# Patient Record
Sex: Female | Born: 1960
Health system: Southern US, Community
[De-identification: ages and names within clinical notes are randomized; demographics above are authoritative.]

## PROBLEM LIST (undated history)

## (undated) DIAGNOSIS — Z9884 Bariatric surgery status: Secondary | ICD-10-CM

## (undated) DIAGNOSIS — K649 Unspecified hemorrhoids: Secondary | ICD-10-CM

## (undated) DIAGNOSIS — IMO0002 Reserved for concepts with insufficient information to code with codable children: Secondary | ICD-10-CM

## (undated) DIAGNOSIS — E079 Disorder of thyroid, unspecified: Secondary | ICD-10-CM

## (undated) DIAGNOSIS — K5790 Diverticulosis of intestine, part unspecified, without perforation or abscess without bleeding: Secondary | ICD-10-CM

## (undated) HISTORY — DX: Reserved for concepts with insufficient information to code with codable children: IMO0002

## (undated) HISTORY — PX: REDUCTION MAMMAPLASTY: SUR839

## (undated) HISTORY — PX: DILATION AND CURETTAGE OF UTERUS: SHX78

## (undated) HISTORY — PX: HEEL SPUR SURGERY: SHX665

## (undated) HISTORY — PX: OTHER SURGICAL HISTORY: SHX169

## (undated) HISTORY — PX: APPENDECTOMY: SHX54

## (undated) HISTORY — DX: Unspecified hemorrhoids: K64.9

## (undated) HISTORY — DX: Disorder of thyroid, unspecified: E07.9

## (undated) HISTORY — PX: TOTAL ABDOMINAL HYSTERECTOMY: SHX209

## (undated) HISTORY — DX: Diverticulosis of intestine, part unspecified, without perforation or abscess without bleeding: K57.90

## (undated) HISTORY — DX: Bariatric surgery status: Z98.84

## (undated) HISTORY — PX: NEUROMA SURGERY: SHX722

---

## 2003-10-03 ENCOUNTER — Ambulatory Visit (HOSPITAL_COMMUNITY): Admission: RE | Admit: 2003-10-03 | Discharge: 2003-10-03 | Payer: Self-pay | Admitting: *Deleted

## 2003-10-05 ENCOUNTER — Ambulatory Visit (HOSPITAL_COMMUNITY): Admission: RE | Admit: 2003-10-05 | Discharge: 2003-10-05 | Payer: Self-pay | Admitting: *Deleted

## 2003-10-23 ENCOUNTER — Encounter: Admission: RE | Admit: 2003-10-23 | Discharge: 2004-01-21 | Payer: Self-pay | Admitting: *Deleted

## 2003-12-10 ENCOUNTER — Inpatient Hospital Stay (HOSPITAL_COMMUNITY): Admission: RE | Admit: 2003-12-10 | Discharge: 2003-12-12 | Payer: Self-pay | Admitting: *Deleted

## 2004-01-22 ENCOUNTER — Encounter: Admission: RE | Admit: 2004-01-22 | Discharge: 2004-04-21 | Payer: Self-pay | Admitting: *Deleted

## 2004-05-01 ENCOUNTER — Ambulatory Visit (HOSPITAL_COMMUNITY): Admission: RE | Admit: 2004-05-01 | Discharge: 2004-05-01 | Payer: Self-pay | Admitting: *Deleted

## 2004-05-20 ENCOUNTER — Ambulatory Visit (HOSPITAL_COMMUNITY): Admission: RE | Admit: 2004-05-20 | Discharge: 2004-05-20 | Payer: Self-pay | Admitting: *Deleted

## 2004-05-27 ENCOUNTER — Ambulatory Visit (HOSPITAL_COMMUNITY): Admission: RE | Admit: 2004-05-27 | Discharge: 2004-05-27 | Payer: Self-pay | Admitting: Surgery

## 2004-05-27 ENCOUNTER — Encounter: Admission: RE | Admit: 2004-05-27 | Discharge: 2004-08-25 | Payer: Self-pay | Admitting: *Deleted

## 2004-09-10 ENCOUNTER — Encounter: Admission: RE | Admit: 2004-09-10 | Discharge: 2004-12-09 | Payer: Self-pay | Admitting: *Deleted

## 2004-12-10 ENCOUNTER — Encounter: Admission: RE | Admit: 2004-12-10 | Discharge: 2005-01-25 | Payer: Self-pay | Admitting: *Deleted

## 2004-12-15 ENCOUNTER — Ambulatory Visit: Payer: Self-pay | Admitting: Family Medicine

## 2005-01-07 ENCOUNTER — Ambulatory Visit: Payer: Self-pay | Admitting: Family Medicine

## 2005-01-07 ENCOUNTER — Other Ambulatory Visit: Admission: RE | Admit: 2005-01-07 | Discharge: 2005-01-07 | Payer: Self-pay | Admitting: Family Medicine

## 2005-01-07 ENCOUNTER — Encounter (INDEPENDENT_AMBULATORY_CARE_PROVIDER_SITE_OTHER): Payer: Self-pay | Admitting: *Deleted

## 2005-01-16 ENCOUNTER — Ambulatory Visit: Payer: Self-pay | Admitting: Gastroenterology

## 2005-01-28 ENCOUNTER — Ambulatory Visit: Payer: Self-pay | Admitting: Gastroenterology

## 2005-01-28 ENCOUNTER — Encounter (INDEPENDENT_AMBULATORY_CARE_PROVIDER_SITE_OTHER): Payer: Self-pay | Admitting: *Deleted

## 2005-01-28 LAB — HM COLONOSCOPY

## 2005-02-03 ENCOUNTER — Ambulatory Visit: Payer: Self-pay | Admitting: Family Medicine

## 2005-03-03 ENCOUNTER — Ambulatory Visit: Payer: Self-pay | Admitting: Family Medicine

## 2005-03-31 ENCOUNTER — Ambulatory Visit: Payer: Self-pay | Admitting: Family Medicine

## 2005-05-26 ENCOUNTER — Encounter: Payer: Self-pay | Admitting: Family Medicine

## 2005-07-13 ENCOUNTER — Ambulatory Visit: Payer: Self-pay | Admitting: Family Medicine

## 2005-07-16 ENCOUNTER — Encounter: Admission: RE | Admit: 2005-07-16 | Discharge: 2005-07-16 | Payer: Self-pay | Admitting: Family Medicine

## 2005-11-03 DIAGNOSIS — F339 Major depressive disorder, recurrent, unspecified: Secondary | ICD-10-CM | POA: Insufficient documentation

## 2005-11-03 DIAGNOSIS — K5732 Diverticulitis of large intestine without perforation or abscess without bleeding: Secondary | ICD-10-CM | POA: Insufficient documentation

## 2005-11-03 DIAGNOSIS — E7800 Pure hypercholesterolemia, unspecified: Secondary | ICD-10-CM | POA: Insufficient documentation

## 2005-11-03 DIAGNOSIS — I8390 Asymptomatic varicose veins of unspecified lower extremity: Secondary | ICD-10-CM | POA: Insufficient documentation

## 2006-01-26 HISTORY — PX: HERNIA REPAIR: SHX51

## 2006-02-04 ENCOUNTER — Encounter: Payer: Self-pay | Admitting: Family Medicine

## 2006-02-04 ENCOUNTER — Ambulatory Visit: Payer: Self-pay | Admitting: Family Medicine

## 2006-02-04 DIAGNOSIS — M25519 Pain in unspecified shoulder: Secondary | ICD-10-CM | POA: Insufficient documentation

## 2006-02-05 ENCOUNTER — Encounter: Payer: Self-pay | Admitting: Family Medicine

## 2006-02-05 ENCOUNTER — Telehealth: Payer: Self-pay | Admitting: Family Medicine

## 2006-02-08 LAB — CONVERTED CEMR LAB
Albumin: 4 g/dL (ref 3.5–5.2)
Alkaline Phosphatase: 102 units/L (ref 39–117)
CO2: 25 meq/L (ref 19–32)
Calcium: 9.1 mg/dL (ref 8.4–10.5)
Chloride: 106 meq/L (ref 96–112)
Glucose, Bld: 85 mg/dL (ref 70–99)
LDL Cholesterol: 114 mg/dL — ABNORMAL HIGH (ref 0–99)
Potassium: 4.3 meq/L (ref 3.5–5.3)
Sodium: 139 meq/L (ref 135–145)
Total Protein: 7 g/dL (ref 6.0–8.3)
Triglycerides: 124 mg/dL (ref <150)

## 2006-02-12 ENCOUNTER — Ambulatory Visit: Payer: Self-pay | Admitting: Family Medicine

## 2006-02-12 ENCOUNTER — Other Ambulatory Visit: Admission: RE | Admit: 2006-02-12 | Discharge: 2006-02-12 | Payer: Self-pay | Admitting: Family Medicine

## 2006-02-12 ENCOUNTER — Encounter (INDEPENDENT_AMBULATORY_CARE_PROVIDER_SITE_OTHER): Payer: Self-pay | Admitting: Specialist

## 2006-02-12 ENCOUNTER — Encounter: Payer: Self-pay | Admitting: Family Medicine

## 2006-02-18 ENCOUNTER — Ambulatory Visit: Payer: Self-pay | Admitting: Family Medicine

## 2006-02-22 ENCOUNTER — Encounter: Payer: Self-pay | Admitting: Family Medicine

## 2006-02-24 ENCOUNTER — Encounter: Payer: Self-pay | Admitting: Family Medicine

## 2006-04-06 ENCOUNTER — Ambulatory Visit: Payer: Self-pay | Admitting: Family Medicine

## 2006-04-06 DIAGNOSIS — J309 Allergic rhinitis, unspecified: Secondary | ICD-10-CM | POA: Insufficient documentation

## 2006-04-23 ENCOUNTER — Telehealth: Payer: Self-pay | Admitting: Family Medicine

## 2006-05-05 ENCOUNTER — Ambulatory Visit: Payer: Self-pay | Admitting: Family Medicine

## 2006-05-06 ENCOUNTER — Ambulatory Visit: Payer: Self-pay | Admitting: Family Medicine

## 2006-05-06 DIAGNOSIS — K4090 Unilateral inguinal hernia, without obstruction or gangrene, not specified as recurrent: Secondary | ICD-10-CM | POA: Insufficient documentation

## 2006-05-10 ENCOUNTER — Ambulatory Visit (HOSPITAL_BASED_OUTPATIENT_CLINIC_OR_DEPARTMENT_OTHER): Admission: RE | Admit: 2006-05-10 | Discharge: 2006-05-10 | Payer: Self-pay | Admitting: Specialist

## 2006-06-30 ENCOUNTER — Ambulatory Visit: Payer: Self-pay | Admitting: Family Medicine

## 2006-06-30 LAB — CONVERTED CEMR LAB
Glucose, Urine, Semiquant: NEGATIVE
Ketones, urine, test strip: NEGATIVE
Nitrite: NEGATIVE
Urobilinogen, UA: 0.2

## 2006-08-31 ENCOUNTER — Ambulatory Visit: Payer: Self-pay | Admitting: Family Medicine

## 2006-09-01 ENCOUNTER — Telehealth (INDEPENDENT_AMBULATORY_CARE_PROVIDER_SITE_OTHER): Payer: Self-pay | Admitting: *Deleted

## 2006-12-01 ENCOUNTER — Ambulatory Visit: Payer: Self-pay | Admitting: Family Medicine

## 2006-12-01 DIAGNOSIS — E049 Nontoxic goiter, unspecified: Secondary | ICD-10-CM | POA: Insufficient documentation

## 2006-12-01 DIAGNOSIS — J328 Other chronic sinusitis: Secondary | ICD-10-CM | POA: Insufficient documentation

## 2006-12-07 ENCOUNTER — Telehealth: Payer: Self-pay | Admitting: Family Medicine

## 2006-12-07 ENCOUNTER — Encounter: Payer: Self-pay | Admitting: Family Medicine

## 2006-12-07 LAB — CONVERTED CEMR LAB
Free T4: 0.92 ng/dL (ref 0.89–1.80)
T3, Free: 2.6 pg/mL (ref 2.3–4.2)

## 2006-12-08 ENCOUNTER — Encounter: Admission: RE | Admit: 2006-12-08 | Discharge: 2006-12-08 | Payer: Self-pay | Admitting: Family Medicine

## 2006-12-09 ENCOUNTER — Telehealth (INDEPENDENT_AMBULATORY_CARE_PROVIDER_SITE_OTHER): Payer: Self-pay | Admitting: *Deleted

## 2007-03-16 ENCOUNTER — Encounter: Payer: Self-pay | Admitting: Family Medicine

## 2007-03-16 ENCOUNTER — Other Ambulatory Visit: Admission: RE | Admit: 2007-03-16 | Discharge: 2007-03-16 | Payer: Self-pay | Admitting: Family Medicine

## 2007-03-16 ENCOUNTER — Ambulatory Visit: Payer: Self-pay | Admitting: Family Medicine

## 2007-03-16 DIAGNOSIS — Z9884 Bariatric surgery status: Secondary | ICD-10-CM | POA: Insufficient documentation

## 2007-03-17 ENCOUNTER — Encounter: Payer: Self-pay | Admitting: Family Medicine

## 2007-03-17 LAB — CONVERTED CEMR LAB
ALT: <8 units/L (ref 0–35)
AST: 16 units/L (ref 0–37)
Albumin: 4.2 g/dL (ref 3.5–5.2)
Calcium: 9.2 mg/dL (ref 8.4–10.5)
Chloride: 108 meq/L (ref 96–112)
Ferritin: 29 ng/mL (ref 10–291)
Hemoglobin: 13 g/dL (ref 12.0–15.0)
Platelets: 269 10*3/uL (ref 150–400)
Potassium: 4.2 meq/L (ref 3.5–5.3)
RDW: 13.4 % (ref 11.5–15.5)
Total Protein: 6.9 g/dL (ref 6.0–8.3)
Vitamin B-12: 484 pg/mL (ref 211–911)

## 2007-03-21 ENCOUNTER — Encounter: Payer: Self-pay | Admitting: Family Medicine

## 2007-03-22 ENCOUNTER — Telehealth (INDEPENDENT_AMBULATORY_CARE_PROVIDER_SITE_OTHER): Payer: Self-pay | Admitting: *Deleted

## 2007-03-22 ENCOUNTER — Encounter: Admission: RE | Admit: 2007-03-22 | Discharge: 2007-03-22 | Payer: Self-pay | Admitting: Family Medicine

## 2007-03-22 ENCOUNTER — Ambulatory Visit: Payer: Self-pay | Admitting: Family Medicine

## 2007-03-22 LAB — CONVERTED CEMR LAB: Pap Smear: NORMAL

## 2007-03-24 ENCOUNTER — Encounter: Admission: RE | Admit: 2007-03-24 | Discharge: 2007-03-24 | Payer: Self-pay | Admitting: Family Medicine

## 2007-04-19 ENCOUNTER — Telehealth: Payer: Self-pay | Admitting: Family Medicine

## 2007-05-31 ENCOUNTER — Ambulatory Visit: Payer: Self-pay | Admitting: Family Medicine

## 2007-06-01 ENCOUNTER — Encounter: Payer: Self-pay | Admitting: Family Medicine

## 2007-06-01 LAB — CONVERTED CEMR LAB
Clue Cells Wet Prep HPF POC: NONE SEEN
Trich, Wet Prep: NONE SEEN

## 2007-06-02 ENCOUNTER — Encounter: Admission: RE | Admit: 2007-06-02 | Discharge: 2007-06-02 | Payer: Self-pay | Admitting: Family Medicine

## 2007-06-02 ENCOUNTER — Ambulatory Visit: Payer: Self-pay | Admitting: Family Medicine

## 2007-06-02 LAB — CONVERTED CEMR LAB
Nitrite: NEGATIVE
Specific Gravity, Urine: 1.01
Urobilinogen, UA: 0.2
pH: 7.5

## 2007-06-03 ENCOUNTER — Encounter: Payer: Self-pay | Admitting: Family Medicine

## 2007-06-03 LAB — CONVERTED CEMR LAB
ALT: 14 units/L (ref 0–35)
AST: 33 units/L (ref 0–37)
Albumin: 3.4 g/dL — ABNORMAL LOW (ref 3.5–5.2)
BUN: 6 mg/dL (ref 6–23)
Calcium: 8.8 mg/dL (ref 8.4–10.5)
Chloride: 102 meq/L (ref 96–112)
HCT: 37.1 % (ref 36.0–46.0)
MCHC: 33.5 g/dL (ref 30.0–36.0)
MCV: 88 fL (ref 78.0–100.0)
Monocytes Relative: 3 % (ref 3–12)
Neutrophils Relative %: 74 % (ref 43–77)
Platelets: 237 10*3/uL (ref 150–400)
Potassium: 3.9 meq/L (ref 3.5–5.3)
RBC: 4.21 M/uL (ref 3.87–5.11)
Sodium: 136 meq/L (ref 135–145)
Total Protein: 6.5 g/dL (ref 6.0–8.3)

## 2007-06-06 ENCOUNTER — Encounter: Admission: RE | Admit: 2007-06-06 | Discharge: 2007-07-15 | Payer: Self-pay | Admitting: Family Medicine

## 2007-06-07 ENCOUNTER — Telehealth (INDEPENDENT_AMBULATORY_CARE_PROVIDER_SITE_OTHER): Payer: Self-pay | Admitting: *Deleted

## 2007-06-10 ENCOUNTER — Encounter: Payer: Self-pay | Admitting: Family Medicine

## 2007-09-12 ENCOUNTER — Encounter: Payer: Self-pay | Admitting: Family Medicine

## 2007-10-24 ENCOUNTER — Telehealth (INDEPENDENT_AMBULATORY_CARE_PROVIDER_SITE_OTHER): Payer: Self-pay | Admitting: *Deleted

## 2007-10-24 ENCOUNTER — Ambulatory Visit: Payer: Self-pay | Admitting: Family Medicine

## 2007-10-24 DIAGNOSIS — K644 Residual hemorrhoidal skin tags: Secondary | ICD-10-CM | POA: Insufficient documentation

## 2007-10-24 DIAGNOSIS — R599 Enlarged lymph nodes, unspecified: Secondary | ICD-10-CM | POA: Insufficient documentation

## 2007-11-02 ENCOUNTER — Ambulatory Visit: Payer: Self-pay | Admitting: Family Medicine

## 2007-11-02 DIAGNOSIS — E538 Deficiency of other specified B group vitamins: Secondary | ICD-10-CM | POA: Insufficient documentation

## 2007-11-03 LAB — CONVERTED CEMR LAB
ALT: 8 units/L (ref 0–35)
AST: 19 units/L (ref 0–37)
Albumin: 4.3 g/dL (ref 3.5–5.2)
Alkaline Phosphatase: 81 units/L (ref 39–117)
Calcium: 9 mg/dL (ref 8.4–10.5)
Chloride: 104 meq/L (ref 96–112)
Hemoglobin: 13.1 g/dL (ref 12.0–15.0)
Platelets: 275 10*3/uL (ref 150–400)
Potassium: 4.7 meq/L (ref 3.5–5.3)
RDW: 13 % (ref 11.5–15.5)
Sodium: 141 meq/L (ref 135–145)
Total Protein: 6.9 g/dL (ref 6.0–8.3)

## 2007-11-07 ENCOUNTER — Ambulatory Visit: Payer: Self-pay | Admitting: Occupational Medicine

## 2007-11-07 ENCOUNTER — Encounter: Payer: Self-pay | Admitting: Emergency Medicine

## 2007-11-08 ENCOUNTER — Inpatient Hospital Stay (HOSPITAL_COMMUNITY): Admission: EM | Admit: 2007-11-08 | Discharge: 2007-11-10 | Payer: Self-pay | Admitting: Internal Medicine

## 2007-11-17 ENCOUNTER — Ambulatory Visit: Payer: Self-pay | Admitting: Family Medicine

## 2007-12-05 ENCOUNTER — Telehealth (INDEPENDENT_AMBULATORY_CARE_PROVIDER_SITE_OTHER): Payer: Self-pay | Admitting: *Deleted

## 2008-01-07 ENCOUNTER — Ambulatory Visit: Payer: Self-pay | Admitting: Occupational Medicine

## 2008-01-07 LAB — CONVERTED CEMR LAB
Glucose, Urine, Semiquant: NEGATIVE
Ketones, urine, test strip: NEGATIVE
Nitrite: NEGATIVE
Protein, U semiquant: 30

## 2008-02-05 ENCOUNTER — Ambulatory Visit: Payer: Self-pay | Admitting: Family Medicine

## 2008-02-05 LAB — CONVERTED CEMR LAB
Glucose, Urine, Semiquant: 100
Ketones, urine, test strip: NEGATIVE
Specific Gravity, Urine: <1.005
Urobilinogen, UA: 1

## 2008-02-12 ENCOUNTER — Ambulatory Visit: Payer: Self-pay | Admitting: Diagnostic Radiology

## 2008-02-12 ENCOUNTER — Emergency Department (HOSPITAL_BASED_OUTPATIENT_CLINIC_OR_DEPARTMENT_OTHER): Admission: EM | Admit: 2008-02-12 | Discharge: 2008-02-12 | Payer: Self-pay | Admitting: Emergency Medicine

## 2008-03-13 ENCOUNTER — Ambulatory Visit: Payer: Self-pay | Admitting: Family Medicine

## 2008-03-31 ENCOUNTER — Ambulatory Visit: Payer: Self-pay | Admitting: Family Medicine

## 2008-04-06 ENCOUNTER — Inpatient Hospital Stay (HOSPITAL_COMMUNITY): Admission: RE | Admit: 2008-04-06 | Discharge: 2008-04-10 | Payer: Self-pay | Admitting: *Deleted

## 2008-04-06 ENCOUNTER — Encounter (INDEPENDENT_AMBULATORY_CARE_PROVIDER_SITE_OTHER): Payer: Self-pay | Admitting: *Deleted

## 2008-07-16 ENCOUNTER — Telehealth (INDEPENDENT_AMBULATORY_CARE_PROVIDER_SITE_OTHER): Payer: Self-pay | Admitting: *Deleted

## 2008-09-28 ENCOUNTER — Ambulatory Visit: Payer: Self-pay | Admitting: Family Medicine

## 2008-10-02 LAB — CONVERTED CEMR LAB
HCT: 37.6 % (ref 36.0–46.0)
MCV: 86 fL (ref 78.0–100.0)
Platelets: 237 10*3/uL (ref 150–400)
RBC: 4.37 M/uL (ref 3.87–5.11)
TSH: 2.332 microintl units/mL (ref 0.350–4.500)
Vit D, 25-Hydroxy: 19 ng/mL — ABNORMAL LOW (ref 30–89)
Vitamin B-12: 209 pg/mL — ABNORMAL LOW (ref 211–911)
WBC: 4.2 10*3/uL (ref 4.0–10.5)

## 2008-10-04 ENCOUNTER — Ambulatory Visit: Payer: Self-pay | Admitting: Family Medicine

## 2008-10-29 ENCOUNTER — Ambulatory Visit: Payer: Self-pay | Admitting: Family Medicine

## 2008-11-04 ENCOUNTER — Ambulatory Visit: Payer: Self-pay | Admitting: Internal Medicine

## 2008-11-26 ENCOUNTER — Ambulatory Visit: Payer: Self-pay | Admitting: Family Medicine

## 2008-11-28 ENCOUNTER — Ambulatory Visit: Payer: Self-pay | Admitting: Family Medicine

## 2008-11-28 ENCOUNTER — Encounter: Admission: RE | Admit: 2008-11-28 | Discharge: 2008-11-28 | Payer: Self-pay | Admitting: Family Medicine

## 2009-01-31 ENCOUNTER — Ambulatory Visit: Payer: Self-pay | Admitting: Family Medicine

## 2009-02-01 LAB — CONVERTED CEMR LAB
ALT: <8 units/L (ref 0–35)
AST: 18 units/L (ref 0–37)
CO2: 22 meq/L (ref 19–32)
Creatinine, Ser: 0.64 mg/dL (ref 0.40–1.20)
LDL Cholesterol: 130 mg/dL — ABNORMAL HIGH (ref 0–99)
Sodium: 141 meq/L (ref 135–145)
Total Bilirubin: 0.6 mg/dL (ref 0.3–1.2)
Total CHOL/HDL Ratio: 3.2
Total Protein: 6.6 g/dL (ref 6.0–8.3)
VLDL: 13 mg/dL (ref 0–40)

## 2009-02-04 ENCOUNTER — Encounter: Admission: RE | Admit: 2009-02-04 | Discharge: 2009-02-04 | Payer: Self-pay | Admitting: Family Medicine

## 2009-02-04 ENCOUNTER — Encounter (INDEPENDENT_AMBULATORY_CARE_PROVIDER_SITE_OTHER): Payer: Self-pay | Admitting: *Deleted

## 2009-02-04 DIAGNOSIS — E041 Nontoxic single thyroid nodule: Secondary | ICD-10-CM | POA: Insufficient documentation

## 2009-02-12 ENCOUNTER — Encounter: Payer: Self-pay | Admitting: Family Medicine

## 2009-04-05 ENCOUNTER — Emergency Department (HOSPITAL_BASED_OUTPATIENT_CLINIC_OR_DEPARTMENT_OTHER): Admission: EM | Admit: 2009-04-05 | Discharge: 2009-04-05 | Payer: Self-pay | Admitting: Emergency Medicine

## 2009-04-05 ENCOUNTER — Ambulatory Visit: Payer: Self-pay | Admitting: Radiology

## 2009-04-17 ENCOUNTER — Ambulatory Visit: Payer: Self-pay | Admitting: Family Medicine

## 2009-04-17 ENCOUNTER — Encounter: Admission: RE | Admit: 2009-04-17 | Discharge: 2009-04-17 | Payer: Self-pay | Admitting: Family Medicine

## 2009-04-18 ENCOUNTER — Encounter: Payer: Self-pay | Admitting: Family Medicine

## 2009-04-18 LAB — CONVERTED CEMR LAB
Vit D, 25-Hydroxy: 19 ng/mL — ABNORMAL LOW (ref 30–89)
Vitamin B-12: 179 pg/mL — ABNORMAL LOW (ref 211–911)

## 2009-04-22 ENCOUNTER — Ambulatory Visit: Payer: Self-pay | Admitting: Family Medicine

## 2009-05-01 ENCOUNTER — Ambulatory Visit: Payer: Self-pay | Admitting: Diagnostic Radiology

## 2009-05-01 ENCOUNTER — Emergency Department (HOSPITAL_BASED_OUTPATIENT_CLINIC_OR_DEPARTMENT_OTHER): Admission: EM | Admit: 2009-05-01 | Discharge: 2009-05-01 | Payer: Self-pay | Admitting: Emergency Medicine

## 2009-05-08 ENCOUNTER — Ambulatory Visit: Payer: Self-pay | Admitting: Family Medicine

## 2009-05-08 DIAGNOSIS — M12819 Other specific arthropathies, not elsewhere classified, unspecified shoulder: Secondary | ICD-10-CM | POA: Insufficient documentation

## 2009-05-08 DIAGNOSIS — M5030 Other cervical disc degeneration, unspecified cervical region: Secondary | ICD-10-CM | POA: Insufficient documentation

## 2009-05-09 ENCOUNTER — Encounter: Payer: Self-pay | Admitting: Family Medicine

## 2009-07-02 ENCOUNTER — Encounter: Payer: Self-pay | Admitting: Family Medicine

## 2009-07-22 ENCOUNTER — Ambulatory Visit (HOSPITAL_COMMUNITY): Admission: RE | Admit: 2009-07-22 | Discharge: 2009-07-23 | Payer: Self-pay | Admitting: Surgery

## 2009-07-22 ENCOUNTER — Encounter (INDEPENDENT_AMBULATORY_CARE_PROVIDER_SITE_OTHER): Payer: Self-pay | Admitting: Surgery

## 2009-07-25 ENCOUNTER — Encounter: Payer: Self-pay | Admitting: Family Medicine

## 2009-07-31 ENCOUNTER — Encounter: Payer: Self-pay | Admitting: Family Medicine

## 2009-08-07 ENCOUNTER — Encounter: Payer: Self-pay | Admitting: Family Medicine

## 2009-08-19 ENCOUNTER — Telehealth: Payer: Self-pay | Admitting: Family Medicine

## 2009-09-04 ENCOUNTER — Encounter: Payer: Self-pay | Admitting: Family Medicine

## 2009-09-26 ENCOUNTER — Ambulatory Visit: Payer: Self-pay | Admitting: Family Medicine

## 2009-09-26 DIAGNOSIS — M25561 Pain in right knee: Secondary | ICD-10-CM | POA: Insufficient documentation

## 2009-09-26 DIAGNOSIS — M25569 Pain in unspecified knee: Secondary | ICD-10-CM

## 2009-10-15 ENCOUNTER — Telehealth: Payer: Self-pay | Admitting: Family Medicine

## 2009-10-17 ENCOUNTER — Encounter: Payer: Self-pay | Admitting: Family Medicine

## 2009-10-17 ENCOUNTER — Encounter: Admission: RE | Admit: 2009-10-17 | Discharge: 2009-10-17 | Payer: Self-pay | Admitting: Sports Medicine

## 2009-10-19 ENCOUNTER — Encounter: Admission: RE | Admit: 2009-10-19 | Discharge: 2009-10-19 | Payer: Self-pay | Admitting: Sports Medicine

## 2009-11-13 ENCOUNTER — Telehealth: Payer: Self-pay | Admitting: Family Medicine

## 2009-11-21 ENCOUNTER — Emergency Department (HOSPITAL_BASED_OUTPATIENT_CLINIC_OR_DEPARTMENT_OTHER): Admission: EM | Admit: 2009-11-21 | Discharge: 2009-11-22 | Payer: Self-pay | Admitting: Emergency Medicine

## 2009-11-21 ENCOUNTER — Ambulatory Visit: Payer: Self-pay | Admitting: Diagnostic Radiology

## 2009-12-15 ENCOUNTER — Ambulatory Visit: Payer: Self-pay | Admitting: Emergency Medicine

## 2009-12-15 ENCOUNTER — Encounter: Payer: Self-pay | Admitting: Family Medicine

## 2009-12-17 ENCOUNTER — Telehealth (INDEPENDENT_AMBULATORY_CARE_PROVIDER_SITE_OTHER): Payer: Self-pay

## 2010-01-28 ENCOUNTER — Encounter
Admission: RE | Admit: 2010-01-28 | Discharge: 2010-01-28 | Payer: Self-pay | Source: Home / Self Care | Attending: Surgery | Admitting: Surgery

## 2010-02-05 ENCOUNTER — Ambulatory Visit
Admission: RE | Admit: 2010-02-05 | Discharge: 2010-02-05 | Payer: Self-pay | Source: Home / Self Care | Attending: Family Medicine | Admitting: Family Medicine

## 2010-02-05 DIAGNOSIS — J45901 Unspecified asthma with (acute) exacerbation: Secondary | ICD-10-CM | POA: Insufficient documentation

## 2010-02-15 ENCOUNTER — Encounter: Payer: Self-pay | Admitting: Surgery

## 2010-02-26 NOTE — Letter (Signed)
Summary: [name]  [name]   Imported By: Lanelle Bal 08/22/2009 09:29:12  _____________________________________________________________________  External Attachment:    Type:   Image     Comment:   External Document

## 2010-02-26 NOTE — Letter (Signed)
Summary: [facility]  [facility]   Imported By: Lanelle Bal 08/16/2009 13:23:12  _____________________________________________________________________  External Attachment:    Type:   Image     Comment:   External Document

## 2010-02-26 NOTE — Letter (Signed)
Summary: Primary Care Consult Scheduled Letter  [place] [facility]  [address]   [place], [place] [address]   Phone: [phone]  Fax: [phone]      02/04/2009 MRN: [id number]  [facility] [address] [place], [place]  [address]    Dear [name],      We have scheduled an appointment for you.  At the recommendation of Dr.Bowen, we have scheduled you a consult with [facility] , Dr Katrinka Blazing  on January 17th  at 11am.  Their address is_[address]. The office phone number is [phone]  If this appointment day and time is not convenient for you, please feel free to call the office of the doctor you are being referred to at the number listed above and reschedule the appointment.     It is important for you to keep your scheduled appointments. We are here to make sure you are given good patient care. If you have questions or you have made changes to your appointment, please notify us at  [phone], ask for [facility].   Thank you,  Patient Care Coordinator [place] [facility]

## 2010-02-26 NOTE — Consult Note (Signed)
Summary: [facility]   Imported By: Lanelle Bal 03/05/2009 11:31:50  _____________________________________________________________________  External Attachment:    Type:   Image     Comment:   External Document

## 2010-02-26 NOTE — Consult Note (Signed)
Summary: [facility]  [facility]   Imported By: Lanelle Bal 05/20/2009 09:26:52  _____________________________________________________________________  External Attachment:    Type:   Image     Comment:   External Document

## 2010-02-26 NOTE — Progress Notes (Signed)
Summary: Increase Wellbutrin dose  Phone Note Call from Patient Call back at Home Phone [phone]   Caller: Patient Call For: Seymour Bars DO Summary of Call: Pt wants toknow if you would increase the Wellbutrin to 300mg  daily- going through some things with her daughter and said ya'll had discussed this at OV. Uses [facility] in [place] Initial call taken by: Kathlene November LPN,  November 13, 2009 10:51 AM    New/Updated Medications: WELLBUTRIN XL 300 MG XR24H-TAB (BUPROPION HCL) 1 tab by mouth daily Prescriptions: WELLBUTRIN XL 300 MG XR24H-TAB (BUPROPION HCL) 1 tab by mouth daily  #30 x 3   Entered and Authorized by:   Seymour Bars DO   Signed by:   Seymour Bars DO on 11/13/2009   Method used:   Electronically to        [facility]  [id number] (retail)       [address]       [place], [place]  [address]       Ph: [phone]       Fax: [phone]   RxID:   [id number]

## 2010-02-26 NOTE — Progress Notes (Signed)
Summary: B12 inj  Phone Note Call from Patient   Caller: Patient Summary of Call: Pt states she has been unable to get B12 inj for several months bc she works M-F 8-5. Pt would like to know if you will write Rx for B12 and she can give to herself at home. Please advise. Initial call taken by: Payton Spark CMA,  August 19, 2009 4:09 PM    New/Updated Medications: CYANOCOBALAMIN 1000 MCG/ML SOLN (CYANOCOBALAMIN) 1,000 micrograms (1 ml) injection IM once a month Prescriptions: CYANOCOBALAMIN 1000 MCG/ML SOLN (CYANOCOBALAMIN) 1,000 micrograms (1 ml) injection IM once a month  #1 ml vial x 5   Entered and Authorized by:   Seymour Bars DO   Signed by:   Seymour Bars DO on 08/19/2009   Method used:   Electronically to        [facility]  [id number] (retail)       [address]       [place], [place]  [address]       Ph: [phone]       Fax: [phone]   RxID:   [id number]   Appended Document: B12 inj Pt aware

## 2010-02-26 NOTE — Assessment & Plan Note (Signed)
Summary: B-12 INJ  Nurse Visit   Vitals Entered By: Payton Spark CMA (April 22, 2009 8:29 AM)  Allergies: 1)  Sulfa  Medication Administration  Injection # 1:    Medication: Vit B12 1000 mcg    Diagnosis: B12 DEFICIENCY (ICD-266.2)    Route: IM    Site: R deltoid    Exp Date: 10/12    Lot #: [id number]    Patient tolerated injection without complications    Given by: Payton Spark CMA (April 22, 2009 8:30 AM)  Orders Added: 1)  Vit B12 1000 mcg [J3420] 2)  Admin of Therapeutic Inj  intramuscular or subcutaneous [96372]   Medication Administration  Injection # 1:    Medication: Vit B12 1000 mcg    Diagnosis: B12 DEFICIENCY (ICD-266.2)    Route: IM    Site: R deltoid    Exp Date: 10/12    Lot #: [id number]    Patient tolerated injection without complications    Given by: Payton Spark CMA (April 22, 2009 8:30 AM)  Orders Added: 1)  Vit B12 1000 mcg [J3420] 2)  Admin of Therapeutic Inj  intramuscular or subcutaneous [[id number]]

## 2010-02-26 NOTE — Letter (Signed)
Summary: [facility]  [facility]   Imported By: Lanelle Bal 08/13/2009 14:40:24  _____________________________________________________________________  External Attachment:    Type:   Image     Comment:   External Document

## 2010-02-26 NOTE — Progress Notes (Signed)
Summary: Courtesy call  Phone Note Outgoing Call   Call placed by: Areta Haber CMA,  December 17, 2009 10:26 AM Call placed to: Patient Summary of Call: Courtesy call to pt - x 16 rings, per recording voice mail unable to receive voice messages. Will try again later in the day. Initial call taken by: Areta Haber CMA,  December 17, 2009 10:27 AM     12/21/09 @ 1:09pm - Courtesy call to pt - Per recording, voicemail unable to except message at this time. Hope pt is feeling better.  Areta Haber CMA  December 21, 2009 1:10 PM

## 2010-02-26 NOTE — Letter (Signed)
Summary: [facility]  [facility]   Imported By: Lanelle Bal 10/31/2009 10:26:56  _____________________________________________________________________  External Attachment:    Type:   Image     Comment:   External Document

## 2010-02-26 NOTE — Letter (Signed)
Summary: [facility]   Imported By: Lanelle Bal 07/18/2009 14:12:00  _____________________________________________________________________  External Attachment:    Type:   Image     Comment:   External Document

## 2010-02-26 NOTE — Assessment & Plan Note (Signed)
Summary: shoulder pain   Vital Signs:  Patient profile:   50 year old female Height:      68 inches Weight:      196 pounds BMI:     29.91 O2 Sat:      98 % on Room air Pulse rate:   67 / minute BP sitting:   111 / 70  (left arm) Cuff size:   regular  Vitals Entered By: Payton Spark CMA (May 08, 2009 8:41 AM)  O2 Flow:  Room air CC: F/U shoulder pain. Not as bad but still having pain.    Primary Care Provider:  Seymour Bars DO  CC:  F/U shoulder pain. Not as bad but still having pain. Marland Kitchen  History of Present Illness: 50 yo WF presents for f/u bilateral shoulder pain.  She was seen 49 wks ago and Xrays showed bilateral AC arthritis.  She has been taking Naproxen which has helped some but she still has mild to moderate pain, worse with shoulder adduction and full flexion.  She has not lost any ROM.    She has chronic neck pain -- improved with chiropractic manipulation but stopped a year ago after her chiropractor died.  She has hx of cervical DDD.  Her neck pain has been triggering HAs which she is waking up with.    She was in the hosp last wk for a bout of diverticulitis, improving but still on abx which has caused vaginal discharge with itching.  She would like to change Nasonex to Flonase due to cost.    She needs to f/u with [facility] after having gastric bypass in the past but Dr Colin Benton left the practice.    Allergies: 1)  Sulfa  Past History:  Past Medical History: c spine DDD [id number] Gastric Bypass diverticulitis hemorrhoids (Dr Colin Benton)  Social History: Reviewed history from 03/31/2008 and no changes required. Separated from  [place].  3 teenage kids. Walks regularly.  Nonsmoker. 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 denies drinking or recreational drug use  Review of Systems      See HPI  Physical Exam  General:  alert, well-developed, well-nourished, well-hydrated, and overweight-appearing.   Head:  normocephalic and atraumatic.   Mouth:   pharynx pink and moist.   Neck:  supple.  limited rotation and SB in both directions with tight trapezious muscles Lungs:  Normal respiratory effort, chest expands symmetrically. Lungs are clear to auscultation, no crackles or wheezes. Heart:  Normal rate and regular rhythm. S1 and S2 normal without gallop, murmur, click, rub or other extra sounds. Abdomen:  soft, non-tender, normal bowel sounds, and no distention.   Msk:  tender over both AC joints with full active shoulder ROM.  + Hawkins test bilat. Pulses:  2+ radial pulses Extremities:  no LE edema Skin:  color normal.   Psych:  good eye contact, not anxious appearing, and not depressed appearing.     Impression & Recommendations:  Problem # 1:  ARTHRITIS, ACROMIOCLAVICULAR (ICD-716.81) Bilat AC arthritis based on xrays in march with marginal improvement on oral NSAIDs.   Refer to sports med for [facility] injections. Orders: Sports Medicine (Sports Med)  Problem # 2:  DISC DISEASE, CERVICAL (ICD-722.4) Triggering HAs with limited ROM and muscle tightness. # given for Dr Arville Care in [place] for chiropractic treatment.  Problem # 3:  ALLERGIC RHINITIS (ICD-477.9) Changed Nasonex to Flonase due to cost. Her updated medication list for this problem includes:    Allegra 180 Mg  Tabs (Fexofenadine hcl) .Marland Kitchen... 1 tab by mouth qd    Fluticasone Propionate 50 Mcg/act Susp (Fluticasone propionate) .Marland Kitchen... 2 sprays/ nostril daily  Problem # 4:  DIVERTICULITIS OF COLON (ICD-562.11) Assessment: Improved Finish out abx given at the hospital.  Treat secondary vaginal candiasis with Fluconazole.  Complete Medication List: 1)  Prozac 40 Mg Caps (Fluoxetine hcl) .Marland Kitchen.. 1 tab by mouth qd 2)  Childrens Multivitamins Chew (Pediatric multiple vitamins) .... Take one daily by mouth 3)  Allegra 180 Mg Tabs (Fexofenadine hcl) .Marland Kitchen.. 1 tab by mouth qd 4)  Fluticasone Propionate 50 Mcg/act Susp (Fluticasone propionate) .... 2 sprays/ nostril daily 5)  Iron 28 Mg Tabs  (Ferrous sulfate) .... Take 1 tablet by mouth once a day 6)  Caltrate 600+d 600-400 Mg-unit Tabs (Calcium carbonate-vitamin d) .... Take two by mouth twice daily 7)  Valtrex 1 Gm Tabs (Valacyclovir hcl) .... 2 tabs by mouth q 12 x 2 dose as needed cold sores 8)  Vitamin D 56387 Unit Caps (Ergocalciferol) .Marland Kitchen.. 1 capsule by mouth 2 x a wk 9)  Wellbutrin Xl 150 Mg Xr24h-tab (Bupropion hcl) .Marland Kitchen.. 1 tab by mouth qam 10)  Naproxen 500 Mg Tabs (Naproxen) .... Take 1 tablet by mouth two times a day 11)  Fluconazole 150 Mg Tabs (Fluconazole) .Marland Kitchen.. 1 tab by mouth x1; repeat in 3 days if needed 12)  Cyclobenzaprine Hcl 10 Mg Tabs (Cyclobenzaprine hcl) .Marland Kitchen.. 1 tab by mouth at bedtime as needed neck pain  Patient Instructions: 1)  Referal made to sports med for [facility] injections. 2)  Call Dr Arville Care re: chiropractic treatment for cervical disc dz. 3)  Use Cyclobenzaprine at night for muscle relaxer. 4)  Fluconazole RX given. 5)  Nasonex changed to Fluticasone nasal for cost. 6)  [facility]:  Dr Carolynne Edouard or Dr Abbey Chatters.  Call if you need a referral. Prescriptions: CYCLOBENZAPRINE HCL 10 MG TABS (CYCLOBENZAPRINE HCL) 1 tab by mouth at bedtime as needed neck pain  #30 x 1   Entered and Authorized by:   Seymour Bars DO   Signed by:   Seymour Bars DO on 05/08/2009   Method used:   Electronically to        [facility]  [id number] (retail)       [address]       [place], [place]  [address]       Ph: [phone]       Fax: [phone]   RxID:   [id number] FLUCONAZOLE 150 MG TABS (FLUCONAZOLE) 1 tab by mouth x1; repeat in 3 days if needed  #2 tabs x 0   Entered and Authorized by:   Seymour Bars DO   Signed by:   Seymour Bars DO on 05/08/2009   Method used:   Electronically to        [facility]  [id number] (retail)       [address]       [place], [place]  [address]       Ph: [phone]       Fax: [phone]   RxID:   [id number] FLUTICASONE PROPIONATE 50 MCG/ACT SUSP (FLUTICASONE PROPIONATE) 2 sprays/  nostril daily  #1 bottle x 6   Entered and Authorized by:   Seymour Bars DO   Signed by:   Seymour Bars DO on 05/08/2009   Method used:   Electronically to        [facility]  [address] [phone]* (retail)  [address]       [place], [place]  [address]       Ph: [phone]       Fax: [phone]   RxID:   [id number]

## 2010-02-26 NOTE — Assessment & Plan Note (Signed)
Summary: R knee pain   Vital Signs:  Patient profile:   50 year old female Height:      68 inches Weight:      201 pounds BMI:     30.67 O2 Sat:      99 % on Room air Pulse rate:   74 / minute BP sitting:   100 / 69  (left arm) Cuff size:   large  Vitals Entered By: Payton Spark CMA (September 26, 2009 9:24 AM)  O2 Flow:  Room air CC: R knee pain and swelling x 1 week.   Primary Care Provider:  Seymour Bars DO  CC:  R knee pain and swelling x 1 week.Marland Kitchen  History of Present Illness: [name] is a 50 year old female with R knee pain of one week duration.  She claims it began when she squatted to pick something up from the lower shelf at work she felt a sharp pain in her right knee.  The pain is the most severe on the back of her knee and on the lateral aspect of her right knee, but when she stands the entire knee is painful.  Now she claims the pain is so bad it wakes her up at night and alleve and ice do provide some relief, but for only 2 to 4 hours.  Also walking down the stairs is very painful and makes it worse in addition to walking or standing for long periods of time.    The patient denies any injuries or surgeries with the right knee. Denies redness or swelling.  Current Medications (verified): 1)  Prozac 40 Mg Caps (Fluoxetine Hcl) .Marland Kitchen.. 1 Tab By Mouth Qd 2)  Childrens Multivitamins  Chew (Pediatric Multiple Vitamins) .... Take One Daily By Mouth 3)  Allegra 180 Mg Tabs (Fexofenadine Hcl) .Marland Kitchen.. 1 Tab By Mouth Qd 4)  Fluticasone Propionate 50 Mcg/act Susp (Fluticasone Propionate) .... 2 Sprays/ Nostril Daily 5)  Iron 28 Mg  Tabs (Ferrous Sulfate) .... Take 1 Tablet By Mouth Once A Day 6)  Caltrate 600+d 600-400 Mg-Unit  Tabs (Calcium Carbonate-Vitamin D) .... Take Two By Mouth Twice Daily 7)  Valtrex 1 Gm  Tabs (Valacyclovir Hcl) .... 2 Tabs By Mouth Q 12 X 2 Dose As Needed Cold Sores 8)  Vitamin D 16109 Unit Caps (Ergocalciferol) .Marland Kitchen.. 1 Capsule By Mouth 2 X A Wk 9)   Wellbutrin Xl 150 Mg Xr24h-Tab (Bupropion Hcl) .Marland Kitchen.. 1 Tab By Mouth Qam 10)  Naproxen 500 Mg Tabs (Naproxen) .... Take 1 Tablet By Mouth Two Times A Day 11)  Fluconazole 150 Mg Tabs (Fluconazole) .Marland Kitchen.. 1 Tab By Mouth X1; Repeat in 3 Days If Needed 12)  Cyclobenzaprine Hcl 10 Mg Tabs (Cyclobenzaprine Hcl) .Marland Kitchen.. 1 Tab By Mouth At Bedtime As Needed Neck Pain 13)  Cyanocobalamin 1000 Mcg/ml Soln (Cyanocobalamin) .... 1,000 Micrograms (1 Ml) Injection Im Once A Month  Allergies (verified): 1)  Sulfa  Past History:  Past Medical History: Reviewed history from 05/08/2009 and no changes required. c spine DDD [id number] Gastric Bypass diverticulitis hemorrhoids (Dr Colin Benton)  Past Surgical History: Reviewed history from 03/31/2008 and no changes required. Appendectomy conization D&C, heel spur R/L L neuroma surgery lap gastric bypass LTCSx 3 TAH w/o oophorectomy/ (non-cancerous) R inguinal hernia 2008  Dr Colin Benton gen surgery having sigmoid colectomy for diverticulitis in march 2010  Social History: Reviewed history from 03/31/2008 and no changes required. Separated from  [place].  3 teenage kids. Walks regularly.  Nonsmoker. 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 denies drinking or recreational drug use  Review of Systems      See HPI  Physical Exam  General:  alert, well-developed, well-nourished, and well-hydrated.   Msk:  No edema or ertheyma noted around the knee. There is joint tenderness on palpation above the patella and around the head of the fibula.  + R knee McMurray test at lateral joint line.   Significant pain when palpating the knee joint space. Negative anterior drawer and posterior drawer tests.   Neurologic:  strength normal in all extremities.   Skin:  no bruising, redness or heat   Knee Exam  Gait:    limp noted-right.    Inspection:     No deformity, ecchymosis or swelling.   Palpation:    tenderness R-lateral joint line.    Reflexes:     Normal and symmetric patellar and Achilles reflexes bilaterally.    Knee Exam:    Right:    Inspection:  Normal    Palpation:  Abnormal       Location:  lateral collateral    Range of Motion:       Flexion-Active: full       Extension-Active: full       Flexion-Passive: full       Extension-Passive: full    Full range of motion but with pain  Anterior drawer:    Right negative Posterior drawer:    Right negative   Impression & Recommendations:  Problem # 1:  KNEE PAIN, RIGHT, ACUTE (ICD-719.46) Likely lateral menical tear given symptoms and PE findings.  Will treat with Ibuprofen 800 mg three times a day with food, a R knee sleeve and ice.  Avoid squatting and pivotting.  If not improved in 10 days, call and will get her back in with [facility]. Her updated medication list for this problem includes:    Naproxen 500 Mg Tabs (Naproxen) .Marland Kitchen... Take 1 tablet by mouth two times a day    Cyclobenzaprine Hcl 10 Mg Tabs (Cyclobenzaprine hcl) .Marland Kitchen... 1 tab by mouth at bedtime as needed neck pain    Ibuprofen 800 Mg Tabs (Ibuprofen) .Marland Kitchen... 1 tab by mouth three times a day with food as needed for knee pain  Complete Medication List: 1)  Prozac 40 Mg Caps (Fluoxetine hcl) .Marland Kitchen.. 1 tab by mouth qd 2)  Childrens Multivitamins Chew (Pediatric multiple vitamins) .... Take one daily by mouth 3)  Allegra 180 Mg Tabs (Fexofenadine hcl) .Marland Kitchen.. 1 tab by mouth qd 4)  Fluticasone Propionate 50 Mcg/act Susp (Fluticasone propionate) .... 2 sprays/ nostril daily 5)  Iron 28 Mg Tabs (Ferrous sulfate) .... Take 1 tablet by mouth once a day 6)  Caltrate 600+d 600-400 Mg-unit Tabs (Calcium carbonate-vitamin d) .... Take two by mouth twice daily 7)  Valtrex 1 Gm Tabs (Valacyclovir hcl) .... 2 tabs by mouth q 12 x 2 dose as needed cold sores 8)  Vitamin D 16109 Unit Caps (Ergocalciferol) .Marland Kitchen.. 1 capsule by mouth 2 x a wk 9)  Wellbutrin Xl 150 Mg Xr24h-tab (Bupropion hcl) .Marland Kitchen.. 1 tab by mouth qam 10)  Naproxen 500 Mg  Tabs (Naproxen) .... Take 1 tablet by mouth two times a day 11)  Fluconazole 150 Mg Tabs (Fluconazole) .Marland Kitchen.. 1 tab by mouth x1; repeat in 3 days if needed 12)  Cyclobenzaprine Hcl 10 Mg Tabs (Cyclobenzaprine hcl) .Marland Kitchen.. 1 tab by mouth at bedtime as needed neck pain 13)  Cyanocobalamin 1000 Mcg/ml  Soln (Cyanocobalamin) .... 1,000 micrograms (1 ml) injection im once a month 14)  Ibuprofen 800 Mg Tabs (Ibuprofen) .Marland Kitchen.. 1 tab by mouth three times a day with food as needed for knee pain 15)  Fluconazole 150 Mg Tabs (Fluconazole) .Marland Kitchen.. 1 tab by mouth x 1  Patient Instructions: 1)  Use RX ibuprofen 3 x a day with food for knee pain/ inflammation. 2)  Purchase an OTC R knee brace/ sleeve to wear during the day for comfort. 3)  Use Ice for 15 min 2-3 x a day for the next 5 days. 4)  If not improving in 7-10 days, call and I will get you in with [facility].   Prescriptions: FLUCONAZOLE 150 MG TABS (FLUCONAZOLE) 1 tab by mouth x 1  #1 tab x 0   Entered and Authorized by:   Seymour Bars DO   Signed by:   Seymour Bars DO on 09/26/2009   Method used:   Electronically to        [facility]  [address] (retail)       [address]       [place], [place]  [address]       Ph: [phone]       Fax: [phone]   RxID:   [id number] IBUPROFEN 800 MG TABS (IBUPROFEN) 1 tab by mouth three times a day with food as needed for knee pain  #60 x 0   Entered and Authorized by:   Seymour Bars DO   Signed by:   Seymour Bars DO on 09/26/2009   Method used:   Electronically to        [facility]  [address] (retail)       [address]       [place], [place]  [address]       Ph: [phone]       Fax: [phone]   RxID:   [id number]

## 2010-02-26 NOTE — Assessment & Plan Note (Signed)
Summary: f/u depression   Vital Signs:  Patient profile:   50 year old female Height:      68 inches Weight:      193 pounds BMI:     29.45 O2 Sat:      98 % on Room air Pulse rate:   72 / minute BP sitting:   107 / 63  (left arm) Cuff size:   regular  Vitals Entered By: Payton Spark CMA (January 31, 2009 8:16 AM)  O2 Flow:  Room air CC: F/U.    Primary Care Provider:  Seymour Bars DO  CC:  F/U. Marland Kitchen  History of Present Illness: 50 yo WF presents for f/u depression and labs.  She is due for fasting labs.  Her B12 and Vitamin D levels are due in March.  She was low in the Fall and is on B12 injections and Rx Vitamin D once a wk.    She keeps up with her supplements s/p gastric bypass.  Her weight is up another 10 lbs.  She went thru a divorce this past year and is working full time at the pharmacy.  Admits to not making the time to exercise.  Watching her diet.  Mood is stable.  Seeing counselor on and off.  Happy with current doses of medicaitons.  Current Medications (verified): 1)  Prozac 40 Mg Caps (Fluoxetine Hcl) .Marland Kitchen.. 1 Tab By Mouth Qd 2)  Childrens Multivitamins  Chew (Pediatric Multiple Vitamins) .... Take One Daily By Mouth 3)  Allegra 180 Mg Tabs (Fexofenadine Hcl) .Marland Kitchen.. 1 Tab By Mouth Qd 4)  Nasonex 50 Mcg/act Susp (Mometasone Furoate) .... 2 Sprays Per Nostril Daily 5)  Vitamin B-12 250 Mcg  Tabs (Cyanocobalamin) .... Take 1 Tablet By Mouth Once A Day 6)  Iron 28 Mg  Tabs (Ferrous Sulfate) .... Take 1 Tablet By Mouth Once A Day 7)  Caltrate 600+d 600-400 Mg-Unit  Tabs (Calcium Carbonate-Vitamin D) .... Take Two By Mouth Twice Daily 8)  Valtrex 1 Gm  Tabs (Valacyclovir Hcl) .... 2 Tabs By Mouth Q 12 X 2 Dose As Needed Cold Sores 9)  Vitamin D 36644 Unit Caps (Ergocalciferol) .Marland Kitchen.. 1 Capsule By Mouth Once A Wk 10)  Wellbutrin Xl 150 Mg Xr24h-Tab (Bupropion Hcl) .Marland Kitchen.. 1 Tab By Mouth Qam  Allergies (verified): 1)  Sulfa  Past History:  Past Medical History: Reviewed  history from 10/29/2008 and no changes required. c spine DDD [id number] hx of bariatric surgery diverticulitis hemorrhoids (Dr Colin Benton)  Past Surgical History: Reviewed history from 03/31/2008 and no changes required. Appendectomy conization D&C, heel spur R/L L neuroma surgery lap gastric bypass LTCSx 3 TAH w/o oophorectomy/ (non-cancerous) R inguinal hernia 2008  Dr Colin Benton gen surgery having sigmoid colectomy for diverticulitis in march 2010  Social History: Reviewed history from 03/31/2008 and no changes required. Separated from  [place].  3 teenage kids. Walks regularly.  Nonsmoker. 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 denies drinking or recreational drug use  Review of Systems      See HPI  Physical Exam  General:  alert, well-developed, well-nourished, and well-hydrated.   Head:  normocephalic and atraumatic.   Nose:  no nasal discharge.   Mouth:  good dentition and pharynx pink and moist.   Neck:  symetric thyromegaly w/o palpable nodules.  moves freely with swallowing Lungs:  Normal respiratory effort, chest expands symmetrically. Lungs are clear to auscultation, no crackles or wheezes. Heart:  Normal rate and regular rhythm.  S1 and S2 normal without gallop, murmur, click, rub or other extra sounds. Extremities:  no E/C/C Skin:  color normal.   Psych:  good eye contact, not anxious appearing, and not depressed appearing.     Impression & Recommendations:  Problem # 1:  DEPRESSION, MAJOR, RECURRENT (ICD-296.30) Stable on current meds + counseling. F/U in 6 mos.  Problem # 2:  THYROMEGALY (ICD-240.9) Recheck Thyroid u/s. TSH normal in Sept 2010.  Had small nodules on u/s 11-08.   Orders: T-*Unlisted Diagnostic X-ray test/procedure (16109)  Problem # 3:  BARIATRIC SURGERY STATUS (ICD-V45.86) She has f/u with [facility] but Dr Colin Benton has left the practice. She has gained 10 # since separation from husband/ working full time. We discussed the need to get  back to regular exercise and to stay on supplements.  Complete Medication List: 1)  Prozac 40 Mg Caps (Fluoxetine hcl) .Marland Kitchen.. 1 tab by mouth qd 2)  Childrens Multivitamins Chew (Pediatric multiple vitamins) .... Take one daily by mouth 3)  Allegra 180 Mg Tabs (Fexofenadine hcl) .Marland Kitchen.. 1 tab by mouth qd 4)  Nasonex 50 Mcg/act Susp (Mometasone furoate) .... 2 sprays per nostril daily 5)  Iron 28 Mg Tabs (Ferrous sulfate) .... Take 1 tablet by mouth once a day 6)  Caltrate 600+d 600-400 Mg-unit Tabs (Calcium carbonate-vitamin d) .... Take two by mouth twice daily 7)  Valtrex 1 Gm Tabs (Valacyclovir hcl) .... 2 tabs by mouth q 12 x 2 dose as needed cold sores 8)  Vitamin D 60454 Unit Caps (Ergocalciferol) .Marland Kitchen.. 1 capsule by mouth once a wk 9)  Wellbutrin Xl 150 Mg Xr24h-tab (Bupropion hcl) .Marland Kitchen.. 1 tab by mouth qam  Other Orders: T-Comprehensive Metabolic Panel [phone]) T-Lipid Profile (29562-13086) Vit B12 1000 mcg (V7846) Admin of Therapeutic Inj  intramuscular or subcutaneous (96295)  Patient Instructions: 1)  Repeat Vitamin D and B12 level in March. 2)  Have labs drawn today. 3)  Will call you with results tomorrow. 4)  Stay on current meds. 5)  REturn for f/u in 6 mos.   Medication Administration  Injection # 1:    Medication: Vit B12 1000 mcg    Diagnosis: B12 DEFICIENCY (ICD-266.2)    Route: IM    Site: LUOQ gluteus    Mfr: [employer]    Patient tolerated injection without complications    Given by: Payton Spark CMA (January 31, 2009 9:07 AM)  Orders Added: 1)  T-*Unlisted Diagnostic X-ray test/procedure [28413] 2)  T-Comprehensive Metabolic Panel [80053-22900] 3)  T-Lipid Profile [80061-22930] 4)  Est. Patient Level III [24401] 5)  Vit B12 1000 mcg [J3420] 6)  Admin of Therapeutic Inj  intramuscular or subcutaneous [02725]

## 2010-02-26 NOTE — Assessment & Plan Note (Signed)
Summary: UTI?/TM   Vital Signs:  Patient Profile:   50 Years Old Female CC:      Polyuria, Dysuria x 2 days Height:     68 inches (170.18 cm) Weight:      201 pounds O2 Sat:      100 % O2 treatment:    Room Air Temp:     99.1 degrees F oral Pulse rate:   87 / minute Pulse rhythm:   regular Resp:     12 per minute BP sitting:   120 / 75  (left arm) Cuff size:   large  Vitals Entered By: Emilio Math (December 15, 2009 12:31 PM)                  Current Allergies (reviewed today): SULFAHistory of Present Illness Chief Complaint: Polyuria, Dysuria x 2 days History of Present Illness: Patient complains of UTI symptoms for 4 days.  She describes the pain as burning during urination.  She has not used any OTC meds. + dysuria + frequency + urgency No hematuria No vaginal discharge No fever/chills No lower abdomenal pain No back pain No fatigue   Current Meds PROZAC 40 MG CAPS (FLUOXETINE HCL) 1 tab by mouth qd IRON 28 MG  TABS (FERROUS SULFATE) Take 1 tablet by mouth once a day CALTRATE 600+D 600-400 MG-UNIT  TABS (CALCIUM CARBONATE-VITAMIN D) take two by mouth twice daily VITAMIN D 04540 UNIT CAPS (ERGOCALCIFEROL) 1 capsule by mouth 2 x a wk WELLBUTRIN XL 300 MG XR24H-TAB (BUPROPION HCL) 1 tab by mouth daily CYANOCOBALAMIN 1000 MCG/ML SOLN (CYANOCOBALAMIN) 1,000 micrograms (1 ml) injection IM once a month IBUPROFEN 800 MG TABS (IBUPROFEN) 1 tab by mouth three times a day with food as needed for knee pain CIPROFLOXACIN HCL 250 MG TABS (CIPROFLOXACIN HCL) 1 tab by mouth two times a day for 5 days  REVIEW OF SYSTEMS Constitutional Symptoms      Denies fever, chills, night sweats, weight loss, weight gain, and fatigue.  Eyes       Denies change in vision, eye pain, eye discharge, glasses, contact lenses, and eye surgery. Ear/Nose/Throat/Mouth       Denies hearing loss/aids, change in hearing, ear pain, ear discharge, dizziness, frequent runny nose, frequent nose bleeds,  sinus problems, sore throat, hoarseness, and tooth pain or bleeding.  Respiratory       Denies dry cough, productive cough, wheezing, shortness of breath, asthma, bronchitis, and emphysema/COPD.  Cardiovascular       Denies murmurs, chest pain, and tires easily with exhertion.    Gastrointestinal       Denies stomach pain, nausea/vomiting, diarrhea, constipation, blood in bowel movements, and indigestion. Genitourniary       Complains of painful urination.      Denies kidney stones and loss of urinary control. Neurological       Denies paralysis, seizures, and fainting/blackouts. Musculoskeletal       Denies muscle pain, joint pain, joint stiffness, decreased range of motion, redness, swelling, muscle weakness, and gout.  Skin       Denies bruising, unusual mles/lumps or sores, and hair/skin or nail changes.  Psych       Denies mood changes, temper/anger issues, anxiety/stress, speech problems, depression, and sleep problems.  Past History:  Past Medical History: Reviewed history from 05/08/2009 and no changes required. c spine DDD [id number] Gastric Bypass diverticulitis hemorrhoids (Dr Colin Benton)  Past Surgical History: Reviewed history from 03/31/2008 and no changes required. Appendectomy conization D&C, heel spur  R/L L neuroma surgery lap gastric bypass LTCSx 3 TAH w/o oophorectomy/ (non-cancerous) R inguinal hernia 2008  Dr Colin Benton gen surgery having sigmoid colectomy for diverticulitis in march 2010  Family History: Reviewed history from 02/04/2006 and no changes required. father- alive, HTN, DM  mother healthy, sister and brother healthy Physical Exam General appearance: well developed, well nourished, no acute distress Chest/Lungs: no rales, wheezes, or rhonchi bilateral, breath sounds equal without effort Heart: regular rate and  rhythm, no murmur Abdomen: soft, non-tender without obvious organomegaly Back: no CVAT Skin: no obvious rashes or lesions MSE:  oriented to time, place, and person Assessment New Problems: URINARY TRACT INFECTION (ICD-599.0)   Patient Education: Patient and/or caregiver instructed in the following: rest, fluids, Ibuprofen prn.  Plan New Medications/Changes: CIPROFLOXACIN HCL 250 MG TABS (CIPROFLOXACIN HCL) 1 tab by mouth two times a day for 5 days  #10 x 0, 12/15/2009, Hoyt Koch MD  New Orders: New Patient Level III [phone] UA Dipstick w/o Micro (automated)  [81003] T-Culture, Urine [[id number]] Planning Comments:   Increase clear fluids, cranberry juice Urine culture is pending Ibuprofen PRN   The patient and/or caregiver has been counseled thoroughly with regard to medications prescribed including dosage, schedule, interactions, rationale for use, and possible side effects and they verbalize understanding.  Diagnoses and expected course of recovery discussed and will return if not improved as expected or if the condition worsens. Patient and/or caregiver verbalized understanding.  Prescriptions: CIPROFLOXACIN HCL 250 MG TABS (CIPROFLOXACIN HCL) 1 tab by mouth two times a day for 5 days  #10 x 0   Entered and Authorized by:   Hoyt Koch MD   Signed by:   Hoyt Koch MD on 12/15/2009   Method used:   Print then Give to Patient   RxID:   [id number]   Orders Added: 1)  New Patient Level III [32440] 2)  UA Dipstick w/o Micro (automated)  [81003] 3)  T-Culture, Urine [[id number]]  Appended Document: UTI?/TM UA: GLU: Neg BIL: Neg KET: Neg SG: 1.020 BLO: 2+ pH: 5.5 PRO: Neg URO: 0.2 NIT: Neg LEU:1+

## 2010-02-26 NOTE — Letter (Signed)
Summary: [facility]  [facility]   Imported By: Lanelle Bal 09/12/2009 12:14:15  _____________________________________________________________________  External Attachment:    Type:   Image     Comment:   External Document

## 2010-02-26 NOTE — Progress Notes (Signed)
Summary: Knee pain  Phone Note Call from Patient   Caller: Patient [phone] Summary of Call: Pt calls back bc knee is still painful and she has been doing everything you recommended. Please advise. Initial call taken by: Payton Spark CMA,  October 15, 2009 12:06 PM  Follow-up for Phone Call        Pls schedule appt with [facility] as soon as possible. Follow-up by: Seymour Bars DO,  October 15, 2009 12:33 PM     Appended Document: Knee pain Victorino Dike- Can you please scheduled this Pt as Dr. B advised above?   Appended Document: Knee pain scheduled pt for Dr.Draper on Thursday 10/17/09 at 2:30 here in [address] and pt is aware.

## 2010-02-26 NOTE — Assessment & Plan Note (Signed)
Summary: Shoulder Pain   Vital Signs:  Patient profile:   50 year old female Height:      68 inches Weight:      198 pounds Pulse rate:   73 / minute BP sitting:   105 / 61  (left arm) Cuff size:   regular  Vitals Entered By: Kathlene November (April 17, 2009 3:54 PM) CC: left shoulder pain x 1 year   Primary Care Provider:  Seymour Bars DO  CC:  left shoulder pain x 1 year.  History of Present Illness: left shoulder pain x 1 year.  Really bilat but left is wrose than the right.  Painful to sleep on her shoulders. Feels like it will freezw up.  Feel slike a hot poker on the top of her shoulder. feels her ROM is limited and painful with internal rotation. No old injuries. No OTC medicines.  Feels frustrated by this. No hx of OA. Never feels hot or swollen.  No weakness.  Occ cracking and popping.  No locking. No xrays.   Current Medications (verified): 1)  Prozac 40 Mg Caps (Fluoxetine Hcl) .Marland Kitchen.. 1 Tab By Mouth Qd 2)  Childrens Multivitamins  Chew (Pediatric Multiple Vitamins) .... Take One Daily By Mouth 3)  Allegra 180 Mg Tabs (Fexofenadine Hcl) .Marland Kitchen.. 1 Tab By Mouth Qd 4)  Nasonex 50 Mcg/act Susp (Mometasone Furoate) .... 2 Sprays Per Nostril Daily 5)  Iron 28 Mg  Tabs (Ferrous Sulfate) .... Take 1 Tablet By Mouth Once A Day 6)  Caltrate 600+d 600-400 Mg-Unit  Tabs (Calcium Carbonate-Vitamin D) .... Take Two By Mouth Twice Daily 7)  Valtrex 1 Gm  Tabs (Valacyclovir Hcl) .... 2 Tabs By Mouth Q 12 X 2 Dose As Needed Cold Sores 8)  Vitamin D 21308 Unit Caps (Ergocalciferol) .Marland Kitchen.. 1 Capsule By Mouth Once A Wk 9)  Wellbutrin Xl 150 Mg Xr24h-Tab (Bupropion Hcl) .Marland Kitchen.. 1 Tab By Mouth Qam  Allergies (verified): 1)  Sulfa  Comments:  Nurse/Medical Assistant: [name] medications and allergies were reviewed with the patient and were updated in the Medication and Allergy Lists. Kathlene November (April 17, 2009 3:54 PM)  Physical Exam  General:  Well-developed,well-nourished,in no acute  distress; alert,appropriate and cooperative throughout examination Msk:  Bilat shoulder with nor rash or swelling. Left shoulder with NROM. Pain with full extension and internal rotation and crossover.  Tender over teh anterior shoulder edge and mild tendernss over teh lateral edge.  Slightly weaker than  right with empty can test. No dislocation. Right shoulder is more tender over teh lasteral shoulder edge, NROM but pain with internal rotation and with crossover.     Impression & Recommendations:  Problem # 1:  SHOULDER PAIN (ICD-719.41) I really think she has a shoulder bursitis on teh right and possible a tendinopathy or cartilage tear on the left. Will get xrays to look for OA or signs of impingement. No trauam. Will treat with NSAIDs as needed. Take with fooda nd water.  Also given exercises for bursitis and RCC to do at home. Pt feels wouldn't have time for formal PT at this time.  Her updated medication list for this problem includes:    Naproxen 500 Mg Tabs (Naproxen) .Marland Kitchen... Take 1 tablet by mouth two times a day  Orders: T-DG Shoulder*L* (73030) T-DG Shoulder*R* (65784)  Problem # 2:  B12 DEFICIENCY (ICD-266.2) Due to recheck level to see if still needs injections.  Orders: T-Vitamin B12 (69629-52841)  Complete Medication List: 1)  Prozac 40  Mg Caps (Fluoxetine hcl) .Marland Kitchen.. 1 tab by mouth qd 2)  Childrens Multivitamins Chew (Pediatric multiple vitamins) .... Take one daily by mouth 3)  Allegra 180 Mg Tabs (Fexofenadine hcl) .Marland Kitchen.. 1 tab by mouth qd 4)  Nasonex 50 Mcg/act Susp (Mometasone furoate) .... 2 sprays per nostril daily 5)  Iron 28 Mg Tabs (Ferrous sulfate) .... Take 1 tablet by mouth once a day 6)  Caltrate 600+d 600-400 Mg-unit Tabs (Calcium carbonate-vitamin d) .... Take two by mouth twice daily 7)  Valtrex 1 Gm Tabs (Valacyclovir hcl) .... 2 tabs by mouth q 12 x 2 dose as needed cold sores 8)  Vitamin D 16109 Unit Caps (Ergocalciferol) .Marland Kitchen.. 1 capsule by mouth once a wk 9)   Wellbutrin Xl 150 Mg Xr24h-tab (Bupropion hcl) .Marland Kitchen.. 1 tab by mouth qam 10)  Naproxen 500 Mg Tabs (Naproxen) .... Take 1 tablet by mouth two times a day  Other Orders: T-Vitamin D (25-Hydroxy) (60454-09811) Prescriptions: NAPROXEN 500 MG TABS (NAPROXEN) Take 1 tablet by mouth two times a day  #60 x 0   Entered and Authorized by:   Nani Gasser MD   Signed by:   Nani Gasser MD on 04/17/2009   Method used:   Electronically to        [facility]  [id number]* (retail)       [address]       [place], [place]  [address]       Ph: [phone]       Fax: [phone]   RxID:   [id number]

## 2010-02-27 NOTE — Assessment & Plan Note (Signed)
Summary: asthma flare   Vital Signs:  Patient profile:   50 year old female Height:      68 inches Weight:      205 pounds BMI:     31.28 O2 Sat:      100 % on Room air Temp:     97.8 degrees F oral Pulse rate:   76 / minute BP sitting:   109 / 71  (left arm) Cuff size:   large  Vitals Entered By: Payton Spark CMA (February 05, 2010 1:17 PM)  O2 Flow:  Room air CC: Chest congestion x 3 days. Requests inhaler.   Primary Care Provider:  Seymour Bars DO  CC:  Chest congestion x 3 days. Requests inhaler..  History of Present Illness: 50 yo WF presents for a cough that started 2-3 days ago.  Most of her cough is dry.  She has a hx of asthma.  She ran out of her inhaler.  She has not needed an inhaler in years.  She has chest tightness with some mild dyspnea.  Denies runny nose, sore throat, fevers or chills.  Denies nighttime cough or allergy symptoms.    She is taking sudafed but this has not done much.     Current Medications (verified): 1)  Prozac 40 Mg Caps (Fluoxetine Hcl) .Marland Kitchen.. 1 Tab By Mouth Qd 2)  Iron 28 Mg  Tabs (Ferrous Sulfate) .... Take 1 Tablet By Mouth Once A Day 3)  Caltrate 600+d 600-400 Mg-Unit  Tabs (Calcium Carbonate-Vitamin D) .... Take Two By Mouth Twice Daily 4)  Vitamin D 91478 Unit Caps (Ergocalciferol) .Marland Kitchen.. 1 Capsule By Mouth 2 X A Wk 5)  Wellbutrin Xl 300 Mg Xr24h-Tab (Bupropion Hcl) .Marland Kitchen.. 1 Tab By Mouth Daily 6)  Cyanocobalamin 1000 Mcg/ml Soln (Cyanocobalamin) .... 1,000 Micrograms (1 Ml) Injection Im Once A Month 7)  Ibuprofen 800 Mg Tabs (Ibuprofen) .Marland Kitchen.. 1 Tab By Mouth Three Times A Day With Food As Needed For Knee Pain  Allergies (verified): 1)  Sulfa  Past History:  Past Medical History: c spine DDD [id number] Gastric Bypass diverticulitis hemorrhoids (Dr Colin Benton) mild intermittent asthma  Past Surgical History: Reviewed history from 03/31/2008 and no changes required. Appendectomy conization D&C, heel spur R/L L neuroma surgery lap  gastric bypass LTCSx 3 TAH w/o oophorectomy/ (non-cancerous) R inguinal hernia 2008  Dr Colin Benton gen surgery having sigmoid colectomy for diverticulitis in march 2010  Social History: Reviewed history from 03/31/2008 and no changes required. Separated from  [place].  3 teenage kids. Walks regularly.  Nonsmoker. 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 denies drinking or recreational drug use  Review of Systems      See HPI  Physical Exam  General:  alert, well-developed, well-nourished, well-hydrated, and overweight-appearing.   Head:  normocephalic and atraumatic.   Eyes:  conjunctiva clear Nose:  no nasal discharge.   Mouth:  good dentition and pharynx pink and moist.   Neck:  no masses.   Chest Wall:  no tenderness.   Lungs:  Normal respiratory effort, chest expands symmetrically. Lungs are clear to auscultation, no crackles; dry hacking cough with forced exp wheeze Heart:  Normal rate and regular rhythm. S1 and S2 normal without gallop, murmur, click, rub or other extra sounds. Extremities:  no LE edema, clubbing or cyanosis Skin:  color normal.     Impression & Recommendations:  Problem # 1:  ASTHMA, WITH ACUTE EXACERBATION (ICD-493.92) Mild exacerbation from unkown trigger w/o URI or allergy  symptoms. Will treat with Prednisone 40 mg/ day x 5 days + ProAir inhaler 3-4 x a day scheduled x 1 wk then move to as needed. Call if breathing/ chest tightness worsen or if not improved after 1 wk. Plan to do spirometry this year to stage her. Her updated medication list for this problem includes:    Proair Hfa 108 (90 Base) Mcg/act Aers (Albuterol sulfate) .Marland Kitchen... 2 puffs q 6 hrs as needed for wheezing    Prednisone 20 Mg Tabs (Prednisone) .Marland Kitchen... 2 tabs by mouth once daily x 5 days  Complete Medication List: 1)  Prozac 40 Mg Caps (Fluoxetine hcl) .Marland Kitchen.. 1 tab by mouth qd 2)  Iron 28 Mg Tabs (Ferrous sulfate) .... Take 1 tablet by mouth once a day 3)  Caltrate 600+d 600-400  Mg-unit Tabs (Calcium carbonate-vitamin d) .... Take two by mouth twice daily 4)  Vitamin D 16109 Unit Caps (Ergocalciferol) .Marland Kitchen.. 1 capsule by mouth 2 x a wk 5)  Wellbutrin Xl 300 Mg Xr24h-tab (Bupropion hcl) .Marland Kitchen.. 1 tab by mouth daily 6)  Cyanocobalamin 1000 Mcg/ml Soln (Cyanocobalamin) .... 1,000 micrograms (1 ml) injection im once a month 7)  Ibuprofen 800 Mg Tabs (Ibuprofen) .Marland Kitchen.. 1 tab by mouth three times a day with food as needed for knee pain 8)  Proair Hfa 108 (90 Base) Mcg/act Aers (Albuterol sulfate) .... 2 puffs q 6 hrs as needed for wheezing 9)  Prednisone 20 Mg Tabs (Prednisone) .... 2 tabs by mouth once daily x 5 days  Patient Instructions: 1)  Treat asthma flare up prednisone 40 mg once a day x 5 days + ProAir inhaler 2 puffs 4 x a day for the next wk then change to as needed. 2)  Call if not improved after 7 days. Prescriptions: PREDNISONE 20 MG TABS (PREDNISONE) 2 tabs by mouth once daily x 5 days  #10 x 0   Entered and Authorized by:   Seymour Bars DO   Signed by:   Seymour Bars DO on 02/05/2010   Method used:   Electronically to        [facility]* (retail)       [address]       [place], [place]  [address]       Ph: [phone]       Fax: [phone]   RxID:   [id number] PROAIR HFA 108 (90 BASE) MCG/ACT AERS (ALBUTEROL SULFATE) 2 puffs q 6 hrs as needed for wheezing  #1 x 1   Entered and Authorized by:   Seymour Bars DO   Signed by:   Seymour Bars DO on 02/05/2010   Method used:   Electronically to        [facility]* (retail)       [address]       [place], [place]  [address]       Ph: [phone]       Fax: [phone]   RxID:   [id number]    Orders Added: 1)  Est. Patient Level III [60630]

## 2010-04-09 LAB — DIFFERENTIAL
Basophils Absolute: 0.1 10*3/uL (ref 0.0–0.1)
Lymphocytes Relative: 32 % (ref 12–46)
Monocytes Absolute: 0.4 10*3/uL (ref 0.1–1.0)
Neutro Abs: 3.2 10*3/uL (ref 1.7–7.7)
Neutrophils Relative %: 58 % (ref 43–77)

## 2010-04-09 LAB — URINALYSIS, ROUTINE W REFLEX MICROSCOPIC
Bilirubin Urine: NEGATIVE
Glucose, UA: NEGATIVE mg/dL
Hgb urine dipstick: NEGATIVE
Nitrite: NEGATIVE

## 2010-04-09 LAB — BASIC METABOLIC PANEL
BUN: 9 mg/dL (ref 6–23)
Calcium: 9.2 mg/dL (ref 8.4–10.5)
GFR calc non Af Amer: >60 mL/min (ref >60)
Potassium: 3.9 mEq/L (ref 3.5–5.1)
Sodium: 145 mEq/L (ref 135–145)

## 2010-04-09 LAB — CBC
HCT: 33 % — ABNORMAL LOW (ref 36.0–46.0)
Hemoglobin: 11.8 g/dL — ABNORMAL LOW (ref 12.0–15.0)
RDW: 13 % (ref 11.5–15.5)
WBC: 5.6 10*3/uL (ref 4.0–10.5)

## 2010-04-13 LAB — COMPREHENSIVE METABOLIC PANEL
ALT: 13 U/L (ref 0–35)
AST: 25 U/L (ref 0–37)
Alkaline Phosphatase: 82 U/L (ref 39–117)
CO2: 26 mEq/L (ref 19–32)
Calcium: 9.4 mg/dL (ref 8.4–10.5)
Chloride: 107 mEq/L (ref 96–112)
GFR calc Af Amer: >60 mL/min (ref >60)
GFR calc non Af Amer: >60 mL/min (ref >60)
Glucose, Bld: 92 mg/dL (ref 70–99)
Potassium: 4 mEq/L (ref 3.5–5.1)
Sodium: 139 mEq/L (ref 135–145)
Total Bilirubin: 0.5 mg/dL (ref 0.3–1.2)

## 2010-04-13 LAB — CBC
HCT: 36.6 % (ref 36.0–46.0)
Hemoglobin: 12.3 g/dL (ref 12.0–15.0)
MCHC: 33.6 g/dL (ref 30.0–36.0)
RBC: 4.16 MIL/uL (ref 3.87–5.11)

## 2010-04-13 LAB — SURGICAL PCR SCREEN
MRSA, PCR: NEGATIVE
Staphylococcus aureus: NEGATIVE

## 2010-04-13 LAB — DIFFERENTIAL
Basophils Relative: 1 % (ref 0–1)
Eosinophils Absolute: 0 10*3/uL (ref 0.0–0.7)
Eosinophils Relative: 1 % (ref 0–5)
Neutrophils Relative %: 63 % (ref 43–77)

## 2010-04-14 ENCOUNTER — Inpatient Hospital Stay (INDEPENDENT_AMBULATORY_CARE_PROVIDER_SITE_OTHER)
Admission: RE | Admit: 2010-04-14 | Discharge: 2010-04-14 | Disposition: A | Discharge status: Home / Self Care | Payer: BC Managed Care – PPO | Source: Ambulatory Visit | Attending: Family Medicine | Admitting: Family Medicine

## 2010-04-14 ENCOUNTER — Encounter: Payer: Self-pay | Admitting: Family Medicine

## 2010-04-14 DIAGNOSIS — N3000 Acute cystitis without hematuria: Secondary | ICD-10-CM

## 2010-04-14 DIAGNOSIS — R109 Unspecified abdominal pain: Secondary | ICD-10-CM

## 2010-04-14 LAB — CONVERTED CEMR LAB
Ketones, urine, test strip: NEGATIVE
Nitrite: NEGATIVE
Protein, U semiquant: NEGATIVE

## 2010-04-15 ENCOUNTER — Inpatient Hospital Stay (INDEPENDENT_AMBULATORY_CARE_PROVIDER_SITE_OTHER)
Admission: RE | Admit: 2010-04-15 | Discharge: 2010-04-15 | Disposition: A | Discharge status: Home / Self Care | Payer: BC Managed Care – PPO | Source: Ambulatory Visit | Attending: Family Medicine | Admitting: Family Medicine

## 2010-04-15 ENCOUNTER — Encounter: Payer: Self-pay | Admitting: Family Medicine

## 2010-04-15 DIAGNOSIS — N3000 Acute cystitis without hematuria: Secondary | ICD-10-CM

## 2010-04-15 DIAGNOSIS — R109 Unspecified abdominal pain: Secondary | ICD-10-CM

## 2010-04-15 LAB — CONVERTED CEMR LAB
Bilirubin Urine: NEGATIVE
Nitrite: NEGATIVE
Protein, U semiquant: NEGATIVE
Urobilinogen, UA: 0.2
pH: 7

## 2010-04-16 ENCOUNTER — Telehealth (INDEPENDENT_AMBULATORY_CARE_PROVIDER_SITE_OTHER): Payer: Self-pay | Admitting: *Deleted

## 2010-04-16 ENCOUNTER — Encounter: Payer: Self-pay | Admitting: Family Medicine

## 2010-04-16 LAB — CONVERTED CEMR LAB
AST: 17 units/L (ref 0–37)
Alkaline Phosphatase: 75 units/L (ref 39–117)
BUN: 7 mg/dL (ref 6–23)
Creatinine, Ser: 0.67 mg/dL (ref 0.40–1.20)
Lipase: 10 units/L (ref 0–75)
Potassium: 4.5 meq/L (ref 3.5–5.3)
Total Bilirubin: 0.4 mg/dL (ref 0.3–1.2)

## 2010-04-16 LAB — COMPREHENSIVE METABOLIC PANEL
Albumin: 4 g/dL (ref 3.5–5.2)
BUN: 9 mg/dL (ref 6–23)
Calcium: 9 mg/dL (ref 8.4–10.5)
Creatinine, Ser: 0.7 mg/dL (ref 0.4–1.2)
GFR calc Af Amer: >60 mL/min (ref >60)
Total Bilirubin: 0.7 mg/dL (ref 0.3–1.2)
Total Protein: 7.5 g/dL (ref 6.0–8.3)

## 2010-04-16 LAB — CBC
HCT: 34.6 % — ABNORMAL LOW (ref 36.0–46.0)
MCHC: 34.3 g/dL (ref 30.0–36.0)
MCV: 87 fL (ref 78.0–100.0)
Platelets: 209 10*3/uL (ref 150–400)
RDW: 12.5 % (ref 11.5–15.5)

## 2010-04-16 LAB — DIFFERENTIAL
Eosinophils Absolute: 0 10*3/uL (ref 0.0–0.7)
Lymphocytes Relative: 15 % (ref 12–46)
Lymphs Abs: 1.1 10*3/uL (ref 0.7–4.0)
Monocytes Relative: 4 % (ref 3–12)
Neutrophils Relative %: 80 % — ABNORMAL HIGH (ref 43–77)

## 2010-04-16 LAB — URINALYSIS, ROUTINE W REFLEX MICROSCOPIC
Glucose, UA: NEGATIVE mg/dL
Hgb urine dipstick: NEGATIVE
Ketones, ur: NEGATIVE mg/dL
Protein, ur: NEGATIVE mg/dL
Urobilinogen, UA: 0.2 mg/dL (ref 0.0–1.0)

## 2010-04-16 LAB — LIPASE, BLOOD: Lipase: 53 U/L (ref 23–300)

## 2010-04-17 ENCOUNTER — Ambulatory Visit (INDEPENDENT_AMBULATORY_CARE_PROVIDER_SITE_OTHER): Payer: BC Managed Care – PPO | Admitting: Family Medicine

## 2010-04-17 ENCOUNTER — Ambulatory Visit
Admission: RE | Admit: 2010-04-17 | Discharge: 2010-04-17 | Disposition: A | Discharge status: Home / Self Care | Payer: BC Managed Care – PPO | Source: Ambulatory Visit | Attending: Family Medicine | Admitting: Family Medicine

## 2010-04-17 VITALS — BP 128/73 | HR 94 | Temp 98.9°F | Ht 67.0 in | Wt 204.0 lb

## 2010-04-17 DIAGNOSIS — R1011 Right upper quadrant pain: Secondary | ICD-10-CM | POA: Insufficient documentation

## 2010-04-17 MED ORDER — IOHEXOL 300 MG/ML  SOLN
125.0000 mL | Freq: Once | INTRAMUSCULAR | Status: AC | PRN
  Administered 2010-04-17: 125 mL via INTRAVENOUS

## 2010-04-17 NOTE — Progress Notes (Signed)
  Subjective:    Patient ID: [name], female    DOB: [birth year], 50 y.o.   MRN: [id number]  HPI 50 yo WF presents for f/u visit.  She was seen in UC on 3-19 and 3-20 for abd pain, initially w/ nausea.  She was diagnosed with a UTI and had enterococcus grow out on culture.  She was started on Cipro.  Her CBC was normal.  The following day, she went back due to continued severe pain and her CMP, amylase and lipase were normal.  ESR was minimally high at 24.  She was added Flagyl but still continues to have mostly RUQ pain with distension.  She is not having much nausea.  Denies vomitting, diarrhea, constipation or blood in the stool.  She still has a lot of gas, distension and early satiety.  She still has her gall bladder and she has had multiple abdominal surgeries including RYGB.    She is no longer having urinary symptoms.  Past Surgical History  Procedure Date  . Appendectomy   . Dilation and curettage of uterus   . Heel spur surgery     R/L  . Neuroma surgery     left  . Lap gaastric bypass   . Ltcs     X 3   . Total abdominal hysterectomy     w/o oophorectomy/ non cancerous  . Hernia repair 2008    R inguinal hernia   Past Medical History  Diagnosis Date  . DDD (degenerative disc disease)     c spine  . History of gastric bypass   . Diverticulitis   . Hemorrhoids     Dr Colin Benton  . Asthma     mild intermittent  Review of Systems  Constitutional: Positive for activity change and appetite change. Negative for fever, chills, fatigue and unexpected weight change.  Respiratory: Negative for cough, chest tightness and shortness of breath.   Cardiovascular: Negative for chest pain and leg swelling.  Gastrointestinal: Positive for abdominal distention.  Genitourinary: Negative for urgency, hematuria, flank pain, vaginal discharge, difficulty urinating and pelvic pain.  Musculoskeletal: Positive for back pain.  Neurological: Negative for dizziness, weakness and headaches.    BP 128/73  Pulse 94  Temp(Src) 98.9 F (37.2 C) (Oral)  Ht 5\' 7"  (1.702 m)  Wt 204 lb (92.534 kg)  BMI 31.95 kg/m2  SpO2 98%    Objective:   Physical Exam  Constitutional: She appears well-developed and well-nourished. Distressed: mildly distressed with moving around the exam room.  HENT:  Head: Normocephalic and atraumatic.  Eyes: No scleral icterus.  Neck: Normal range of motion. Neck supple.  Cardiovascular: Normal rate, regular rhythm, normal heart sounds and intact distal pulses.   No murmur heard. Pulmonary/Chest: Effort normal and breath sounds normal. No respiratory distress. She has no wheezes. She exhibits no tenderness.  Abdominal: Soft. Bowel sounds are normal. She exhibits distension. She exhibits no mass. There is no hepatosplenomegaly. There is tenderness in the right upper quadrant and periumbilical area. There is guarding and positive Murphy's sign. There is no rigidity, no rebound, no CVA tenderness and no tenderness at McBurney's point.  Musculoskeletal: She exhibits no edema.  Lymphadenopathy:    She has no cervical adenopathy.  Skin: Skin is warm and dry. No rash noted. She is not diaphoretic. No pallor.  Psychiatric: She has a normal mood and affect.          Assessment & Plan:

## 2010-04-17 NOTE — Patient Instructions (Signed)
Will get CT abd/ pelvis today with contrast. I will call you w/ results this evening.  Take it easy. Clear liquid diet. Oxycodone for pain as needed.  Return for follow up next week.

## 2010-04-17 NOTE — Assessment & Plan Note (Signed)
RUQ pain with distension, hx of RYGB, at risk for obstruction, gallstones, diverticulitis given her hx.  Will proceed with CT abd/ pelvis today given her degree of pain, distension and failure to see improvements after 48 hr of abx.  She has anti emetics and pain meds at home.  She is to rest, hydrate with clear fluids and call if getting any worse.  Will f/u her CT today.

## 2010-04-20 LAB — COMPREHENSIVE METABOLIC PANEL
Albumin: 4 g/dL (ref 3.5–5.2)
BUN: 11 mg/dL (ref 6–23)
Calcium: 8.9 mg/dL (ref 8.4–10.5)
Creatinine, Ser: 0.7 mg/dL (ref 0.4–1.2)
Total Protein: 7.3 g/dL (ref 6.0–8.3)

## 2010-04-20 LAB — CBC
HCT: 35.6 % — ABNORMAL LOW (ref 36.0–46.0)
MCV: 87.7 fL (ref 78.0–100.0)
Platelets: 217 10*3/uL (ref 150–400)
RDW: 12.5 % (ref 11.5–15.5)

## 2010-04-20 LAB — DIFFERENTIAL
Basophils Absolute: 0 10*3/uL (ref 0.0–0.1)
Lymphocytes Relative: 27 % (ref 12–46)
Monocytes Absolute: 0.4 10*3/uL (ref 0.1–1.0)
Monocytes Relative: 7 % (ref 3–12)
Neutro Abs: 3.8 10*3/uL (ref 1.7–7.7)

## 2010-04-20 LAB — URINALYSIS, ROUTINE W REFLEX MICROSCOPIC
Glucose, UA: NEGATIVE mg/dL
Hgb urine dipstick: NEGATIVE
Specific Gravity, Urine: 1.009 (ref 1.005–1.030)
Urobilinogen, UA: 0.2 mg/dL (ref 0.0–1.0)

## 2010-04-24 NOTE — Progress Notes (Signed)
  Phone Note Outgoing Call Call back at [facility] Phone [phone]   Call placed by: Lajean Saver RN,  April 16, 2010 10:25 AM Call placed to: Patient Action Taken: Phone Call Completed Summary of Call: Callback: Patient reports she is feeling an imporvement today and was able to return to work. Lab results given. Advised to see PC in next two days and if unable, return here per Dr. Rolla Plate note.

## 2010-04-24 NOTE — Assessment & Plan Note (Signed)
Summary: UPPER RIGHT SIDE PAIN ? rm 5   Vital Signs:  Patient Profile:   50 Years Old Female CC:      increased back pain on the RT side  Height:     68 inches (170.18 cm) Weight:      203.25 pounds O2 Sat:      98 % O2 treatment:    Room Air Temp:     98.8 degrees F oral Pulse rate:   82 / minute Resp:     16 per minute BP sitting:   128 / 77  (left arm) Cuff size:   large  Vitals Entered By: Clemens Catholic LPN (April 15, 2010 10:34 AM)                  Updated Prior Medication List: PROZAC 40 MG CAPS (FLUOXETINE HCL) 1 tab by mouth qd IRON 28 MG  TABS (FERROUS SULFATE) Take 1 tablet by mouth once a day CALTRATE 600+D 600-400 MG-UNIT  TABS (CALCIUM CARBONATE-VITAMIN D) take two by mouth twice daily VITAMIN D 16109 UNIT CAPS (ERGOCALCIFEROL) 1 capsule by mouth 2 x a wk WELLBUTRIN XL 300 MG XR24H-TAB (BUPROPION HCL) 1 tab by mouth daily CYANOCOBALAMIN 1000 MCG/ML SOLN (CYANOCOBALAMIN) 1,000 micrograms (1 ml) injection IM once a month CIPROFLOXACIN HCL 500 MG TABS (CIPROFLOXACIN HCL) 1 by mouth q12hr LORTAB 5 5-500 MG TABS (HYDROCODONE-ACETAMINOPHEN) One by mouth q4 to 6hr as needed pain  Current Allergies (reviewed today): SULFAHistory of Present Illness Chief Complaint: increased back pain on the RT side  History of Present Illness:  Subjective:  Patient reports that her urinary symptoms have resolved, but she continues to right side abdominal pain and right mid-back pain.  Last night she had nausea but no vomiting.  No change in stools.  No fever.  No respiratory symptoms.  REVIEW OF SYSTEMS Constitutional Symptoms      Denies fever, chills, night sweats, weight loss, weight gain, and fatigue.  Eyes       Denies change in vision, eye pain, eye discharge, glasses, contact lenses, and eye surgery. Ear/Nose/Throat/Mouth       Denies hearing loss/aids, change in hearing, ear pain, ear discharge, dizziness, frequent runny nose, frequent nose bleeds, sinus problems, sore  throat, hoarseness, and tooth pain or bleeding.  Respiratory       Denies dry cough, productive cough, wheezing, shortness of breath, asthma, bronchitis, and emphysema/COPD.  Cardiovascular       Denies murmurs, chest pain, and tires easily with exhertion.    Gastrointestinal       Denies stomach pain, nausea/vomiting, diarrhea, constipation, blood in bowel movements, and indigestion. Genitourniary       Denies painful urination, kidney stones, and loss of urinary control. Neurological       Denies paralysis, seizures, and fainting/blackouts. Musculoskeletal       Denies muscle pain, joint pain, joint stiffness, decreased range of motion, redness, swelling, muscle weakness, and gout.  Skin       Denies bruising, unusual mles/lumps or sores, and hair/skin or nail changes.  Psych       Denies mood changes, temper/anger issues, anxiety/stress, speech problems, depression, and sleep problems. Other Comments: pt c/o increased pain in her back, on the RT side. she states that she is having more spasms. no fever, no OTC meds.   Past History:  Past Medical History: Reviewed history from 02/05/2010 and no changes required. c spine DDD [id number] Gastric Bypass diverticulitis hemorrhoids (Dr Colin Benton) mild intermittent asthma  Past Surgical History: Reviewed history from 03/31/2008 and no changes required. Appendectomy conization D&C, heel spur R/L L neuroma surgery lap gastric bypass LTCSx 3 TAH w/o oophorectomy/ (non-cancerous) R inguinal hernia 2008  Dr Colin Benton gen surgery having sigmoid colectomy for diverticulitis in march 2010  Family History: Reviewed history from 02/04/2006 and no changes required. father- alive, HTN, DM  mother healthy, sister and brother healthy  Social History: Reviewed history from 03/31/2008 and no changes required. Separated from  [place].  3 teenage kids. Walks regularly.  Nonsmoker. 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 denies  drinking or recreational drug use   Objective:  No acute distress  Eyes:  Pupils are equal, round, and reactive to light and accomdation.  Extraocular movement is intact.  Conjunctivae are not inflamed.  Mouth/pharynx:  moist mucous membranes  Neck:  Supple.  No adenopathy is present.   Lungs:  Clear to auscultation.  Breath sounds are equal.  Heart:  Regular rate and rhythm without murmurs, rubs, or gallops.  Abdomen:   Vague tenderness in the right lower quadrant without masses or hepatosplenomegaly.  Mild rebound tenderness is present.  Bowel sounds are present.  Mild right flank tenderness is present.  Iliopsoas and obdurator tests are mildly positive. Extremities:  No edema.   Skin:  No rash urinalysis (dipstick):  trace leuks CBC:  WBC 4.2; normal diff; Hgb 11.5 with normal indices Sed rate:  elevated 24 Assessment  Assessed ABDOMINAL PAIN as deteriorated - Donna Christen MD Assessed ACUTE CYSTITIS as improved - Donna Christen MD UTI IMPROVED (URINE CULTURE PENDING).  PATIENT IS S/P PARTIAL COLECTOMY.  REVIEW OF PREVIOUS CT ABDOMEN (11/28/08) REVEALED SCATTERED DIVERTICULOSIS OF COLON.  CT PELVIS REVEALED STABLE TUBULAR CYSTIC STRUCTURE IN RIGHT LOWER QUADRANT ALONG THE ILIOPSOAS MUSCLE.  ? ACUTE DIVERICULITIS.  NOTE NEW ONSET ANEMIA  Plan New Medications/Changes: METRONIDAZOLE 500 MG TABS (METRONIDAZOLE) One by mouth q6hr  #28 x 0, 04/15/2010, Donna Christen MD  New Orders: Ketorolac-Toradol 15mg  [J1885] CBC w/Diff [81191-47829] T-CMP with estimated GFR [[address]] T-Amylase [56213-08657] T-Lipase [83690-23215] T-Sed Rate (Automated) [84696-29528] Urinalysis [CPT-81003] Admin of Therapeutic Inj  intramuscular or subcutaneous [96372] Est. Patient Level IV [41324] Planning Comments:   Patient's pain improved significantly after Toradol 60mg  IM.  Will add Flagyl for better coverage of possible diverticulitis.  Amylase and lipase pending. Follow-up with PCP in 48 hours (or  return here) for repeat exam.  Will probably need repeat CT scan abdomen and pelvis. Follow-up with PCP for evaluation of anemia   The patient and/or caregiver has been counseled thoroughly with regard to medications prescribed including dosage, schedule, interactions, rationale for use, and possible side effects and they verbalize understanding.  Diagnoses and expected course of recovery discussed and will return if not improved as expected or if the condition worsens. Patient and/or caregiver verbalized understanding.  Prescriptions: METRONIDAZOLE 500 MG TABS (METRONIDAZOLE) One by mouth q6hr  #28 x 0   Entered and Authorized by:   Donna Christen MD   Signed by:   Donna Christen MD on 04/15/2010   Method used:   Print then Give to Patient   RxID:   [id number]   Medication Administration  Injection # 1:    Medication: Ketorolac-Toradol 15mg     Diagnosis: ABDOMINAL PAIN (ICD-789.00)    Route: IM    Site: RUOQ gluteus    Exp Date: 11/27/2010    Lot #: [id number]    Mfr: Hospira    Comments: 60mg  given  Patient tolerated injection without complications    Given by: Lajean Saver RN (April 15, 2010 11:42 AM)  Orders Added: 1)  Ketorolac-Toradol 15mg  [J1885] 2)  CBC w/Diff [14782-95621] 3)  T-CMP with estimated GFR [[address]] 4)  T-Amylase [82150-23210] 5)  T-Lipase [83690-23215] 6)  T-Sed Rate (Automated) [30865-78469] 7)  Urinalysis [CPT-81003] 8)  Admin of Therapeutic Inj  intramuscular or subcutaneous [96372] 9)  Est. Patient Level IV [62952]     Laboratory Results   Urine Tests  Date/Time Received: April 15, 2010 11:43 AM  Date/Time Reported: April 15, 2010 11:43 AM   Routine Urinalysis   Color: yellow Appearance: Clear Glucose: negative   (Normal Range: Negative) Bilirubin: negative   (Normal Range: Negative) Ketone: negative   (Normal Range: Negative) Spec. Gravity: 1.015   (Normal Range: 1.003-1.035) Blood: negative   (Normal Range: Negative) pH:  7.0   (Normal Range: 5.0-8.0) Protein: negative   (Normal Range: Negative) Urobilinogen: 0.2   (Normal Range: 0-1) Nitrite: negative   (Normal Range: Negative) Leukocyte Esterace: trace   (Normal Range: Negative)

## 2010-04-24 NOTE — Assessment & Plan Note (Signed)
Summary: POSSIBLE UTI? PAIN IN UPPER RIGHT SIDE/NH (rm 2)   Vital Signs:  Patient Profile:   50 Years Old Female CC:      dysuria and frequency x today, right side pain x 3 days Height:     68 inches (170.18 cm) Weight:      204 pounds O2 Sat:      100 % O2 treatment:    Room Air Temp:     98.8 degrees F oral Pulse rate:   84 / minute Resp:     16 per minute BP sitting:   126 / 79  (left arm) Cuff size:   large  Vitals Entered By: Lajean Saver RN (April 14, 2010 2:52 PM)                  Updated Prior Medication List: PROZAC 40 MG CAPS (FLUOXETINE HCL) 1 tab by mouth qd IRON 28 MG  TABS (FERROUS SULFATE) Take 1 tablet by mouth once a day CALTRATE 600+D 600-400 MG-UNIT  TABS (CALCIUM CARBONATE-VITAMIN D) take two by mouth twice daily VITAMIN D 16109 UNIT CAPS (ERGOCALCIFEROL) 1 capsule by mouth 2 x a wk WELLBUTRIN XL 300 MG XR24H-TAB (BUPROPION HCL) 1 tab by mouth daily CYANOCOBALAMIN 1000 MCG/ML SOLN (CYANOCOBALAMIN) 1,000 micrograms (1 ml) injection IM once a month  Current Allergies (reviewed today): SULFAHistory of Present Illness Chief Complaint: dysuria and frequency x today, right side pain x 3 days History of Present Illness:  Subjective:  Patient presents with symptoms of a UTI that started this morning. Complains of dysuria, frequency, and urgency.  No hematuria or nocturia.  No abnormal vaginal discharge.  No fever/chills/sweats.  + right abdominal pain and right flank pain.  No nausea/vomiting.  She notes that she has had diverticulitis in the past and is s/p partial colectomy.  REVIEW OF SYSTEMS Constitutional Symptoms      Denies fever, chills, night sweats, weight loss, weight gain, and fatigue.  Eyes       Denies change in vision, eye pain, eye discharge, glasses, contact lenses, and eye surgery. Ear/Nose/Throat/Mouth       Denies hearing loss/aids, change in hearing, ear pain, ear discharge, dizziness, frequent runny nose, frequent nose bleeds, sinus  problems, sore throat, hoarseness, and tooth pain or bleeding.  Respiratory       Denies dry cough, productive cough, wheezing, shortness of breath, asthma, bronchitis, and emphysema/COPD.  Cardiovascular       Denies murmurs, chest pain, and tires easily with exhertion.    Gastrointestinal       Denies stomach pain, nausea/vomiting, diarrhea, constipation, blood in bowel movements, and indigestion. Genitourniary       Complains of painful urination.      Denies blood or discharge from vagina, kidney stones, and loss of urinary control.      Comments: urinary frequency Neurological       Denies paralysis, seizures, and fainting/blackouts. Musculoskeletal       Denies muscle pain, joint pain, joint stiffness, decreased range of motion, redness, swelling, muscle weakness, and gout.  Skin       Denies bruising, unusual mles/lumps or sores, and hair/skin or nail changes.  Psych       Denies mood changes, temper/anger issues, anxiety/stress, speech problems, depression, and sleep problems. Other Comments: Patient c/o urinary symptoms x today and right side pain x 3 days that has moved from her back   Past History:  Past Medical History: Reviewed history from 02/05/2010 and no changes  required. c spine DDD [id number] Gastric Bypass diverticulitis hemorrhoids (Dr Colin Benton) mild intermittent asthma  Past Surgical History: Reviewed history from 03/31/2008 and no changes required. Appendectomy conization D&C, heel spur R/L L neuroma surgery lap gastric bypass LTCSx 3 TAH w/o oophorectomy/ (non-cancerous) R inguinal hernia 2008  Dr Colin Benton gen surgery having sigmoid colectomy for diverticulitis in march 2010  Family History: Reviewed history from 02/04/2006 and no changes required. father- alive, HTN, DM  mother healthy, sister and brother healthy  Social History: Reviewed history from 03/31/2008 and no changes required. Separated from  [place].  3 teenage kids. Walks regularly.   Nonsmoker. 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 denies drinking or recreational drug use   Objective:  No acute distress  Eyes:  Pupils are equal, round, and reactive to light and accomdation.  Extraocular movement is intact.  Conjunctivae are not inflamed.  Mouth:  moist mucous membranes  Neck:  Supple.  No adenopathy is present.  Lungs:  Clear to auscultation.  Breath sounds are equal.  Heart:  Regular rate and rhythm without murmurs, rubs, or gallops.  Abdomen:  There is mild tenderness to the right of umbilicus without masses or hepatosplenomegaly.  Bowel sounds are present.  There is mild right CVA and flank tenderness.  Skin:  No rash Extremities:  No edema.  urinalysis (dipstick):  1+ blood, 2+ leuks Assessment New Problems: ABDOMINAL PAIN (ICD-789.00) ACUTE CYSTITIS (ICD-595.0)  UTI; ? RECURRENT DIVERTICULITIS  Plan New Medications/Changes: LORTAB 5 5-500 MG TABS (HYDROCODONE-ACETAMINOPHEN) One by mouth q4 to 6hr as needed pain  #10 (ten) x 0, 04/14/2010, Donna Christen MD CIPROFLOXACIN HCL 500 MG TABS (CIPROFLOXACIN HCL) 1 by mouth q12hr  #20 x 0, 04/14/2010, Donna Christen MD  New Orders: Urinalysis [CPT-81003] T-Culture, Urine [[id number]] Est. Patient Level III [25366] Planning Comments:   Urine culture pending.  Begin clear liquids and slowly advance diet.  Rest. Begin Cipro.  Rx for analgesic. Return for worsening symptoms   The patient and/or caregiver has been counseled thoroughly with regard to medications prescribed including dosage, schedule, interactions, rationale for use, and possible side effects and they verbalize understanding.  Diagnoses and expected course of recovery discussed and will return if not improved as expected or if the condition worsens. Patient and/or caregiver verbalized understanding.  Prescriptions: LORTAB 5 5-500 MG TABS (HYDROCODONE-ACETAMINOPHEN) One by mouth q4 to 6hr as needed pain  #10 (ten) x 0   Entered and  Authorized by:   Donna Christen MD   Signed by:   Donna Christen MD on 04/14/2010   Method used:   Print then Give to Patient   RxID:   [id number] CIPROFLOXACIN HCL 500 MG TABS (CIPROFLOXACIN HCL) 1 by mouth q12hr  #20 x 0   Entered and Authorized by:   Donna Christen MD   Signed by:   Donna Christen MD on 04/14/2010   Method used:   Print then Give to Patient   RxID:   [id number]   Orders Added: 1)  Urinalysis [CPT-81003] 2)  T-Culture, Urine [30160-10932] 3)  Est. Patient Level III [35573]    Laboratory Results   Urine Tests  Date/Time Received: April 14, 2010 2:54 PM  Date/Time Reported: April 14, 2010 2:54 PM   Routine Urinalysis   Color: yellow Appearance: Clear Glucose: negative   (Normal Range: Negative) Bilirubin: negative   (Normal Range: Negative) Ketone: negative   (Normal Range: Negative) Spec. Gravity: 1.010   (Normal Range: 1.003-1.035) Blood: small   (Normal  Range: Negative) pH: 6.0   (Normal Range: 5.0-8.0) Protein: negative   (Normal Range: Negative) Urobilinogen: 0.2   (Normal Range: 0-1) Nitrite: negative   (Normal Range: Negative) Leukocyte Esterace: moderate   (Normal Range: Negative)

## 2010-05-08 LAB — BASIC METABOLIC PANEL
BUN: 3 mg/dL — ABNORMAL LOW (ref 6–23)
Chloride: 103 mEq/L (ref 96–112)
Creatinine, Ser: 0.52 mg/dL (ref 0.4–1.2)
GFR calc Af Amer: >60 mL/min (ref >60)
GFR calc non Af Amer: >60 mL/min (ref >60)

## 2010-05-08 LAB — DIFFERENTIAL
Basophils Absolute: 0 10*3/uL (ref 0.0–0.1)
Basophils Relative: 1 % (ref 0–1)
Eosinophils Relative: 2 % (ref 0–5)
Lymphocytes Relative: 31 % (ref 12–46)
Monocytes Absolute: 0.2 10*3/uL (ref 0.1–1.0)
Neutro Abs: 2.1 10*3/uL (ref 1.7–7.7)

## 2010-05-08 LAB — COMPREHENSIVE METABOLIC PANEL
AST: 18 U/L (ref 0–37)
Alkaline Phosphatase: 68 U/L (ref 39–117)
BUN: 7 mg/dL (ref 6–23)
CO2: 26 mEq/L (ref 19–32)
Chloride: 107 mEq/L (ref 96–112)
Creatinine, Ser: 0.67 mg/dL (ref 0.4–1.2)
GFR calc non Af Amer: >60 mL/min (ref >60)
Potassium: 3.9 mEq/L (ref 3.5–5.1)
Total Bilirubin: 0.7 mg/dL (ref 0.3–1.2)

## 2010-05-08 LAB — CBC
HCT: 37.9 % (ref 36.0–46.0)
Hemoglobin: 12.8 g/dL (ref 12.0–15.0)
MCV: 89.6 fL (ref 78.0–100.0)
MCV: 89.8 fL (ref 78.0–100.0)
Platelets: 213 10*3/uL (ref 150–400)
RBC: 3.74 MIL/uL — ABNORMAL LOW (ref 3.87–5.11)
RBC: 4.22 MIL/uL (ref 3.87–5.11)
WBC: 3.5 10*3/uL — ABNORMAL LOW (ref 4.0–10.5)
WBC: 5.7 10*3/uL (ref 4.0–10.5)

## 2010-05-12 LAB — URINALYSIS, ROUTINE W REFLEX MICROSCOPIC
Bilirubin Urine: NEGATIVE
Glucose, UA: NEGATIVE mg/dL
Hgb urine dipstick: NEGATIVE
Protein, ur: NEGATIVE mg/dL

## 2010-05-12 LAB — CBC
HCT: 38.9 % (ref 36.0–46.0)
Hemoglobin: 13.2 g/dL (ref 12.0–15.0)
MCHC: 34.1 g/dL (ref 30.0–36.0)
MCV: 88.5 fL (ref 78.0–100.0)
RBC: 4.39 MIL/uL (ref 3.87–5.11)
WBC: 8.8 10*3/uL (ref 4.0–10.5)

## 2010-05-12 LAB — DIFFERENTIAL
Basophils Relative: 1 % (ref 0–1)
Eosinophils Absolute: 0 10*3/uL (ref 0.0–0.7)
Eosinophils Relative: 0 % (ref 0–5)
Lymphs Abs: 1.1 10*3/uL (ref 0.7–4.0)
Monocytes Absolute: 0.4 10*3/uL (ref 0.1–1.0)
Monocytes Relative: 4 % (ref 3–12)
Neutrophils Relative %: 83 % — ABNORMAL HIGH (ref 43–77)

## 2010-05-12 LAB — BASIC METABOLIC PANEL
CO2: 28 mEq/L (ref 19–32)
Chloride: 103 mEq/L (ref 96–112)
GFR calc Af Amer: >60 mL/min (ref >60)
Potassium: 3.9 mEq/L (ref 3.5–5.1)
Sodium: 139 mEq/L (ref 135–145)

## 2010-05-22 ENCOUNTER — Ambulatory Visit (INDEPENDENT_AMBULATORY_CARE_PROVIDER_SITE_OTHER): Payer: BC Managed Care – PPO | Admitting: Family Medicine

## 2010-05-22 ENCOUNTER — Encounter: Payer: Self-pay | Admitting: Family Medicine

## 2010-05-22 ENCOUNTER — Telehealth: Payer: Self-pay | Admitting: Family Medicine

## 2010-05-22 ENCOUNTER — Ambulatory Visit
Admission: RE | Admit: 2010-05-22 | Discharge: 2010-05-22 | Disposition: A | Discharge status: Home / Self Care | Payer: BC Managed Care – PPO | Source: Ambulatory Visit | Attending: Family Medicine | Admitting: Family Medicine

## 2010-05-22 VITALS — BP 109/72 | HR 78 | Ht 68.0 in | Wt 205.0 lb

## 2010-05-22 DIAGNOSIS — G8929 Other chronic pain: Secondary | ICD-10-CM

## 2010-05-22 DIAGNOSIS — M5030 Other cervical disc degeneration, unspecified cervical region: Secondary | ICD-10-CM

## 2010-05-22 DIAGNOSIS — M12819 Other specific arthropathies, not elsewhere classified, unspecified shoulder: Secondary | ICD-10-CM

## 2010-05-22 DIAGNOSIS — M542 Cervicalgia: Secondary | ICD-10-CM

## 2010-05-22 MED ORDER — AMBULATORY NON FORMULARY MEDICATION: Status: DC

## 2010-05-22 MED ORDER — CYCLOBENZAPRINE HCL 5 MG PO TABS: 5.0000 mg | ORAL_TABLET | Freq: Every evening | ORAL | Status: AC | PRN

## 2010-05-22 MED ORDER — HYDROCODONE-ACETAMINOPHEN 5-500 MG PO TABS: ORAL_TABLET | ORAL | Status: DC

## 2010-05-22 NOTE — Patient Instructions (Signed)
For neck pain:  Xray neck downstairs today. Will call you w/ results and will set you up for PT down the hall.  Use Ketoprofen gel in place of Naproxen up to 3 x a day. Use Flexeril at night as muscle relaxer and Vicodin for severe pain at night.  F/U with Dr Margaretha Sheffield for shoulder pain.

## 2010-05-22 NOTE — Progress Notes (Signed)
  Subjective:    Patient ID: [name], female    DOB: Jun 25, 1960, 51 y.o.   MRN: [id number]  HPI  50 yo W F presents for chronic neck pain and bilateral shoulder pain. She was told that she had AC arthritis on both sides.  She had relief with injections thru Dr Dion Saucier.  She is taking Naproxen, flexeril and vicodin at night.  She is using heat and ice.  She is having to flex her neck more at work and it hurts.  She did not do PT but did see Dr Arville Care for chiropractic care.  She feels like this didn't help.  She had neck xrays done yrs ago and an MRI of her L shoulder.  Denies tingling/ nubmenss/ weakness down the arms or hands.  Has a distant hx of cervical bulging disk from 8 yrs ago but did not do intervention for it.  BP 109/72  Pulse 78  Ht 5\' 8"  (1.727 m)  Wt 205 lb (92.987 kg)  BMI 31.17 kg/m2  SpO2 98%   Review of Systems  As per HPI     Objective:   Physical Exam  Constitutional: She appears well-developed and well-nourished. No distress.  Musculoskeletal: She exhibits no edema.       Right shoulder: She exhibits tenderness (tender with full flexion and ext rotation.  tender over both AC joints). She exhibits normal range of motion and normal strength.       Cervical back: She exhibits decreased range of motion (decreased globally by 20% wwith pain), tenderness (tender over paraspinal muscles and tight trap muscles) and spasm. She exhibits no swelling.          Assessment & Plan:

## 2010-05-22 NOTE — Telephone Encounter (Signed)
Pt aware of the above  

## 2010-05-22 NOTE — Assessment & Plan Note (Signed)
Neck pain - acute on chronic with hx of DDD.  Failed to improve with chiropractic care.  Will xrays today  And set her up for PT.  I filled her ketoprofen gel instead of naproxen and she can use flexeril and vicodin at night as needed.

## 2010-05-22 NOTE — Telephone Encounter (Signed)
Pls let pt know that she does have moderate degenerative disc dz in her c spine.  Continue current plan but if not improving after 4- 6 wks, will plan to get an MRI.

## 2010-05-22 NOTE — Assessment & Plan Note (Signed)
She is due to see Dr Margaretha Sheffield back.  I did fill her NSAID and pain meds. Flexeril today.she agrees to call for appt

## 2010-05-28 ENCOUNTER — Ambulatory Visit: Payer: BC Managed Care – PPO | Attending: Family Medicine | Admitting: Physical Therapy

## 2010-05-28 DIAGNOSIS — IMO0001 Reserved for inherently not codable concepts without codable children: Secondary | ICD-10-CM | POA: Insufficient documentation

## 2010-05-28 DIAGNOSIS — M542 Cervicalgia: Secondary | ICD-10-CM | POA: Insufficient documentation

## 2010-05-28 DIAGNOSIS — M2560 Stiffness of unspecified joint, not elsewhere classified: Secondary | ICD-10-CM | POA: Insufficient documentation

## 2010-06-03 ENCOUNTER — Encounter: Payer: BC Managed Care – PPO | Admitting: Physical Therapy

## 2010-06-06 ENCOUNTER — Ambulatory Visit: Payer: BC Managed Care – PPO | Admitting: Physical Therapy

## 2010-06-10 NOTE — Op Note (Signed)
[name], [name]               ACCOUNT NO.:  [id number]   MEDICAL RECORD NO.:  [id number]          PATIENT TYPE:  INP   LOCATION:  1538                         FACILITY:  [facility]   PHYSICIAN:  Alfonse Ras, MD   DATE OF BIRTH:  March 22, 1960   DATE OF PROCEDURE:  DATE OF DISCHARGE:                               OPERATIVE REPORT   PREOPERATIVE DIAGNOSIS:  History of gastric bypass with recurrent  sigmoid diverticulitis.   POSTOPERATIVE DIAGNOSIS:  History of gastric bypass with recurrent  sigmoid diverticulitis with multicystic left ovary.   PROCEDURE:  Lap-assisted sigmoid colectomy with primary anastomosis and  resection of a small mucocele from the pelvis.   ANESTHESIA:  General.   ASSISTANT:  Ardeth Sportsman, MD.   DESCRIPTION:  The patient was taken to the operating room after  extensive informed consent was granted bowel prep was performed at home.  Using a small infraumbilical incision, I dissected down the fascia and  this was opened vertically.  O Vicryl pursestring suture was placed on  the fascial defect and Hasson trocar was placed in the abdomen.  Pneumoperitoneum was obtained.  A 5-mm trocar was placed in the lower  midline and in the left upper quadrant.  The inflamed and diseased left  distal sigmoid colon was identified.  This was mobilized down to the  rectum along the line of Toldt using the harmonic scalpel.  The left  ureter and left iliac artery were identified and preserved.  With  extensive mobilization including the splenic flexure, this easily  mobilized the colon.  A 6 cm incision was then extended from the  previous infraumbilical site and hand port was placed.  The colon was  grasped and brought out through the hand port without difficulty.  There  was no tension whatsoever.  The area was resected with two 75 mm GIA  stapling devices.  Primary anastomosis was accomplished with the 75 mm  GIA stapling device and the defect was closed with a TA-60.   Mesentery  had been taken down with the LigaSure.  Adequate hemostasis was insured.  The abdomen was copiously irrigated.  Of note, on dissection down near  the rectum, there was identified and multicystic left ovary and a small  mucocele which was resected and sent for pathologic evaluation.  The  fascia was then closed with running #1 Novofil.  Skin was closed with 3-  0 Monocryl sutures.   SPECIMENS:  Sigmoid colon and small mucocele from the pelvis.   ESTIMATED BLOOD LOSS:  Minimal.   The patient tolerated the procedure well and went to PACU in good  condition.     Alfonse Ras, MD  Electronically Signed    KRE/MEDQ  D:  04/06/2008  T:  04/06/2008  Job:  [id number]

## 2010-06-10 NOTE — Discharge Summary (Signed)
[name], [name]               ACCOUNT NO.:  [id number]   MEDICAL RECORD NO.:  [id number]          PATIENT TYPE:  INP   LOCATION:  1538                         FACILITY:  [facility]   PHYSICIAN:  Alfonse Ras, MD   DATE OF BIRTH:  1960-04-01   DATE OF ADMISSION:  04/06/2008  DATE OF DISCHARGE:  04/10/2008                               DISCHARGE SUMMARY   ADMISSION DIAGNOSIS:  History of diverticulitis and diverticulosis,  status post gastric bypass.   DISCHARGE DIAGNOSIS:  History of diverticulitis and diverticulosis,  status post gastric bypass.   CONDITION ON DISCHARGE:  Good and improved.   DISPOSITION:  Discharged to home.   MEDICATIONS:  Include same as preop and Vicodin for pain.   Followup is with me in 3 weeks.   HOSPITAL COURSE:  The patient was admitted after home bowel prep,  underwent laparoscopic-assisted sigmoid colectomy.  Postoperatively, she  did very well and was maintained on clear liquids and Entereg for  approximately 3 days.  By postoperative day #3, she was having bowel  movements, was advanced to a regular diet which she tolerated well.  Vital signs remained stable and she was ready for discharge home.      Alfonse Ras, MD  Electronically Signed     KRE/MEDQ  D:  04/10/2008  T:  04/11/2008  Job:  [id number]

## 2010-06-10 NOTE — H&P (Signed)
[name], [name]               ACCOUNT NO.:  [id number]   MEDICAL RECORD NO.:  [id number]          PATIENT TYPE:  INP   LOCATION:  1538                         FACILITY:  [facility]   PHYSICIAN:  Alfonse Ras, MD   DATE OF BIRTH:  11-09-1960   DATE OF ADMISSION:  04/06/2008  DATE OF DISCHARGE:                              HISTORY & PHYSICAL   HISTORY OF PRESENT ILLNESS:  The patient is a very pleasant 50 year old  white female status post gastric bypass in 2005 with three separate  episodes of diverticulitis who now presents for elective sigmoid  colectomy after home bowel prep.  The last episode was in January 2010,  was treated conservatively with antibiotics.  She has not had any  recurrence.  CT scan showed a recent small segmental resection of  diverticulosis and diverticulitis in the past.  She is currently on no  antibiotics.  From her gastric bypass point of view, she has done  incredibly well and has undergone abdominoplasty because of significant  weight loss.  That was accomplished in 2007.  She also underwent a right  inguinal hernia repair by Dr. Shon Hough in 2008 as well.  She presents  with no complaints other than some dry heaves after her bowel prep.   PAST MEDICAL HISTORY:  Her medical history is significant for asthma and  some sinusitis.   CURRENT MEDICATIONS:  Currently, her medications include multivitamins  including B12 and calcium.  She takes no other prescription medications.  She has been placed on the intestinal restoration protocol.   PAST MEDICAL HISTORY:  Significant as above.   PAST SURGICAL HISTORY:  Gastric bypass in 2005 which was done  laparoscopically, abdominoplasty in 2007, and right inguinal hernia  repair in 2008.   REVIEW OF SYSTEMS:  A 15-point review of systems was accomplished in the  office and is on the chart.   SOCIAL HISTORY:  Negative for tobacco or alcohol use.   FAMILY HISTORY:  Noncontributory.   PHYSICAL EXAMINATION:   GENERAL:  On physical exam, she is an age  appropriate white female in no distress.  VITAL SIGNS:  Her weight is 188 pounds.  BMI is 29.51.  HEENT:  Exam is benign.  Normocephalic and atraumatic.  Pupils are  equal, round, and reactive to light.  NECK:  Supple and soft without thyromegaly or cervical adenopathy.  LUNGS:  Clear to auscultation and percussion x2.  HEART:  Regular rate and rhythm without murmurs, rubs, or gallops.  ABDOMEN:  Soft and completely nontender with a well-healed multiple  laparoscopic scars as well as a low transverse abdominoplasty scar.  There are no abdominal wall hernia defects noted.  Of note, she had a  mesh placed for her abdominoplasty, but did have mesh placed for her  inguinal hernia repair.  EXTREMITY:  Exam shows no clubbing, cyanosis, or edema.  NEUROLOGIC:  CN was grossly intact.  PSYCHIATRIC:  She is oriented to time, place, and person.  She seems  very appropriate but somewhat discouraged from her dry heaves.   IMPRESSION:  History of gastric bypass with multiple episodes  of  diverticulitis.   PLAN:  Laparoscopic and hand-assisted sigmoid colectomy with extensive  informed consent.  The patient understands and we will proceed.      Alfonse Ras, MD  Electronically Signed     KRE/MEDQ  D:  04/06/2008  T:  04/06/2008  Job:  [id number]

## 2010-06-10 NOTE — H&P (Signed)
[name], [name]               ACCOUNT NO.:  [id number]   MEDICAL RECORD NO.:  [id number]          PATIENT TYPE:  INP   LOCATION:  1537                         FACILITY:  [facility]   PHYSICIAN:  Vania Rea, M.D. DATE OF BIRTH:  12/07/1960   DATE OF ADMISSION:  11/08/2007  DATE OF DISCHARGE:                              HISTORY & PHYSICAL   PRIMARY CARE PHYSICIAN:  Dr. Seymour Bars at [facility] in  [place].   CHIEF COMPLAINT:  Abdominal pain.   HISTORY OF PRESENT ILLNESS:  This is a 50 year old Caucasian lady who is  being admitted by the [facility] hospitalists service, because the [facility]  hospitalist group reports that they do not cover [employer]  patients out of [place].  The patient has a know history of  diverticulitis with two episodes, managed as an outpatient, and was in  good health until yesterday afternoon, while at work she started  developing left lower quadrant abdominal pain which became increasingly  more severe, and she eventually went to the free standing ER at [facility].  She has had no nausea, no vomiting, no diarrhea.  At [facility]  she was evaluated, and because the pain was so severe, required  intravenous narcotics.  The hospitalist service was called to assist  with management.  She did have a CT scan done in the emergency room  which revealed sigmoid diverticulitis without abscess and without pelvic  free fluid.   PAST MEDICAL HISTORY:  1. Laparoscopic gastric bypass about four years ago and subsequent      abdominoplasty.  2. Recurrent diverticulitis.  3. Recently diagnosed vitamin D deficiency.   MEDICATIONS:  1. Vitamin D 50,000 units weekly.  2. Vitamin B-12 daily.  3. Calcium daily.  4. Iron tablets daily.  5. Multivitamins daily.   ALLERGIES:  SULFA.   SOCIAL HISTORY:  No history of tobacco, alcohol or illicit drug use.  She works as a Pharmacologist at [employer]   FAMILY HISTORY:  Significant for  hypertension in her father, otherwise,  unremarkable.   REVIEW OF SYSTEMS:  A complete 12-point review of systems other than  noted above was completely negative.   PHYSICAL EXAMINATION:  GENERAL:  A very pleasant, middle-aged Caucasian  lady lying in bed in no acute distress.  VITAL SIGNS:  Temperature 98.5, pulse 75, respirations 20, blood  pressure 150/22, saturating 100% on room air.  HEENT:  Pupils are round and equal.  Mucous membranes pink and  anicteric.  No cervical lymphadenopathy.  No thyromegaly.  No carotid  bruit.  No jugular venous distention.  No oral lesions.  CHEST:  Clear to auscultation bilaterally.  CARDIOVASCULAR:  Regular rhythm without murmur.  ABDOMEN:  Soft, slightly obese.  She is exquisitely tender in the left  flank and left lower quadrant.  She has diminished bowel sounds.  EXTREMITIES:  Without edema, 2+ pulses bilaterally.  Muscle tone and  bulk are normal.  She has no bony strength deformity.  SKIN:  She is somewhat flushed around the face and neck and back,  though, she denies any itching.  CNS:  Cranial nerves II-XII grossly intact.  No focal neurological  deficits.   LABORATORY DATA:  CBC:  Unremarkable.  White count of 9.7 and absolute  granulocyte count of 7.6, hemoglobin normal at 12.9, platelets normal at  206.  Serum chemistry is likewise unremarkable.  Liver function tests  are completely normal. Lipase is 45.  Urinalysis is likewise completely  normal.  CT scan of her abdomen shows 2 mm nonobstructing right lower  pole renal calculus.  Otherwise, unremarkable.  CT scan of the pelvis  shows wall thickening and adjacent inflammation along the distal  descending colon compatible with diverticulitis, no evidence of  pneumoperitoneum or focal abscess.  No bowel obstruction.  No free  fluid.  No enlarged lymph nodes identified.   ASSESSMENT:  Acute diverticulitis.   PLAN:  Will admit this lady for intravenous antibiotics, Cipro, Flagyl,  IV  narcotics, and when she is settled down and able to tolerate an oral  pain medication, she should be able to be discharged home.  The patient  has been advised on the importance of a high fiber diet at discharge.      Vania Rea, M.D.  Electronically Signed     LC/MEDQ  D:  11/08/2007  T:  11/08/2007  Job:  [id number]   cc:   Seymour Bars, D.O.  [facility].  [address]  [place], [place] [address]

## 2010-06-11 ENCOUNTER — Other Ambulatory Visit: Payer: Self-pay | Admitting: *Deleted

## 2010-06-11 MED ORDER — BUPROPION HCL ER (XL) 300 MG PO TB24: 300.0000 mg | ORAL_TABLET | Freq: Every day | ORAL | Status: DC

## 2010-06-13 NOTE — Op Note (Signed)
[name], [name]               ACCOUNT NO.:  [id number]   MEDICAL RECORD NO.:  [id number]          PATIENT TYPE:  AMB   LOCATION:  DSC                          FACILITY:  [facility]   PHYSICIAN:  Gerald L. Truesdale, M.D.DATE OF BIRTH:  [birth year]   DATE OF PROCEDURE:  05/10/2006  DATE OF DISCHARGE:                               OPERATIVE REPORT   HISTORY:  Patient with a bulge involving her right groin area,  consistent with possible hernia versus lipoma or both.  Increased pain  and discomfort.  The patient came to my office last Friday where a  reduction was done of the area.  Over the weekend, it has come back out.   PROCEDURE PLANNED:  Exploration of the right groin area.  Repair of  defect groin hernia with mesh.   ANESTHESIA:  General.   Preoperatively, the patient was stood up and drawn for the area of  defect.  Prep was done to the right groin and abdominal area using  Betadine simple solution and water with sterile towels and drapes so as  to make a sterile field.  Incision was made in the lower groin area  dissected down through the skin.  Scarp was fashioned underlying defect.  The fascia was cleaned around the defect entirely using the Metzenbaum  scissors; they were to explore the area.  There was no evidence of any  major defects.  The area measured approximately 2 x 3.5 cm.  It was  repaired back with a Prolene mesh patch that was sewn in place with  muscle sutures of 0 Surgilon.  Irrigation was done and the subcutaneous  tissues were closed with 3-0 Monocryl times 2 layers and subcuticular  stitched with 3-0 Monocryl.  Using Steri-Strips, dressing was applied to  all the areas.  She withstood the procedure very well and was taken to  recovery in excellent condition.   ESTIMATED BLOOD LOSS:  Less than 75 mL.   COMPLICATIONS:  None.      Yaakov Guthrie. Shon Hough, M.D.  Electronically Signed     GLT/MEDQ  D:  05/10/2006  T:  05/10/2006  Job:  [id number]

## 2010-06-13 NOTE — Op Note (Signed)
NAMEJONETTA, DAGLEY               ACCOUNT NO.:  [id number]   MEDICAL RECORD NO.:  [id number]          PATIENT TYPE:  AMB   LOCATION:  ENDO                         FACILITY:  [facility]   PHYSICIAN:  Sandria Bales. Ezzard Standing, M.D.  DATE OF BIRTH:  [birth year]   DATE OF PROCEDURE:  05/27/2004  DATE OF DISCHARGE:                                 OPERATIVE REPORT   PREPROCEDURE DIAGNOSIS:  Epigastric abdominal pain, status post rule out  gastric bypass.   POSTPROCEDURE DIAGNOSIS:  Normal-appearing jejunum, gastric pouch and  esophagus with normal anastomosis.   PROCEDURES PERFORMED:  Esophagogastroduodenoscopy with biopsy of stomach  wall for CLOtest.   SURGEON:  Sandria Bales. Ezzard Standing, M.D.  (not brother Thayer Ohm)   ANESTHESIA:  Fifty micrograms of fentanyl and 5 mg Versed.   COMPLICATIONS:  None.   INDICATIONS FOR PROCEDURE:  Ms. Waight is a 50 year old white female who in  November 2005 had a laparoscopic Roux-en-Y gastric bypass by Dr. Laray Anger.  Her weight at that time was 276 pounds with a BMI of  approximately 43.  She subsequently lost down to a weight of 203 pounds, but  over the last month she has had a couple bouts with right upper quadrant  abdominal pain of unclear etiology.  She saw Dr. Jaclynn Guarneri last week who  did a CT scan, which was reported as normal.  Dr. Johna Sheriff has arranged for  me to do an upper endoscopy to evaluate Ms. Brickhouse further from a GI  standpoint.   The patient was NPO past midnight.   DESCRIPTION OF PROCEDURE:  The patient was placed in the left lateral  decubitus position after the back of her throat was anesthetized with  Cetacaine and she had a bite block placed.  She was placed on nasal O2, had  a pulse oximeter, an EKG strip running and blood pressure cuff to monitor  her during the procedure.   The flexible Olympus endoscope was passed down the back of her throat, into  her esophagus and into her stomach pouch.  I was actually, though the  anastomosis may be small, able to easily get the scope through.  I think it  is a normal anastomosis.  I was able to cannulate approximately 30-40 cm  down the jejunal limb and saw no ulcer, erosion or any evidence of  obstruction.  I could see staples at the anastomosis, but again I noticed it  was smallish.  I did not think it was particularly strictured.  Her stomach  mucosa appeared bland and had no evidence of any inflammation or irritation.  I did do a biopsy at the site of the gastroesophageal junction.  Again, the  gastrojejunal anastomosis was narrow, but not stenotic.  It was at 45 cm  from the incisors.  The esophagogastric junction was at 40 cm from the  incisors.  I did do a CLO biopsy of the gastric mucosa was H pylori.   I tried to retroflex, but there was not much room in the stomach pouch,  I  then pulled the scope back into the esophagus.  The distal esophagus and the  esophageal lining were all unremarkable.   So my findings really are a very normal-appearing pouch with appropriate  size. A normal anastomosis without any ulcerations.  It is unclear from what  I saw on endoscopy why she should be having abdominal pain.   I talked with the patient and her mother, and they will be seeing Dr.  Luan Pulling later this week for follow up of the CLOtest and further  diagnostics.      DHN/MEDQ  D:  05/27/2004  T:  05/27/2004  Job:  [id number]   cc:   Vikki Ports, MD  [address], [address]  [place]  [place] [address]   Demaris Callander, M.D.  [place], [place]

## 2010-06-13 NOTE — Op Note (Signed)
NAMEDENETRA, FORMOSO               ACCOUNT NO.:  [id number]   MEDICAL RECORD NO.:  [id number]          PATIENT TYPE:  INP   LOCATION:  0450                         FACILITY:  [facility]   PHYSICIAN:  Sharlet Salina T. Hoxworth, M.D.DATE OF BIRTH:  28-Feb-1960   DATE OF PROCEDURE:  12/10/2003  DATE OF DISCHARGE:                                 OPERATIVE REPORT   PROCEDURE:  Upper gastrointestinal endoscopy.   ENDOSCOPIST:  Lorne Skeens. Hoxworth, M.D.   DESCRIPTION OF PROCEDURE:  Upper GI endoscopy is performed intraoperatively  at the completion of laparoscopic Roux-en-Y gastric bypass by Dr.  Luan Pulling. The Olympus video endoscope was inserted into the upper  esophagus and then passed under direct vision to the gastric pouch.  EG  junction was at 42 cm.  The small pouch was entered and was distended with  air.  In order to check for leaks intraoperatively under saline and none  were detected.  The anastomosis was visualized and was patent.  There was no  bleeding from the suture or staple lines.  The pouch measured 5 cm in  length.   Following this air was suctioned and the scope withdrawn.  The patient  tolerated the procedure well.      BTH/MEDQ  D:  12/10/2003  T:  12/10/2003  Job:  [id number]

## 2010-06-13 NOTE — Op Note (Signed)
[name], [name]               ACCOUNT NO.:  [id number]   MEDICAL RECORD NO.:  [id number]          PATIENT TYPE:  INP   LOCATION:  0001                         [facility]   PHYSICIAN:  Vikki Ports, MDDATE OF BIRTH:  19-Mar-1960   DATE OF PROCEDURE:  12/10/2003  DATE OF DISCHARGE:                                 OPERATIVE REPORT   PREOPERATIVE DIAGNOSES:  Morbid obesity.   POSTOPERATIVE DIAGNOSES:  Morbid obesity.   OPERATION PERFORMED:  Laparoscopic Roux-en-Y gastric bypass, antecolic,  antegastric.   SURGEON:  Vikki Ports, M.D.   ASSISTANT:  Sharlet Salina T. Hoxworth, M.D.   ANESTHESIA:  General.   DESCRIPTION OF PROCEDURE:  The patient was taken to the operating room and  placed in supine position.  After adequate  anesthesia was induced using  endotracheal tube, the abdomen was prepped and draped in the normal sterile  fashion.  Using a 12 mm OptiView trocar, pneumoperitoneum was obtained  through entry in the left upper quadrant.  Three additional 12 mm ports were  placed into the abdomen, a 5 mm port was placed in the left abdomen.  The  ligament of Treitz was identified and the small bowel was divided using a  white load 60 mm GIA stapling device 40 cm distal to the ligament of Treitz.  A side-to-side jejunojejunostomy was fashioned by approximating an area 100  cm distal to the division next to the biliary pancreatic limb.  The Roux  limb lay on the patient's right.  The jejunojejunostomy was performed after  enterotomies were made with a Harmonic scalpel.  This was accomplished with  a white load 60 mm GIA.  The defect was closed with running 2-0 Vicryl  suture and Tisseel was placed.  The mesenteric defect was closed with a  running silk suture.   I then turned my attention to the upper abdomen and a Nathanson retractor  was placed through a 5 mm incision in the upper abdomen and a left lateral  segment of the liver was retracted and  reflected anteriorly.  The angle of  His was dissected using blunt and sharp dissection.  The area in the lesser  curve approximately 4 cm from the esophagogastric junction was identified  and the lesser sac was entered.  Using a blue load 50 mm GIA with three  separate firings, the pouch was created with the Ewald tube through the  esophagogastric junction.  Adequate hemostasis was ensured and a gastric  remnant stapled edge was oversewn with a running locking 2-0 silk suture.  A  side-to-side gastrojejunostomy was then fashioned with the tip of the Roux  limb pointing to the patient's left.  This was accomplished with posterior  serosal layer of 2-0 Vicryl.  Enterotomies were then made and side-to-side  anastomosis was made.  The defect was closed with running 2-0 Vicryl sutures  and an anterior serosal area was then placed with 2-0 Vicryl.  The Roux limb  was clamped and Dr. Johna Sheriff performed upper endoscopy showing patency of  the anastomosis and no evidence of leak.  Drain was placed near  the  jejunojejunostomy and Tisseel was  placed over the anastomosis.  Nathanson retractor was removed.  Trocars were  removed.  Pneumoperitoneum had been released and skin was closed with  staples.  The patient tolerated the procedure well and went to PACU in good  condition.     Gaylyn Rong   KRH/MEDQ  D:  12/10/2003  T:  12/10/2003  Job:  [id number]

## 2010-06-13 NOTE — Discharge Summary (Signed)
[name], [name]               ACCOUNT NO.:  [id number]   MEDICAL RECORD NO.:  [id number]          PATIENT TYPE:  INP   LOCATION:  1537                         FACILITY:  [facility]   PHYSICIAN:  Beckey Rutter, MD  DATE OF BIRTH:  12-29-1960   DATE OF ADMISSION:  11/08/2007  DATE OF DISCHARGE:  11/10/2007                               DISCHARGE SUMMARY   PRIMARY CARE PHYSICIAN:  Seymour Bars, D.O., at [facility] in  [place].   CHIEF COMPLAINT AND BRIEF HISTORY OF PRESENT ILLNESS:  A 50 year old  female with history of diverticulitis admitted for left lower quadrant  abdominal pain.  The patient was found to have evidence of  diverticulitis on the sigmoid colon on the CT scan done on the emergency  department.   HOSPITAL COURSE:  During hospital stay the patient was kept n.p.o. and  she received intravenous antibiotic.  The nausea and vomiting subsided  and the patient able tolerate regular food on the day of discharge.   DISCHARGE DIAGNOSES:  1. Sigmoid diverticulitis.  2. Vitamin D deficiency.   DISCHARGE DIAGNOSES:  1. Cipro 500 mg p.o. b.i.d. for 7 days.  2. Flagyl 500 mg p.o. 3 times a day for 7 days.  3. Cyanocobalamin daily.  4. Ferrous sulfate daily.  5. Calcium 2 tablets daily  6. Chewable multivitamins daily.  7. Vitamin D 5000 units weekly.   HOSPITAL PROCEDURE:  CT abdomen and pelvis with contrast on November 07, 2007.  Impression is distal descending colon wall thickening and  inflammation, most compatible with diverticulitis.  No evidence of  pneumoperitoneum or focal abscess.  On the abdominal CT scan impression  is no evidence of acute abnormalities.   DISCHARGE/PLAN:  The patient was discharged to follow up with her  primary physician within 1 week.  She is aware and agreeable to  discharge plan.      Beckey Rutter, MD  Electronically Signed     EME/MEDQ  D:  12/16/2007  T:  12/16/2007  Job:  [id number]   cc:   Seymour Bars,  D.O.  [facility].  [address]  [place], [place] [address]

## 2010-06-20 ENCOUNTER — Encounter: Payer: BC Managed Care – PPO | Admitting: Physical Therapy

## 2010-06-26 ENCOUNTER — Other Ambulatory Visit: Payer: Self-pay

## 2010-06-26 ENCOUNTER — Encounter: Payer: Self-pay | Admitting: Family Medicine

## 2010-06-26 ENCOUNTER — Ambulatory Visit (INDEPENDENT_AMBULATORY_CARE_PROVIDER_SITE_OTHER): Payer: BC Managed Care – PPO | Admitting: Family Medicine

## 2010-06-26 ENCOUNTER — Ambulatory Visit: Payer: BC Managed Care – PPO | Admitting: Physical Therapy

## 2010-06-26 DIAGNOSIS — E049 Nontoxic goiter, unspecified: Secondary | ICD-10-CM

## 2010-06-26 DIAGNOSIS — E010 Iodine-deficiency related diffuse (endemic) goiter: Secondary | ICD-10-CM

## 2010-06-26 DIAGNOSIS — F339 Major depressive disorder, recurrent, unspecified: Secondary | ICD-10-CM

## 2010-06-26 DIAGNOSIS — G43909 Migraine, unspecified, not intractable, without status migrainosus: Secondary | ICD-10-CM | POA: Insufficient documentation

## 2010-06-26 DIAGNOSIS — R1310 Dysphagia, unspecified: Secondary | ICD-10-CM

## 2010-06-26 DIAGNOSIS — E538 Deficiency of other specified B group vitamins: Secondary | ICD-10-CM

## 2010-06-26 DIAGNOSIS — R5381 Other malaise: Secondary | ICD-10-CM

## 2010-06-26 DIAGNOSIS — R5383 Other fatigue: Secondary | ICD-10-CM | POA: Insufficient documentation

## 2010-06-26 MED ORDER — DULOXETINE HCL 60 MG PO CPEP: 60.0000 mg | ORAL_CAPSULE | Freq: Every day | ORAL | Status: DC

## 2010-06-26 MED ORDER — CYANOCOBALAMIN 1000 MCG/ML IJ SOLN
1000.0000 ug | Freq: Once | INTRAMUSCULAR | Status: AC
  Administered 2010-06-26: 1000 ug via INTRAMUSCULAR

## 2010-06-26 NOTE — Assessment & Plan Note (Signed)
Thyroid does feel enlarged and she has dysphagia and thyroid tenderness on exam.  She saw [facility] after having nodules on u/s a year ago but they did not biopsy her.  Will repeat her u/s now that she has more symptoms.

## 2010-06-26 NOTE — Progress Notes (Signed)
  Subjective:    Patient ID: [name], female    DOB: April 16, 1960, 50 y.o.   MRN: [id number]  HPI 50 yo WF presents for fatigue.  She is on MVI, B12 shots, Calcium with D, OTC iron after having RYGB.  She  Is sleepy all the time and feels a fullness in her throat and that breads and meats are sticking in her throat.  She is getting > 8 hrs of sleep.  Not getting any exercise.  She is in PT for her neck pain and getting frequent HAs from her Neck pain.  Hx of migraines.  BP 139/86  Pulse 67  Temp(Src) 98.6 F (37 C) (Oral)  Resp 20  Ht 5\' 7"  (1.702 m)  Wt 202 lb (91.627 kg)  BMI 31.64 kg/m2  SpO2 100%    Review of Systems  Constitutional: Positive for fatigue. Negative for fever, appetite change and unexpected weight change.  HENT: Negative for neck pain.   Respiratory: Negative for shortness of breath.   Cardiovascular: Negative for chest pain, palpitations and leg swelling.  Neurological: Positive for headaches. Negative for dizziness.  Psychiatric/Behavioral: Positive for dysphoric mood. The patient is not nervous/anxious.        Objective:   Physical Exam  Constitutional: She appears well-developed and well-nourished. No distress.       overwt  Eyes: Conjunctivae are normal. No scleral icterus.  Neck: Neck supple. Thyromegaly (thyromegaly with tenderness, able to swallow OK) present.  Cardiovascular: Normal rate, regular rhythm and normal heart sounds.   No murmur heard. Pulmonary/Chest: Effort normal and breath sounds normal.  Musculoskeletal: She exhibits no edema.       Globally limited C spine ROM  Neurological:       No tremor  Skin: Skin is warm and dry.  Psychiatric:       Flat affect          Assessment & Plan:

## 2010-06-26 NOTE — Assessment & Plan Note (Signed)
Daily mild to moderate HAs with neck pain.  Will start her on Bystolic 5 mg daily for both BP reduction and migraine prevention.  She is on muscle relaxers and in PT for her neck pain already.

## 2010-06-26 NOTE — Assessment & Plan Note (Signed)
She still has flat affect and no energy, on wellubtin and prozac.  Will change prozac to cymbalta, 30 mg/ day for the first wk and then up to 60 mg/ day.  Call if any problems. r eturn in 1 month for f/u.

## 2010-06-26 NOTE — Assessment & Plan Note (Signed)
This could be multifactoral -- will check her iron, B12 and thyroid function. She has been more compliant recently with her bariatric supplementation.  She also has chronic daily HAs and depression.  I will  Be adjusting her depression meds and will f/u labs next wk.

## 2010-06-26 NOTE — Patient Instructions (Signed)
Labs today. Will call you w/ results on Monday.  Will schedule thyroid ultrasound downstairs.  Stop Fluoxetine.  Change to Cymbalta 30 mg once daily x 7 days then go up to 60 mg once daily for mood.  Start Bystolic 5 mg once daily for BP reduction and migraine prevention.  Return for f/u in 3 wks.

## 2010-06-27 ENCOUNTER — Encounter: Payer: BC Managed Care – PPO | Admitting: Physical Therapy

## 2010-06-27 LAB — CBC WITH DIFFERENTIAL/PLATELET
Basophils Absolute: 0 10*3/uL (ref 0.0–0.1)
Eosinophils Absolute: 0.1 10*3/uL (ref 0.0–0.7)
Eosinophils Relative: 1 % (ref 0–5)
HCT: 37.8 % (ref 36.0–46.0)
Lymphs Abs: 1.3 10*3/uL (ref 0.7–4.0)
MCH: 27.5 pg (ref 26.0–34.0)
MCV: 88.1 fL (ref 78.0–100.0)
Monocytes Absolute: 0.3 10*3/uL (ref 0.1–1.0)
Platelets: 302 10*3/uL (ref 150–400)
RDW: 15 % (ref 11.5–15.5)

## 2010-06-27 LAB — VITAMIN B12: Vitamin B-12: >2000 pg/mL — ABNORMAL HIGH (ref 211–911)

## 2010-06-27 LAB — IRON AND TIBC
%SAT: 12 % — ABNORMAL LOW (ref 20–55)
Iron: 58 ug/dL (ref 42–145)
TIBC: 471 ug/dL — ABNORMAL HIGH (ref 250–470)

## 2010-06-30 ENCOUNTER — Telehealth: Payer: Self-pay | Admitting: Family Medicine

## 2010-06-30 NOTE — Telephone Encounter (Signed)
LMOM informing Pt of the above and asked Pt to CB w/ last colonoscopy date.

## 2010-06-30 NOTE — Telephone Encounter (Signed)
Pls let pt know that she does have a mild iron deficiency anemia.  Thyroid and parathyroid function look perfect.  B12 above normal.  Stop B12 shots and take OTC Sublingual B12 daily.  Increase OTC iron to 1 tab twice daily.  This may help her energy level.  Ask her when her last colonoscopy was.  Thanks.

## 2010-07-01 ENCOUNTER — Ambulatory Visit: Payer: BC Managed Care – PPO | Attending: Family Medicine | Admitting: Physical Therapy

## 2010-07-01 DIAGNOSIS — M2560 Stiffness of unspecified joint, not elsewhere classified: Secondary | ICD-10-CM | POA: Insufficient documentation

## 2010-07-01 DIAGNOSIS — M542 Cervicalgia: Secondary | ICD-10-CM | POA: Insufficient documentation

## 2010-07-01 DIAGNOSIS — IMO0001 Reserved for inherently not codable concepts without codable children: Secondary | ICD-10-CM | POA: Insufficient documentation

## 2010-07-02 NOTE — Telephone Encounter (Signed)
Pt aware of the above. She will check into pricing and call us back

## 2010-07-02 NOTE — Telephone Encounter (Signed)
I suggest having her salivary testing for compounded hormones.  I am not sure they do this at her compounding pharmacy but they do it at [facility] in [place].

## 2010-07-02 NOTE — Telephone Encounter (Signed)
Pt states her last colonoscopy was 4 yrs ago at [facility]. She would also like to know if you think hormone therapy would be appropriate for the Sxs she is having. Please advise.

## 2010-07-03 ENCOUNTER — Ambulatory Visit: Payer: BC Managed Care – PPO | Admitting: Physical Therapy

## 2010-07-08 ENCOUNTER — Ambulatory Visit: Payer: BC Managed Care – PPO | Admitting: Physical Therapy

## 2010-07-10 ENCOUNTER — Other Ambulatory Visit: Payer: BC Managed Care – PPO

## 2010-07-11 ENCOUNTER — Telehealth: Payer: Self-pay | Admitting: *Deleted

## 2010-07-11 ENCOUNTER — Encounter: Payer: BC Managed Care – PPO | Admitting: Physical Therapy

## 2010-07-11 NOTE — Telephone Encounter (Signed)
Compounded med added

## 2010-07-11 NOTE — Telephone Encounter (Signed)
Pt does need written Rx for compounded med.

## 2010-07-14 ENCOUNTER — Telehealth: Payer: Self-pay | Admitting: Family Medicine

## 2010-07-14 ENCOUNTER — Ambulatory Visit
Admission: RE | Admit: 2010-07-14 | Discharge: 2010-07-14 | Disposition: A | Discharge status: Home / Self Care | Payer: BC Managed Care – PPO | Source: Ambulatory Visit | Attending: Family Medicine | Admitting: Family Medicine

## 2010-07-14 DIAGNOSIS — E041 Nontoxic single thyroid nodule: Secondary | ICD-10-CM

## 2010-07-14 NOTE — Telephone Encounter (Signed)
Pls let pt know that she did have a small nodule on her thyroid but given her tenderness, it is questionable if she has thyroiditis (inflammation of the thyroid gland).  It would be better to get her in with endocrinology for this as opposed to going back to the surgeon.  I'd like her to see Dr Morrison Old here in [place].  Will set her up for appt.  pls fax him a copy of her u/s results.  Thanks.

## 2010-07-14 NOTE — Telephone Encounter (Signed)
Pt aware of the above  

## 2010-07-18 ENCOUNTER — Telehealth: Payer: Self-pay | Admitting: *Deleted

## 2010-07-18 NOTE — Telephone Encounter (Signed)
Pt needs Rx for compounded hormone replacement. Please print and I will fax to pharm

## 2010-07-20 NOTE — Telephone Encounter (Signed)
Did she go for the testing yet?

## 2010-07-21 MED ORDER — AMBULATORY NON FORMULARY MEDICATION: Status: DC

## 2010-07-21 NOTE — Telephone Encounter (Signed)
RX printed.  Will sign and fax today.

## 2010-07-21 NOTE — Telephone Encounter (Signed)
Yes, you have already reviewed them and is in her med list under non-form.

## 2010-07-24 ENCOUNTER — Ambulatory Visit (INDEPENDENT_AMBULATORY_CARE_PROVIDER_SITE_OTHER): Payer: BC Managed Care – PPO | Admitting: Family Medicine

## 2010-07-24 ENCOUNTER — Encounter: Payer: Self-pay | Admitting: Family Medicine

## 2010-07-24 DIAGNOSIS — R0609 Other forms of dyspnea: Secondary | ICD-10-CM

## 2010-07-24 DIAGNOSIS — R0683 Snoring: Secondary | ICD-10-CM

## 2010-07-24 DIAGNOSIS — E010 Iodine-deficiency related diffuse (endemic) goiter: Secondary | ICD-10-CM

## 2010-07-24 DIAGNOSIS — R0989 Other specified symptoms and signs involving the circulatory and respiratory systems: Secondary | ICD-10-CM

## 2010-07-24 DIAGNOSIS — E049 Nontoxic goiter, unspecified: Secondary | ICD-10-CM

## 2010-07-24 DIAGNOSIS — F339 Major depressive disorder, recurrent, unspecified: Secondary | ICD-10-CM

## 2010-07-24 DIAGNOSIS — R635 Abnormal weight gain: Secondary | ICD-10-CM

## 2010-07-24 DIAGNOSIS — Z9884 Bariatric surgery status: Secondary | ICD-10-CM

## 2010-07-24 NOTE — Patient Instructions (Signed)
Stay on current meds.  F/U with Dr Morrison Old for ? Thyroiditis.  Stay on hormones.  Set up sleep study.  Call if any problems.  Return for f/u in 2 mos.

## 2010-07-24 NOTE — Progress Notes (Signed)
  Subjective:    Patient ID: [name], female    DOB: 06/17/1960, 50 y.o.   MRN: [id number]  HPI 50 yo WF presents for f/u visit.  She is still tired.  Changing her Fluoxetine to Cymbalta has helped mood but she still feels very tired.  She is taking her vitamins and supplements.  She just started on compounded hormones this wk so too early to tell.  We started her on bystolic for both elevated BPs and migraine prevention.  She is done with PT and is doing home traction now.  She is no longer having HAs daily.  She had a low normal iron deficiency anemia on labs but o/w was nomral.  She continues to gain weight.  She is not regularly exercising.  She denies any change to her diet.  She snores at night and always feels tired.  BP 128/84  Pulse 75  Ht 5\' 7"  (1.702 m)  Wt 207 lb (93.895 kg)  BMI 32.42 kg/m2   Review of Systems  Constitutional: Positive for fatigue.  Respiratory: Negative for shortness of breath.   Cardiovascular: Negative for chest pain, palpitations and leg swelling.  Neurological: Positive for headaches.       Objective:   Physical Exam  Constitutional: She appears well-developed and well-nourished. No distress.       obese  HENT:  Mouth/Throat: Oropharynx is clear and moist.  Eyes: Conjunctivae are normal.  Neck: Thyromegaly (slightly tender) present.  Cardiovascular: Normal rate, regular rhythm and normal heart sounds.   Musculoskeletal: She exhibits no edema.  Skin: Skin is warm and dry.  Psychiatric: She has a normal mood and affect.          Assessment & Plan:

## 2010-07-24 NOTE — Assessment & Plan Note (Signed)
Mood slightly improved with change from fluoxetine to cymbalta and is doing well on it but still tired.  Will r/o other causes of chronic fatigue and continue the cymbalta.

## 2010-07-24 NOTE — Assessment & Plan Note (Signed)
Reviewed her recent labs which included bariatric labs.  She is still a bit iron deficient and I reminded her to take her supplements daily.  I am very concerned about her weight regain this far out from surgery.  Stressed the importance of exercise, which she is not doing.  Will get a sleep study given her snoring, fatigue, HAs, wt gain, higher BPs.

## 2010-07-24 NOTE — Assessment & Plan Note (Signed)
Reviewed results of her thyroid biopsy and she is set up to see Dr Morrison Old next wk.

## 2010-07-28 ENCOUNTER — Encounter: Payer: Self-pay | Admitting: Family Medicine

## 2010-07-28 ENCOUNTER — Inpatient Hospital Stay (INDEPENDENT_AMBULATORY_CARE_PROVIDER_SITE_OTHER)
Admission: RE | Admit: 2010-07-28 | Discharge: 2010-07-28 | Disposition: A | Discharge status: Home / Self Care | Payer: BC Managed Care – PPO | Source: Ambulatory Visit | Attending: Family Medicine | Admitting: Family Medicine

## 2010-07-28 DIAGNOSIS — N3000 Acute cystitis without hematuria: Secondary | ICD-10-CM

## 2010-07-28 DIAGNOSIS — R300 Dysuria: Secondary | ICD-10-CM

## 2010-07-28 LAB — CONVERTED CEMR LAB
Blood in Urine, dipstick: 3
Glucose, Urine, Semiquant: NEGATIVE
Ketones, urine, test strip: NEGATIVE
Nitrite: NEGATIVE
Urobilinogen, UA: 0.2

## 2010-07-30 ENCOUNTER — Telehealth (INDEPENDENT_AMBULATORY_CARE_PROVIDER_SITE_OTHER): Payer: Self-pay | Admitting: *Deleted

## 2010-08-01 ENCOUNTER — Encounter: Payer: Self-pay | Admitting: Family Medicine

## 2010-08-31 ENCOUNTER — Inpatient Hospital Stay (INDEPENDENT_AMBULATORY_CARE_PROVIDER_SITE_OTHER)
Admission: RE | Admit: 2010-08-31 | Discharge: 2010-08-31 | Disposition: A | Discharge status: Home / Self Care | Payer: Self-pay | Source: Ambulatory Visit | Attending: Family Medicine | Admitting: Family Medicine

## 2010-08-31 ENCOUNTER — Encounter: Payer: Self-pay | Admitting: Family Medicine

## 2010-08-31 DIAGNOSIS — H6980 Other specified disorders of Eustachian tube, unspecified ear: Secondary | ICD-10-CM

## 2010-08-31 DIAGNOSIS — H9319 Tinnitus, unspecified ear: Secondary | ICD-10-CM

## 2010-09-10 ENCOUNTER — Encounter (INDEPENDENT_AMBULATORY_CARE_PROVIDER_SITE_OTHER): Payer: Self-pay | Admitting: *Deleted

## 2010-09-10 ENCOUNTER — Encounter: Payer: Self-pay | Admitting: Emergency Medicine

## 2010-09-10 ENCOUNTER — Telehealth: Payer: Self-pay | Admitting: *Deleted

## 2010-09-10 ENCOUNTER — Inpatient Hospital Stay (INDEPENDENT_AMBULATORY_CARE_PROVIDER_SITE_OTHER)
Admission: RE | Admit: 2010-09-10 | Discharge: 2010-09-10 | Disposition: A | Discharge status: Home / Self Care | Payer: PRIVATE HEALTH INSURANCE | Source: Ambulatory Visit | Attending: Emergency Medicine | Admitting: Emergency Medicine

## 2010-09-10 DIAGNOSIS — H9319 Tinnitus, unspecified ear: Secondary | ICD-10-CM

## 2010-09-10 DIAGNOSIS — J3489 Other specified disorders of nose and nasal sinuses: Secondary | ICD-10-CM

## 2010-09-10 NOTE — Telephone Encounter (Signed)
Pt was seen by Dr. Thurmond Butts and employee health at [facility] for sinus and allergy problems. She is still not feeling well and it was recommended that she see ENT. Can we refer her to an ENT through [facility]?  Please advise.

## 2010-09-10 NOTE — Telephone Encounter (Signed)
This is OK. Will put in referrral.

## 2010-09-26 ENCOUNTER — Ambulatory Visit: Payer: BC Managed Care – PPO | Admitting: Family Medicine

## 2010-09-26 DIAGNOSIS — Z029 Encounter for administrative examinations, unspecified: Secondary | ICD-10-CM

## 2010-10-27 LAB — BASIC METABOLIC PANEL
BUN: 2 — ABNORMAL LOW
CO2: 25
CO2: 25
Calcium: 8.5
Chloride: 110
Creatinine, Ser: 0.58
Creatinine, Ser: 0.61
GFR calc Af Amer: >60
Glucose, Bld: 102 — ABNORMAL HIGH

## 2010-10-27 LAB — CBC
MCHC: 34.1
MCHC: 34.1
MCV: 90.1
Platelets: 159
RBC: 3.64 — ABNORMAL LOW
RBC: 3.75 — ABNORMAL LOW
RDW: 13.4

## 2010-10-28 ENCOUNTER — Inpatient Hospital Stay (INDEPENDENT_AMBULATORY_CARE_PROVIDER_SITE_OTHER)
Admission: RE | Admit: 2010-10-28 | Discharge: 2010-10-28 | Disposition: A | Discharge status: Home / Self Care | Payer: PRIVATE HEALTH INSURANCE | Source: Ambulatory Visit | Attending: Emergency Medicine | Admitting: Emergency Medicine

## 2010-10-28 ENCOUNTER — Encounter: Payer: Self-pay | Admitting: Emergency Medicine

## 2010-10-28 DIAGNOSIS — L290 Pruritus ani: Secondary | ICD-10-CM

## 2010-10-28 DIAGNOSIS — B373 Candidiasis of vulva and vagina: Secondary | ICD-10-CM | POA: Insufficient documentation

## 2010-10-28 LAB — COMPREHENSIVE METABOLIC PANEL
ALT: <6
AST: 22
Albumin: 4.4
CO2: 27
Chloride: 104
GFR calc Af Amer: >60
GFR calc non Af Amer: >60
Sodium: 140
Total Bilirubin: 0.4

## 2010-10-28 LAB — CONVERTED CEMR LAB
Bilirubin Urine: NEGATIVE
Glucose, Urine, Semiquant: NEGATIVE
Ketones, urine, test strip: NEGATIVE
Protein, U semiquant: NEGATIVE
Specific Gravity, Urine: <1.005
Urobilinogen, UA: 0.2
WBC Urine, dipstick: NEGATIVE
pH: 5.5

## 2010-10-28 LAB — URINALYSIS, ROUTINE W REFLEX MICROSCOPIC
Glucose, UA: NEGATIVE
Nitrite: NEGATIVE
Protein, ur: NEGATIVE
Urobilinogen, UA: 1

## 2010-10-28 LAB — DIFFERENTIAL
Lymphocytes Relative: 15
Lymphs Abs: 1.4
Neutro Abs: 7.6
Neutrophils Relative %: 78 — ABNORMAL HIGH

## 2010-10-28 LAB — APTT: aPTT: 29

## 2010-10-28 LAB — CBC
Platelets: 216
WBC: 9.7

## 2010-11-19 DIAGNOSIS — M19019 Primary osteoarthritis, unspecified shoulder: Secondary | ICD-10-CM | POA: Insufficient documentation

## 2010-11-19 DIAGNOSIS — R21 Rash and other nonspecific skin eruption: Secondary | ICD-10-CM | POA: Insufficient documentation

## 2010-11-20 DIAGNOSIS — L253 Unspecified contact dermatitis due to other chemical products: Secondary | ICD-10-CM | POA: Insufficient documentation

## 2010-12-11 DIAGNOSIS — M7540 Impingement syndrome of unspecified shoulder: Secondary | ICD-10-CM | POA: Insufficient documentation

## 2010-12-23 DIAGNOSIS — D509 Iron deficiency anemia, unspecified: Secondary | ICD-10-CM | POA: Insufficient documentation

## 2010-12-23 DIAGNOSIS — K5790 Diverticulosis of intestine, part unspecified, without perforation or abscess without bleeding: Secondary | ICD-10-CM | POA: Insufficient documentation

## 2010-12-29 NOTE — Progress Notes (Signed)
Summary: TINNITUS (room 2)   Vital Signs:  Patient Profile:   50 Years Old Female CC:      tinnitis right ear x 2 weeks Height:     68 inches (170.18 cm) Weight:      211 pounds O2 Sat:      100 % O2 treatment:    Room Air Temp:     98.3 degrees F oral Pulse rate:   68 / minute Resp:     16 per minute BP sitting:   130 / 79  (left arm) Cuff size:   regular  Pt. in pain?   no  Vitals Entered By: Lavell Islam RN (August 31, 2010 12:45 PM)                   Prior Medication List:  PROZAC 40 MG CAPS (FLUOXETINE HCL) 1 tab by mouth qd IRON 28 MG  TABS (FERROUS SULFATE) Take 1 tablet by mouth once a day CALTRATE 600+D 600-400 MG-UNIT  TABS (CALCIUM CARBONATE-VITAMIN D) take two by mouth twice daily VITAMIN D 40981 UNIT CAPS (ERGOCALCIFEROL) 1 capsule by mouth 2 x a wk WELLBUTRIN XL 300 MG XR24H-TAB (BUPROPION HCL) 1 tab by mouth daily CIPROFLOXACIN HCL 250 MG TABS (CIPROFLOXACIN HCL) 1 po two times a day   Current Allergies (reviewed today): SULFAHistory of Present Illness Chief Complaint: tinnitis right ear x 2 weeks History of Present Illness: Patient w/rinnging in the R ear for 2 weeks. INitially she thought it was tied to her hearbeat but it has gotten a lot worse and appears to be a constant sensation of steady pain behind the R ear. She has a HX of arthritis of the neck and shoulder as well.   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) EUSTACHIAN TUBE DYSFUNCTION, RIGHT (ICD-381.81) ASTHMA, WITH ACUTE EXACERBATION (ICD-493.92) KNEE PAIN, RIGHT, ACUTE (ICD-719.46) DISC DISEASE, CERVICAL (ICD-722.4) ARTHRITIS, ACROMIOCLAVICULAR (ICD-716.81) VITAMIN D DEFICIENCY (ICD-268.9) THYROID NODULE (ICD-241.0) OTH&UNSPEC ENDOCRN NUTRIT METAB&IMMUNITY D/O (ICD-V77.99) THYROMEGALY (ICD-240.9) DIVERTICULITIS OF COLON (ICD-562.11) B12 DEFICIENCY (ICD-266.2) CERVICAL LYMPHADENOPATHY (ICD-785.6) EXTERNAL HEMORRHOIDS WITHOUT MENTION COMP (ICD-455.3) BARIATRIC SURGERY STATUS  (ICD-V45.86) OTHER CHRONIC SINUSITIS (ICD-473.8) GOITER, UNSPECIFIED (ICD-240.9) HERNIA, UNILATERAL INGUINAL, W/O OBST/GNGR (ICD-550.90) HERNIA, INCISIONAL VENTRAL W/O OBST/GNGR (ICD-553.21) ALLERGIC RHINITIS (ICD-477.9) SHOULDER PAIN (ICD-719.41) VARICOSE VEINS (ICD-454.9) HYPERCHOLESTEROLEMIA (ICD-272.0) DIVERTICULITIS OF COLON, NOS (ICD-562.11) DEPRESSION, MAJOR, RECURRENT (ICD-296.30)   Current Meds IRON 28 MG  TABS (FERROUS SULFATE) Take 1 tablet by mouth once a day CALTRATE 600+D 600-400 MG-UNIT  TABS (CALCIUM CARBONATE-VITAMIN D) take two by mouth twice daily VITAMIN D 19147 UNIT CAPS (ERGOCALCIFEROL) 1 capsule by mouth 2 x a wk AUGMENTIN 875-125 MG TABS (AMOXICILLIN-POT CLAVULANATE) 1 by mouth 2 times daily FLONASE 50 MCG/ACT SUSP (FLUTICASONE PROPIONATE) 2puff each nostril q day ALLEGRA-D ALLERGY & CONGESTION 180-240 MG XR24H-TAB (FEXOFENADINE-PSEUDOEPHEDRINE) 1 by mouth q day  REVIEW OF SYSTEMS Constitutional Symptoms      Denies fever and chills.  Eyes       Denies eye pain and eye discharge. Ear/Nose/Throat/Mouth       Complains of ear pain.      Denies hearing loss/aids, ear discharge, dizziness, frequent runny nose, sinus problems, sore throat, and hoarseness.  Respiratory       Denies dry cough, productive cough, and wheezing.  Cardiovascular       Denies chest pain.    Gastrointestinal       Complains of nausea/vomiting. Genitourniary       Denies painful urination. Neurological  Complains of headaches.      Denies paralysis and seizures. Musculoskeletal       Denies joint pain and joint stiffness.  Skin       Complains of bruising.  Psych       Complains of temper/anger issues.      Denies mood changes. Other Comments: tinnitis right ear "couple weeks"   Past History:  Family History: Last updated: 02/04/2006 father- alive, HTN, DM  mother healthy, sister and brother healthy  Social History: Last updated: 03/31/2008 Separated from  [place].   3 teenage kids. Walks regularly.  Nonsmoker. 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 denies drinking or recreational drug use  Risk Factors: Smoking Status: never (02/04/2006)  Past Medical History: Reviewed history from 07/28/2010 and no changes required. c spine DDD [id number] Gastric Bypass diverticulitis hemorrhoids (Dr Colin Benton) mild intermittent asthma anxiety  Past Surgical History: Reviewed history from 03/31/2008 and no changes required. Appendectomy conization D&C, heel spur R/L L neuroma surgery lap gastric bypass LTCSx 3 TAH w/o oophorectomy/ (non-cancerous) R inguinal hernia 2008  Dr Colin Benton gen surgery having sigmoid colectomy for diverticulitis in march 2010  Family History: Reviewed history from 02/04/2006 and no changes required. father- alive, HTN, DM  mother healthy, sister and brother healthy  Social History: Reviewed history from 03/31/2008 and no changes required. Separated from  [place].  3 teenage kids. Walks regularly.  Nonsmoker. 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  Follow Up: Follow up on an as needed basis, Follow up with Primary Physician  The patient and/or caregiver has been counseled thoroughly with regard to medications prescribed including dosage, schedule, interactions, rationale for use, and possible side effects and they verbalize understanding.  Diagnoses and expected course of recovery discussed and will return if not improved as expected or if the condition worsens. Patient and/or caregiver verbalized understanding.  Prescriptions: ALLEGRA-D ALLERGY & CONGESTION 180-240 MG XR24H-TAB (FEXOFENADINE-PSEUDOEPHEDRINE) 1 by mouth q day  #30 x 0   Entered and Authorized by:   Hassan Rowan MD   Signed by:   Hassan Rowan MD on 08/31/2010   Method used:   Printed then faxed to ...       [facility]* (retail)       [address]       [place], [place]  [address]       Ph: [phone]       Fax: [phone]   RxID:   [id number] AUGMENTIN 875-125 MG TABS (AMOXICILLIN-POT CLAVULANATE) 1 by mouth 2 times daily  #20 x 0   Entered and Authorized by:  Hassan Rowan MD   Signed by:   Hassan Rowan MD on 08/31/2010   Method used:   Printed then faxed to ...       [facility]* (retail)       [address]       [place], [place]  [address]       Ph: [phone]       Fax: [phone]   RxID:   [id number]   Patient Instructions: 1)  Please schedule a follow-up appointment as needed. 2)  Please schedule an appointment with your primary doctor in :7-14 DAYS IF NOT BETTER MAY NEED IMAGING STUDIES AND FUTHER WORK UP BY ENT 3)  Take your antibiotic as prescribed until ALL of it is gone, but stop if you develop a rash or swelling and contact our office as soon as possible.  Orders Added: 1)  New Patient Level III [47425]

## 2010-12-29 NOTE — Telephone Encounter (Signed)
  Phone Note Outgoing Call Call back at Home Phone [phone] [facility]     Call placed by: Lajean Saver RN,  July 30, 2010 11:37 AM Call placed to: Patient Action Taken: Phone Call Completed Summary of Call: Callback: Culture result given. Patient is improving and taking antibiotic.

## 2010-12-29 NOTE — Progress Notes (Signed)
Summary: Vaginal/Rectal Itching (room 5)   Vital Signs:  Patient Profile:   50 Years Old Female CC:      perineal itching Height:     67 inches (170.18 cm) Weight:      212 pounds O2 Sat:      99 % O2 treatment:    Room Air Temp:     98.3 degrees F oral Pulse rate:   79 / minute Resp:     16 per minute BP sitting:   133 / 78  (left arm) Cuff size:   regular  Pt. in pain?   no  Vitals Entered By: Lavell Islam RN (October 28, 2010 12:25 PM)                   Updated Prior Medication List: IRON 28 MG  TABS (FERROUS SULFATE) Take 1 tablet by mouth once a day CALTRATE 600+D 600-400 MG-UNIT  TABS (CALCIUM CARBONATE-VITAMIN D) take two by mouth twice daily VITAMIN D 11914 UNIT CAPS (ERGOCALCIFEROL) 1 capsule by mouth 2 x a wk ALLEGRA-D ALLERGY & CONGESTION 180-240 MG XR24H-TAB (FEXOFENADINE-PSEUDOEPHEDRINE) 1 by mouth q day FLONASE 50 MCG/ACT SUSP (FLUTICASONE PROPIONATE) 2puff each nostril q day FLONASE 50 MCG/ACT SUSP (FLUTICASONE PROPIONATE) 2 sprays each nostril daily PREDNISONE 20 MG TABS (PREDNISONE) 1 by mouth two times a day for 7 days  Current Allergies (reviewed today): SULFAHistory of Present Illness History from: patient Chief Complaint: perineal itching History of Present Illness: perineal itching intermittently since Jan 2012. Worse X 1 month. Had been using Nystatin/Triamcinalone cream , which helped some, but never resolved it. No vag d/c or bleeding. No fever. No dysuria or hematuria  . No hx STD. S/P Hemorrhoidectomy/sphincterotomy 04/2009, + prior hx of anal fissure. No change of bowel habits. No blood or mucus in stool. No abdominal pain.  REVIEW OF SYSTEMS Constitutional Symptoms      Denies fever, chills, night sweats, weight loss, weight gain, and fatigue.  Eyes       Denies change in vision, eye pain, eye discharge, glasses, contact lenses, and eye surgery. Ear/Nose/Throat/Mouth       Denies hearing loss/aids, change in hearing, ear pain, ear  discharge, dizziness, frequent runny nose, frequent nose bleeds, sinus problems, sore throat, hoarseness, and tooth pain or bleeding.  Respiratory       Denies dry cough, productive cough, wheezing, shortness of breath, asthma, bronchitis, and emphysema/COPD.  Cardiovascular       Denies murmurs, chest pain, and tires easily with exhertion.    Gastrointestinal       Denies stomach pain, nausea/vomiting, diarrhea, constipation, blood in bowel movements, and indigestion. Genitourniary       Denies painful urination, kidney stones, and loss of urinary control. Neurological       Denies paralysis, seizures, and fainting/blackouts. Musculoskeletal       Denies muscle pain, joint pain, joint stiffness, decreased range of motion, redness, swelling, muscle weakness, and gout.  Skin       Denies bruising, unusual mles/lumps or sores, and hair/skin or nail changes.  Psych       Denies mood changes, temper/anger issues, anxiety/stress, speech problems, depression, and sleep problems. Other Comments: perineal itching   Past History:  Past Medical History: Reviewed history from 07/28/2010 and no changes required. c spine DDD [id number] Gastric Bypass diverticulitis hemorrhoids (Dr Colin Benton) mild intermittent asthma anxiety  Past Surgical History: Reviewed history from 03/31/2008 and no changes required. Appendectomy conization D&C, heel spur R/L L  neuroma surgery lap gastric bypass LTCSx 3 TAH w/o oophorectomy/ (non-cancerous) R inguinal hernia 2008  Dr Colin Benton gen surgery having sigmoid colectomy for diverticulitis in march 2010  Family History: Reviewed history from 02/04/2006 and no changes required. father- alive, HTN, DM  mother healthy, sister and brother healthy  Social History: Reviewed history from 03/31/2008 and no changes required. Separated from  [place].  3 teenage kids. Walks regularly.  Nonsmoker. 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 denies drinking or  recreational drug use Physical Exam General appearance: well developed, well nourished, no acute distress Abdomen: soft, non-tender without obvious organomegaly MSE: oriented to time, place, and person  Perineal exam, R lateral decubitus position with nurse chaperone:  Ext genitalia: red, inflammed, no lesions. No d/c. Perineum: red, moist, inflammed skin, consistent with candida, with a few satelitte lesions. No vesicles. Anal inspection: No fissure or lesions seen. Assessment New Problems: ANAL PRURITUS (ICD-698.0) VULVAR CANDIDIASIS (ICD-112.1)  Likely has subacute perineal candidiasis and pruritis ani, partially treated. Will avoid topical steroids to the area, which can prolong the condition. I explained this to pt at length today.  UA wnl  Plan New Medications/Changes: NYSTATIN 100000 UNIT/GM CREA (NYSTATIN) apply to affected area bid  #60 grams x 0, 10/28/2010, Lajean Manes MD DIFLUCAN 200 MG TABS (FLUCONAZOLE) 1 by mouth stat, then 1 by mouth q week for a total of 4 weeks  #4 x 0, 10/28/2010, Lajean Manes MD  New Orders: Est. Patient Level IV [04540] UA Dipstick w/o Micro (automated)  [81003] Planning Comments:   by mouth Diflucan and topical Nystatin. Discussed various hygiene methods. F/U with gyn if no better in 2-3 weeks, sooner if worse or new sxs. Risks, benefits, alternatives discussed. Pt voiced understanding and agreement.   The patient and/or caregiver has been counseled thoroughly with regard to medications prescribed including dosage, schedule, interactions, rationale for use, and possible side effects and they verbalize understanding.  Diagnoses and expected course of recovery discussed and will return if not improved as expected or if the condition worsens. Patient and/or caregiver verbalized understanding.  Prescriptions: NYSTATIN 100000 UNIT/GM CREA (NYSTATIN) apply to affected area bid  #60 grams x 0   Entered and Authorized by:   Lajean Manes MD   Signed  by:   Lajean Manes MD on 10/28/2010   Method used:   Print then Give to Patient   RxID:   [id number] DIFLUCAN 200 MG TABS (FLUCONAZOLE) 1 by mouth stat, then 1 by mouth q week for a total of 4 weeks  #4 x 0   Entered and Authorized by:   Lajean Manes MD   Signed by:   Lajean Manes MD on 10/28/2010   Method used:   Print then Give to Patient   RxID:   [id number]   Orders Added: 1)  Est. Patient Level IV [13244] 2)  UA Dipstick w/o Micro (automated)  [81003]    Laboratory Results   Urine Tests  Date/Time Received: October 28, 2010 12:38 PM  Date/Time Reported: October 28, 2010 12:38 PM   Routine Urinalysis   Color: yellow Appearance: Clear Glucose: negative   (Normal Range: Negative) Bilirubin: negative   (Normal Range: Negative) Ketone: negative   (Normal Range: Negative) Spec. Gravity: <1.005   (Normal Range: 1.003-1.035) Blood: trace-intact   (Normal Range: Negative) pH: 5.5   (Normal Range: 5.0-8.0) Protein: negative   (Normal Range: Negative) Urobilinogen: 0.2   (Normal Range: 0-1) Leukocyte Esterace: negative   (Normal  Range: Negative)    Comments: midstream

## 2010-12-29 NOTE — Letter (Signed)
Summary: Out of Work  [facility]  [address]   [place], [place] [address]   Phone: [phone]  Fax: [phone]    September 10, 2010   Employee:  [name] [employer]    To Whom It May Concern:   For Medical reasons, please excuse the above named employee from work for the following dates:  Start:   09/10/10  Return: 09/12/10    If you need additional information, please feel free to contact our office.         Sincerely,    Clemens Catholic LPN

## 2010-12-29 NOTE — Progress Notes (Signed)
Summary: TINNITUS   Vital Signs:  Patient Profile:   50 Years Old Female CC:      tinnitus, no better Height:     68 inches (170.18 cm) Weight:      212.50 pounds O2 Sat:      99 % O2 treatment:    Room Air Temp:     99.0 degrees F oral Pulse rate:   73 / minute Resp:     16 per minute BP sitting:   114 / 74  (left arm) Cuff size:   large  Vitals Entered By: Clemens Catholic LPN (September 10, 2010 7:02 PM)                  Updated Prior Medication List: IRON 28 MG  TABS (FERROUS SULFATE) Take 1 tablet by mouth once a day CALTRATE 600+D 600-400 MG-UNIT  TABS (CALCIUM CARBONATE-VITAMIN D) take two by mouth twice daily VITAMIN D 16109 UNIT CAPS (ERGOCALCIFEROL) 1 capsule by mouth 2 x a wk FLONASE 50 MCG/ACT SUSP (FLUTICASONE PROPIONATE) 2puff each nostril q day ALLEGRA-D ALLERGY & CONGESTION 180-240 MG XR24H-TAB (FEXOFENADINE-PSEUDOEPHEDRINE) 1 by mouth q day  Current Allergies (reviewed today): SULFAHistory of Present Illness History from: patient Chief Complaint: tinnitus, no better History of Present Illness: Onset 1 month ago. Despite using augmentin and allegra-D for 10 days, still has stable , vague, "drummer" sound or "ringing" in R ear. , "like a helicopter". She states her pcp is working on getting her a referral to an ENT, which may take another 1-2 weeks. She c/o vague lightheadedness, not orthostatic. No syncope, chest pain, vertigo. Denies focal neuro signs. No numbness, No focal weakness. No incontinence.  Reviewed notes of visit of 08/31/10. Also, labs at pcp on 06/27/10: cbc, TSH WNL.  REVIEW OF SYSTEMS Constitutional Symptoms      Denies fever, chills, night sweats, weight loss, weight gain, and fatigue.  Eyes       Denies change in vision, eye pain, eye discharge, glasses, contact lenses, and eye surgery. Ear/Nose/Throat/Mouth       Denies hearing loss/aids, change in hearing, ear pain, ear discharge, dizziness, frequent runny nose, frequent nose bleeds, sinus  problems, sore throat, hoarseness, and tooth pain or bleeding.  Respiratory       Denies dry cough, productive cough, wheezing, shortness of breath, asthma, bronchitis, and emphysema/COPD.  Cardiovascular       Denies murmurs, chest pain, and tires easily with exhertion.    Gastrointestinal       Denies stomach pain, nausea/vomiting, diarrhea, constipation, blood in bowel movements, and indigestion. Genitourniary       Denies painful urination, kidney stones, and loss of urinary control. Neurological       Denies paralysis, seizures, and fainting/blackouts. Musculoskeletal       Denies muscle pain, joint pain, joint stiffness, decreased range of motion, redness, swelling, muscle weakness, and gout.  Skin       Denies bruising, unusual mles/lumps or sores, and hair/skin or nail changes.  Psych       Denies mood changes, temper/anger issues, anxiety/stress, speech problems, depression, and sleep problems. Other Comments: pt c/o tinnitus no better from previous visit.    Past History:  Past Medical History: Reviewed history from 07/28/2010 and no changes required. c spine DDD [id number] Gastric Bypass diverticulitis hemorrhoids (Dr Colin Benton) mild intermittent asthma anxiety  Past Surgical History: Reviewed history from 03/31/2008 and no changes required. Appendectomy conization D&C, heel spur R/L L neuroma surgery lap gastric bypass  LTCSx 3 TAH w/o oophorectomy/ (non-cancerous) R inguinal hernia 2008  Dr Colin Benton gen surgery having sigmoid colectomy for diverticulitis in march 2010  Family History: Reviewed history from 02/04/2006 and no changes required. father- alive, HTN, DM  mother healthy, sister and brother healthy  Social History: Reviewed history from 03/31/2008 and no changes required. Separated from  [place].  3 teenage kids. Walks regularly.  Nonsmoker. 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 denies drinking or recreational drug use Physical  Exam General appearance: well developed, well nourished, no acute distress Head: normocephalic, atraumatic Eyes: conjunctivae and lids normal Pupils: equal, round, reactive to light Ears: normal TMs, no lesions or deformities Nasal: swollen pale turbinates with congestion Oral/Pharynx: tongue normal, posterior pharynx without erythema or exudate Neck: neck supple,  trachea midline, no masses Chest/Lungs: no rales, wheezes, or rhonchi bilateral, breath sounds equal without effort Heart: regular rate and  rhythm, no murmur Abdomen: soft, non-tender without obvious organomegaly Extremities: normal extremities Neurological: cranial nerve II-XII grossly intact and non-focal. Gait, rhomberg, f-n, cerebellar, motor, sensory intact. Heel-toe wnl. Skin: no obvious rashes or lesions MSE: oriented to time, place, and person Assessment  Assessed TINNITUS, RIGHT as unchanged - Lajean Manes MD May have an allergic sinusitis component, but needs ENT evaluation, and I explained this to patient. No evidence of acute neurologic event. Neuro exam intact. Risks, benefits, alternatives discussed. Pt voiced understanding and agreement.   Plan New Medications/Changes: PREDNISONE 20 MG TABS (PREDNISONE) 1 by mouth two times a day for 7 days  #14 x 0, 09/10/2010, Lajean Manes MD FLONASE 50 MCG/ACT SUSP (FLUTICASONE PROPIONATE) 2 sprays each nostril daily  #1 x 0, 09/10/2010, Lajean Manes MD  New Orders: Services provided After hours-Weekends-Holidays [99051] Est. Patient Level IV [09811] Planning Comments:   See above. See ENT for evaluation withinin 1-2 weeks.  Flonase and Prednisone rx'd, and other advice given. Also explained the importance of f/u with pcp. Risks, benefits, alternatives discussed. Pt voiced understanding and agreement.  Follow Up: Follow up with Primary Physician  The patient and/or caregiver has been counseled thoroughly with regard to medications prescribed including dosage, schedule,  interactions, rationale for use, and possible side effects and they verbalize understanding.  Diagnoses and expected course of recovery discussed and will return if not improved as expected or if the condition worsens. Patient and/or caregiver verbalized understanding.  Prescriptions: PREDNISONE 20 MG TABS (PREDNISONE) 1 by mouth two times a day for 7 days  #14 x 0   Entered and Authorized by:   Lajean Manes MD   Signed by:   Lajean Manes MD on 09/10/2010   Method used:   Print then Give to Patient   RxID:   [id number] FLONASE 50 MCG/ACT SUSP (FLUTICASONE PROPIONATE) 2 sprays each nostril daily  #1 x 0   Entered and Authorized by:   Lajean Manes MD   Signed by:   Lajean Manes MD on 09/10/2010   Method used:   Print then Give to Patient   RxID:   [id number]   Orders Added: 1)  Services provided After hours-Weekends-Holidays [99051] 2)  Est. Patient Level IV [40102]

## 2010-12-29 NOTE — Progress Notes (Signed)
Summary: ?uti (room 4)   Vital Signs:  Patient Profile:   50 Years Old Female CC:      dysuria and hematuria x 24 hours Height:     68 inches (170.18 cm) Weight:      206.75 pounds O2 Sat:      100 % O2 treatment:    Room Air Temp:     98.6 degrees F oral Pulse rate:   77 / minute Resp:     16 per minute BP sitting:   109 / 76  (left arm) Cuff size:   large  Pt. in pain?   yes    Location:   bladder  Vitals Entered By: Lavell Islam RN (July 28, 2010 4:12 PM)                   Current Allergies (reviewed today): SULFAHistory of Present Illness Chief Complaint: dysuria and hematuria x 24 hours History of Present Illness:  Subjective:  Patient presents complaining of UTI symptoms for 3 days.  Complains of dysuria, frequency, and urgency.  ? hematuria.  No nocturia.  No abnormal vaginal discharge.  No fever/chills/sweats.  No abdominal pain.  No flank pain.  No nausea/vomiting.  Complains of mild low back ache.  History ot TAH  REVIEW OF SYSTEMS Constitutional Symptoms      Denies fever, chills, night sweats, weight loss, weight gain, and fatigue.  Eyes       Denies change in vision, eye pain, eye discharge, glasses, contact lenses, and eye surgery. Ear/Nose/Throat/Mouth       Denies hearing loss/aids, change in hearing, ear pain, ear discharge, dizziness, frequent runny nose, frequent nose bleeds, sinus problems, sore throat, hoarseness, and tooth pain or bleeding.  Respiratory       Denies dry cough, productive cough, wheezing, shortness of breath, asthma, bronchitis, and emphysema/COPD.  Cardiovascular       Denies murmurs, chest pain, and tires easily with exhertion.    Gastrointestinal       Denies stomach pain, nausea/vomiting, diarrhea, constipation, blood in bowel movements, and indigestion. Genitourniary       Complains of painful urination.      Denies kidney stones and loss of urinary control. Neurological       Denies paralysis, seizures, and  fainting/blackouts. Musculoskeletal       Denies muscle pain, joint pain, joint stiffness, decreased range of motion, redness, swelling, muscle weakness, and gout.  Skin       Denies bruising, unusual mles/lumps or sores, and hair/skin or nail changes.  Psych       Denies mood changes, temper/anger issues, anxiety/stress, speech problems, depression, and sleep problems. Other Comments: dysuria and hematuria x 24 hours   Past History:  Family History: Last updated: 02/04/2006 father- alive, HTN, DM  mother healthy, sister and brother healthy  Social History: Last updated: 03/31/2008 Separated from  [place].  3 teenage kids. Walks regularly.  Nonsmoker. 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 denies drinking or recreational drug use  Past Medical History: c spine DDD [id number] Gastric Bypass diverticulitis hemorrhoids (Dr Colin Benton) mild intermittent asthma anxiety  Past Surgical History: Reviewed history from 03/31/2008 and no changes required. Appendectomy conization D&C, heel spur R/L L neuroma surgery lap gastric bypass LTCSx 3 TAH w/o oophorectomy/ (non-cancerous) R inguinal hernia 2008  Dr Colin Benton gen surgery having sigmoid colectomy for diverticulitis in march 2010  Family History: Reviewed history from 02/04/2006 and no changes required. father- alive,  HTN, DM  mother healthy, sister and brother healthy  Social History: Reviewed history from 03/31/2008 and no changes required. Separated from  [place].  3 teenage kids. Walks regularly.  Nonsmoker. 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 denies drinking or recreational drug use   Objective:  Appearance:  alert and oriented, no acute distress  Eyes:  Pupils are equal, round, and reactive to light and accomodation.  Extraocular movement is intact.  Conjunctivae are not inflamed.  Mouth:  moist mucous membranes  Neck:  Supple.  No adenopathy is present. Lungs:  Clear to auscultation.   Breath sounds are equal.  Heart:  Regular rate and rhythm without murmurs, rubs, or gallops.  Abdomen:   Mild tenderness over bladder without masses or hepatosplenomegaly.  Bowel sounds are present.  No CVA or flank tenderness.  urinalysis (dipstick):  3+ blood, 3+ leuks  Assessment New Problems: DYSURIA (ICD-788.1) ACUTE CYSTITIS (ICD-595.0)   Plan New Medications/Changes: CIPROFLOXACIN HCL 250 MG TABS (CIPROFLOXACIN HCL) 1 po two times a day  #10 x 0, 07/28/2010, Donna Christen MD  New Orders: T-Culture, Urine [[id number]] Urinalysis [81003-65000] Est. Patient Level III [53664] Planning Comments:   Urine culture pending. Begin Cipro. Return for worsening symptoms.  Follow-up with PCP if not improving.   The patient and/or caregiver has been counseled thoroughly with regard to medications prescribed including dosage, schedule, interactions, rationale for use, and possible side effects and they verbalize understanding.  Diagnoses and expected course of recovery discussed and will return if not improved as expected or if the condition worsens. Patient and/or caregiver verbalized understanding.  Prescriptions: CIPROFLOXACIN HCL 250 MG TABS (CIPROFLOXACIN HCL) 1 po two times a day  #10 x 0   Entered and Authorized by:   Donna Christen MD   Signed by:   Donna Christen MD on 07/28/2010   Method used:   Print then Give to Patient   RxID:   [id number]   Orders Added: 1)  T-Culture, Urine [[id number]] 2)  Urinalysis [81003-65000] 3)  Est. Patient Level III [16606]    Laboratory Results   Urine Tests  Date/Time Received: July 28, 2010 4:16 PM  Date/Time Reported: July 28, 2010 4:16 PM   Routine Urinalysis   Color: lt. yellow Appearance: Hazy Glucose: negative   (Normal Range: Negative) Bilirubin: negative   (Normal Range: Negative) Ketone: negative   (Normal Range: Negative) Spec. Gravity: <1.005   (Normal Range: 1.003-1.035) Blood: +3   (Normal Range:  Negative) pH: 5.5   (Normal Range: 5.0-8.0) Protein: +1   (Normal Range: Negative) Urobilinogen: 0.2   (Normal Range: 0-1) Nitrite: negative   (Normal Range: Negative) Leukocyte Esterace: +3   (Normal Range: Negative)    Comments: dysuria and hematuria x 24 hours

## 2011-01-02 DIAGNOSIS — S43439A Superior glenoid labrum lesion of unspecified shoulder, initial encounter: Secondary | ICD-10-CM | POA: Insufficient documentation

## 2011-01-02 DIAGNOSIS — M7520 Bicipital tendinitis, unspecified shoulder: Secondary | ICD-10-CM | POA: Insufficient documentation

## 2011-09-01 DIAGNOSIS — E042 Nontoxic multinodular goiter: Secondary | ICD-10-CM | POA: Insufficient documentation

## 2011-12-08 DIAGNOSIS — M25562 Pain in left knee: Secondary | ICD-10-CM | POA: Insufficient documentation

## 2012-05-14 ENCOUNTER — Emergency Department
Admission: EM | Admit: 2012-05-14 | Discharge: 2012-05-14 | Disposition: A | Discharge status: Home / Self Care | Payer: PRIVATE HEALTH INSURANCE | Source: Home / Self Care | Attending: Family Medicine | Admitting: Family Medicine

## 2012-05-14 DIAGNOSIS — R509 Fever, unspecified: Secondary | ICD-10-CM

## 2012-05-14 DIAGNOSIS — J029 Acute pharyngitis, unspecified: Secondary | ICD-10-CM

## 2012-05-14 DIAGNOSIS — J111 Influenza due to unidentified influenza virus with other respiratory manifestations: Secondary | ICD-10-CM

## 2012-05-14 DIAGNOSIS — H9209 Otalgia, unspecified ear: Secondary | ICD-10-CM

## 2012-05-14 LAB — POCT URINALYSIS DIP (MANUAL ENTRY)
Ketones, POC UA: NEGATIVE
Spec Grav, UA: 1.02 (ref 1.005–1.03)
Urobilinogen, UA: 1 (ref 0–1)
pH, UA: 7 (ref 5–8)

## 2012-05-14 MED ORDER — OSELTAMIVIR PHOSPHATE 75 MG PO CAPS: 75.0000 mg | ORAL_CAPSULE | Freq: Two times a day (BID) | ORAL | Status: DC

## 2012-05-14 NOTE — ED Notes (Signed)
Started yesterday with right ear pain, now c/o sore throat

## 2012-05-14 NOTE — ED Provider Notes (Signed)
History     CSN: [id number]  Arrival date & time 05/14/12  1607   First MD Initiated Contact with Patient 05/14/12 1608      Chief Complaint  Patient presents with  . Sore Throat  . Fever   HPI  URI Symptoms Onset: 1-2 days  Description: fever, generalized malaise, body aches, chills, rhinorrhea, sore throat  Modifying factors:  Recently came back from [place]. No SOB or CP. Baseline asthmatic. Currently asymptomatic.   Symptoms Nasal discharge: yes Fever: yes Sore throat: yes Cough: minimal  Wheezing: no Ear pain: no GI symptoms: no Sick contacts: unsure; pt is a Loss adjuster, chartered  Stiff neck: no Dyspnea: no Rash: n Swallowing difficulty: no  Sinusitis Risk Factors Headache/face pain: no Double sickening: no tooth pain: no  Allergy Risk Factors Sneezing: no Itchy scratchy throat: no Seasonal symptoms: no  Flu Risk Factors Headache: mild muscle aches: mild severe fatigue: mild    Past Medical History  Diagnosis Date  . DDD (degenerative disc disease)     c spine  . History of gastric bypass   . Diverticulitis   . Hemorrhoids     Dr Colin Benton  . Asthma     mild intermittent    Past Surgical History  Procedure Laterality Date  . Appendectomy    . Dilation and curettage of uterus    . Heel spur surgery      R/L  . Neuroma surgery      left  . Lap gaastric bypass    . Ltcs      X 3   . Total abdominal hysterectomy      w/o oophorectomy/ non cancerous  . Hernia repair  2008    R inguinal hernia    Family History  Problem Relation Age of Onset  . Hypertension Father   . Diabetes Father     History  Substance Use Topics  . Smoking status: Never Smoker   . Smokeless tobacco: Not on file  . Alcohol Use: No    OB History   Grav Para Term Preterm Abortions TAB SAB Ect Mult Living                  Review of Systems  All other systems reviewed and are negative.    Allergies  Sulfonamide derivatives  Home Medications    Current Outpatient Rx  Name  Route  Sig  Dispense  Refill  . AMBULATORY NON FORMULARY MEDICATION      Medication Name: Ketoprofen 10% compounded cream.  Apply to affected joints 2-3 x a day as needed for pain   1 Container   2   . AMBULATORY NON FORMULARY MEDICATION      Biest (1:1) 0.625mg ; Progesterone 50mg ;Testosterone 7mg  per gram (1mL)  Apply 1mL qhs rotating sites Biest (1:1) 0.625mg ; Progesterone 50mg ;Testosterone 7mg  per gram (1mL)  Apply 1mL qhs rotating sites   1 Container   2   . buPROPion (WELLBUTRIN XL) 300 MG 24 hr tablet   Oral   Take 1 tablet (300 mg total) by mouth daily.   30 tablet   3   . Calcium Carbonate-Vit D-Min (CALTRATE 600+D PLUS) 600-400 MG-UNIT per tablet   Oral   Take 2 tablets by mouth 2 (two) times daily.           . cyanocobalamin (,VITAMIN B-12,) 1000 MCG/ML injection   Intramuscular   Inject 1,000 mcg into the muscle every 30 (thirty) days.           Marland Kitchen  EXPIRED: DULoxetine (CYMBALTA) 60 MG capsule   Oral   Take 1 capsule (60 mg total) by mouth daily.   30 capsule   3   . ergocalciferol (VITAMIN D2) 50000 UNITS capsule   Oral   Take 50,000 Units by mouth once a week. Take 1 capsule po 2 X a week          . Ferrous Sulfate (IRON) 28 MG TABS   Oral   Take 28 mg by mouth daily.           Marland Kitchen HYDROcodone-acetaminophen (VICODIN) 5-500 MG per tablet      1-2 tabs po qhs prn severe pain   40 tablet   0   . Melatonin 3 MG CAPS   Oral   Take 3 mg by mouth at bedtime.           Marland Kitchen oseltamivir (TAMIFLU) 75 MG capsule   Oral   Take 1 capsule (75 mg total) by mouth 2 (two) times daily.   10 capsule   0     BP 111/74  Pulse 86  Temp(Src) 98.5 F (36.9 C) (Oral)  Resp 20  Ht 5\' 5"  (1.651 m)  Wt 205 lb 8 oz (93.214 kg)  BMI 34.2 kg/m2  SpO2 99%  Physical Exam  Constitutional: She appears well-developed and well-nourished.  HENT:  Head: Normocephalic and atraumatic.  Right Ear: External ear normal.  Left Ear:  External ear normal.  Eyes: Conjunctivae are normal. Pupils are equal, round, and reactive to light.  Neck: Normal range of motion.  Cardiovascular: Normal rate, regular rhythm and normal heart sounds.   Pulmonary/Chest: Effort normal and breath sounds normal. She has no wheezes. She has no rales.  Abdominal: Soft.  Musculoskeletal: Normal range of motion.  Neurological: She is alert.  Skin: Skin is warm.    ED Course  Procedures (including critical care time)  Labs Reviewed  URINE CULTURE  STREP A DNA PROBE  POCT RAPID STREP A (OFFICE)  POCT URINALYSIS DIP (MANUAL ENTRY)   No results found.   1. Flu syndrome       MDM  Flu versus flulike illness. Rapid strep negative. Will start patient on empiric Tamiflu for treatment. Patient also noted to be having a lichen planus flare in the left anterior shin, oral region, and genital area. Was diagnosed one to 2 months ago. Was never placed on steroids for treatment. Discussed with patient followup with her regular Dr. for possible biopsy. Patient declined biopsy today. No clinical indications for steroids as patient is not having an asthmatic flare. This should not effect biopsy results. Plan for patient to followup with her regular Dr. next 3-5 days for general reevaluation of symptoms. No respiratory or cardiopulmonary issues today in setting of recent travel. This was also discussed. Patient is aware. Otherwise followup as needed.    The patient and/or caregiver has been counseled thoroughly with regard to treatment plan and/or medications prescribed including dosage, schedule, interactions, rationale for use, and possible side effects and they verbalize understanding. Diagnoses and expected course of recovery discussed and will return if not improved as expected or if the condition worsens. Patient and/or caregiver verbalized understanding.             Doree Albee, MD 05/14/12 [phone]

## 2012-05-15 LAB — STREP A DNA PROBE: GASP: NEGATIVE

## 2012-05-15 LAB — URINE CULTURE: Colony Count: 5000

## 2012-09-23 DIAGNOSIS — Z8619 Personal history of other infectious and parasitic diseases: Secondary | ICD-10-CM | POA: Insufficient documentation

## 2013-04-10 DIAGNOSIS — L439 Lichen planus, unspecified: Secondary | ICD-10-CM | POA: Insufficient documentation

## 2013-06-03 ENCOUNTER — Encounter: Payer: Self-pay | Admitting: Emergency Medicine

## 2013-06-03 ENCOUNTER — Emergency Department
Admission: EM | Admit: 2013-06-03 | Discharge: 2013-06-03 | Disposition: A | Discharge status: Home / Self Care | Payer: Managed Care, Other (non HMO) | Source: Home / Self Care | Attending: Family Medicine | Admitting: Family Medicine

## 2013-06-03 DIAGNOSIS — J029 Acute pharyngitis, unspecified: Secondary | ICD-10-CM

## 2013-06-03 DIAGNOSIS — K122 Cellulitis and abscess of mouth: Secondary | ICD-10-CM

## 2013-06-03 LAB — POCT RAPID STREP A (OFFICE): RAPID STREP A SCREEN: NEGATIVE

## 2013-06-03 MED ORDER — AMOXICILLIN 500 MG PO CAPS: 500.0000 mg | ORAL_CAPSULE | Freq: Three times a day (TID) | ORAL | Status: DC

## 2013-06-03 NOTE — ED Provider Notes (Signed)
CSN: [id number]     Arrival date & time 06/03/13  1718 History   First MD Initiated Contact with Patient 06/03/13 1801     Chief Complaint  Patient presents with  . Sore Throat      HPI Comments: Patient complains of swelling and soreness in her uvula, and mild sore throat, for 3 days.  The history is provided by the patient.    Past Medical History  Diagnosis Date  . DDD (degenerative disc disease)     c spine  . History of gastric bypass   . Diverticulitis   . Hemorrhoids     Dr Zettie Pho  . Asthma     mild intermittent   Past Surgical History  Procedure Laterality Date  . Appendectomy    . Dilation and curettage of uterus    . Heel spur surgery      R/L  . Neuroma surgery      left  . Lap gaastric bypass    . Ltcs      X 3   . Total abdominal hysterectomy      w/o oophorectomy/ non cancerous  . Hernia repair  2008    R inguinal hernia   Family History  Problem Relation Age of Onset  . Hypertension Father   . Diabetes Father    History  Substance Use Topics  . Smoking status: Never Smoker   . Smokeless tobacco: Not on file  . Alcohol Use: No   OB History   Grav Para Term Preterm Abortions TAB SAB Ect Mult Living                 Review of Systems + sore throat No cough No pleuritic pain No wheezing No nasal congestion No post-nasal drainage No sinus pain/pressure No itchy/red eyes ? earache No hemoptysis No SOB No fever/chills No nausea No vomiting No abdominal pain No diarrhea No urinary symptoms No skin rash + fatigue No myalgias No headache Used OTC meds without relief  Allergies  Sulfonamide derivatives  Home Medications   Prior to Admission medications   Medication Sig Start Date End Date Taking? Authorizing Provider  AMBULATORY NON FORMULARY MEDICATION Medication Name: Ketoprofen 10% compounded cream.  Apply to affected joints 2-3 x a day as needed for pain 05/22/10   Loyal Gambler, DO  AMBULATORY NON FORMULARY MEDICATION Biest  (1:1) 0.625mg ; Progesterone 50mg ;Testosterone 7mg  per gram (87mL)  Apply 51mL qhs rotating sites Biest (1:1) 0.625mg ; Progesterone 50mg ;Testosterone 7mg  per gram (67mL)  Apply 43mL qhs rotating sites 07/21/10   Loyal Gambler, DO  amoxicillin (AMOXIL) 500 MG capsule Take 1 capsule (500 mg total) by mouth 3 (three) times daily. 06/03/13   Kandra Nicolas, MD  buPROPion (WELLBUTRIN XL) 300 MG 24 hr tablet Take 1 tablet (300 mg total) by mouth daily. 06/11/10   Loyal Gambler, DO  Calcium Carbonate-Vit D-Min (CALTRATE 600+D PLUS) 600-400 MG-UNIT per tablet Take 2 tablets by mouth 2 (two) times daily.      Historical Provider, MD  cyanocobalamin (,VITAMIN B-12,) 1000 MCG/ML injection Inject 1,000 mcg into the muscle every 30 (thirty) days.      Historical Provider, MD  DULoxetine (CYMBALTA) 60 MG capsule Take 1 capsule (60 mg total) by mouth daily. 06/26/10 06/26/11  Loyal Gambler, DO  ergocalciferol (VITAMIN D2) 50000 UNITS capsule Take 50,000 Units by mouth once a week. Take 1 capsule po 2 X a week     Historical Provider, MD  Ferrous Sulfate (IRON) 28 MG  TABS Take 28 mg by mouth daily.      Historical Provider, MD  HYDROcodone-acetaminophen (VICODIN) 5-500 MG per tablet 1-2 tabs po qhs prn severe pain 05/22/10   Loyal Gambler, DO  Melatonin 3 MG CAPS Take 3 mg by mouth at bedtime.      Historical Provider, MD  oseltamivir (TAMIFLU) 75 MG capsule Take 1 capsule (75 mg total) by mouth 2 (two) times daily. 05/14/12   Shanda Howells, MD   BP 130/82  Pulse 71  Temp(Src) 98.1 F (36.7 C) (Oral)  Resp 16  Ht 5\' 7"  (1.702 m)  Wt 205 lb (92.987 kg)  BMI 32.10 kg/m2  SpO2 98% Physical Exam Nursing notes and Vital Signs reviewed. Appearance:  Patient appears stated age, and in no acute distress.  Patient is obese (BMI 32.1) Eyes:  Pupils are equal, round, and reactive to light and accomodation.  Extraocular movement is intact.  Conjunctivae are not inflamed  Ears:  Canals normal.  Tympanic membranes normal.  Nose:   Mildly congested turbinates.  No sinus tenderness.    Pharynx:  Erythematous and slightly edematous uvula Neck:  Supple.  Tender shotty posterior nodes are palpated bilaterally  Lungs:  Clear to auscultation.  Breath sounds are equal.  Heart:  Regular rate and rhythm without murmurs, rubs, or gallops.  Abdomen:  Nontender without masses or hepatosplenomegaly.  Bowel sounds are present.  No CVA or flank tenderness.  Extremities:  No edema.  No calf tenderness Skin:  No rash present.   ED Course  Procedures  none    Labs Reviewed  STREP A DNA PROBE  POCT RAPID STREP A (OFFICE) negative         MDM   1. Uvulitis   2. Acute pharyngitis    Throat culture pending. Begin amoxicillin. Try warm salt water gargles for sore throat.  Continue Ibuprofen 200mg , 4 tabs every 8 hours with food. Followup with Family Doctor if not improved in about 4 days.    Kandra Nicolas, MD 06/05/13 1407

## 2013-06-03 NOTE — ED Notes (Signed)
Reports swollen uvula x 3 days; thinks there is blister on back side of it; also has blister on upper lip and inside nostril.

## 2013-06-03 NOTE — Discharge Instructions (Signed)
Try warm salt water gargles for sore throat.  Continue Ibuprofen 200mg , 4 tabs every 8 hours with food.    Uvulitis Uvulitis is redness and soreness (inflammation) of the uvula. The uvula is the small tongue-shaped piece of tissue in the back of your mouth.  CAUSES Infection is a common cause of uvulitis. Infection of the uvula can be either viral or bacterial. Infectious uvulitis usually only occurs in association with another condition, such as inflammation and infection of the mouth or throat. Other causes of uvulitis include:  Trauma to the uvula.  Swelling from excess fluid buildup (edema), which may be an allergic reaction.  Inhalation of irritants, such as chemical agents, smoke, or steam. DIAGNOSIS Your caregiver can usually diagnose uvulitis through a physical examination. Bacterial uvulitis can be diagnosed through the results of the growth of samples of bodily substances taken from your mouth (cultures). HOME CARE INSTRUCTIONS   Rest as much as possible.  Young children may suck on frozen juice bars or frozen ice pops. Older children and adults may gargle with a warm or cold liquid to help soothe the throat. (Mix  tsp of salt in 8 oz of water, or use strong tea.)  Use a cool-mist humidifier to lessen throat irritation and cough.  Drink enough fluids to keep your urine clear or pale yellow.  While the throat is very sore, eat soft or liquid foods such as milk, ice cream, soups, or milk drinks.  Family members who develop a sore throat or fever should have a medical exam or throat culture.  If your child has uvulitis and is taking antibiotic medicine, wait 24 hours or until his or her temperature is near normal (less than 100 F [37.8 C]) before allowing him or her to return to school or day care.  Only take over-the-counter or prescription medicines for pain, discomfort, or fever as directed by your caregiver. Ask when your test results will be ready. Make sure you get  your test results. SEEK MEDICAL CARE IF:   You have an oral temperature above 102 F (38.9 C).  You develop large, tender lumps your the neck.  Your child develops a rash.  You cough up green, yellow-brown, or bloody substances. SEEK IMMEDIATE MEDICAL CARE IF:   You develop any new symptoms, such as vomiting, earache, severe headache, stiff neck, chest pain, or trouble breathing or swallowing.  Your airway is blocked.  You develop more severe throat pain along with drooling or voice changes. Document Released: 08/23/2003 Document Revised: 04/06/2011 Document Reviewed: 03/20/2010 [facility] Patient Information 2014 [place], [place].   Salt Water Gargle This solution will help make your mouth and throat feel better. HOME CARE INSTRUCTIONS   Mix 1 teaspoon of salt in 8 ounces of warm water.  Gargle with this solution as much or often as you need or as directed. Swish and gargle gently if you have any sores or wounds in your mouth.  Do not swallow this mixture. Document Released: 10/17/2003 Document Revised: 04/06/2011 Document Reviewed: 03/09/2008 [facility] Patient Information 2014 [place].

## 2013-06-04 LAB — STREP A DNA PROBE: GASP: NEGATIVE

## 2013-06-06 ENCOUNTER — Telehealth: Payer: Self-pay | Admitting: *Deleted

## 2013-10-13 ENCOUNTER — Encounter: Payer: Self-pay | Admitting: Emergency Medicine

## 2013-10-13 ENCOUNTER — Emergency Department (INDEPENDENT_AMBULATORY_CARE_PROVIDER_SITE_OTHER)
Admission: EM | Admit: 2013-10-13 | Discharge: 2013-10-13 | Disposition: A | Discharge status: Home / Self Care | Payer: Managed Care, Other (non HMO) | Source: Home / Self Care | Attending: Family Medicine | Admitting: Family Medicine

## 2013-10-13 DIAGNOSIS — L738 Other specified follicular disorders: Secondary | ICD-10-CM

## 2013-10-13 DIAGNOSIS — L739 Follicular disorder, unspecified: Secondary | ICD-10-CM

## 2013-10-13 DIAGNOSIS — L678 Other hair color and hair shaft abnormalities: Secondary | ICD-10-CM

## 2013-10-13 MED ORDER — DOXYCYCLINE HYCLATE 100 MG PO CAPS
100.0000 mg | ORAL_CAPSULE | Freq: Two times a day (BID) | ORAL | Status: DC
Start: 2013-10-13 — End: 2014-01-01

## 2013-10-13 NOTE — ED Notes (Signed)
Reports noticing a small spot of broken skin underside of upper right arm about 2 days ago; sore at site and along entire upper arm. No known new allergen contacts.

## 2013-10-13 NOTE — Discharge Instructions (Signed)
Thank you for coming in today. Take doxycycline twice daily for one week. Come back as needed.   Folliculitis  Folliculitis is redness, soreness, and swelling (inflammation) of the hair follicles. This condition can occur anywhere on the body. People with weakened immune systems, diabetes, or obesity have a greater risk of getting folliculitis. CAUSES  Bacterial infection. This is the most common cause.  Fungal infection.  Viral infection.  Contact with certain chemicals, especially oils and tars. Long-term folliculitis can result from bacteria that live in the nostrils. The bacteria may trigger multiple outbreaks of folliculitis over time. SYMPTOMS Folliculitis most commonly occurs on the scalp, thighs, legs, back, buttocks, and areas where hair is shaved frequently. An early sign of folliculitis is a small, white or yellow, pus-filled, itchy lesion (pustule). These lesions appear on a red, inflamed follicle. They are usually less than 0.2 inches (5 mm) wide. When there is an infection of the follicle that goes deeper, it becomes a boil or furuncle. A group of closely packed boils creates a larger lesion (carbuncle). Carbuncles tend to occur in hairy, sweaty areas of the body. DIAGNOSIS  Your caregiver can usually tell what is wrong by doing a physical exam. A sample may be taken from one of the lesions and tested in a lab. This can help determine what is causing your folliculitis. TREATMENT  Treatment may include:  Applying warm compresses to the affected areas.  Taking antibiotic medicines orally or applying them to the skin.  Draining the lesions if they contain a large amount of pus or fluid.  Laser hair removal for cases of long-lasting folliculitis. This helps to prevent regrowth of the hair. HOME CARE INSTRUCTIONS  Apply warm compresses to the affected areas as directed by your caregiver.  If antibiotics are prescribed, take them as directed. Finish them even if you start  to feel better.  You may take over-the-counter medicines to relieve itching.  Do not shave irritated skin.  Follow up with your caregiver as directed. SEEK IMMEDIATE MEDICAL CARE IF:   You have increasing redness, swelling, or pain in the affected area.  You have a fever. MAKE SURE YOU:  Understand these instructions.  Will watch your condition.  Will get help right away if you are not doing well or get worse. Document Released: 03/23/2001 Document Revised: 07/14/2011 Document Reviewed: 04/14/2011 [facility] Patient Information 2015 [place], [place]. This information is not intended to replace advice given to you by your health care provider. Make sure you discuss any questions you have with your health care provider.

## 2013-10-13 NOTE — ED Provider Notes (Signed)
[name] is a 53 y.o. female who presents to Urgent Care today for rash. Patient has 2 painful papules on the right upper arm. This is been present for 2 days. She denies any fevers or chills nausea vomiting or diarrhea. The rash is not extended. No soaps detergents or shampoos. No new medications. No medications tried yet.    Past Medical History  Diagnosis Date  . DDD (degenerative disc disease)     c spine  . History of gastric bypass   . Diverticulitis   . Hemorrhoids     Dr Zettie Pho  . Asthma     mild intermittent   History  Substance Use Topics  . Smoking status: Never Smoker   . Smokeless tobacco: Not on file  . Alcohol Use: No   ROS as above Medications: No current facility-administered medications for this encounter.   Current Outpatient Prescriptions  Medication Sig Dispense Refill  . AMBULATORY NON FORMULARY MEDICATION Medication Name: Ketoprofen 10% compounded cream.  Apply to affected joints 2-3 x a day as needed for pain  1 Container  2  . AMBULATORY NON FORMULARY MEDICATION Biest (1:1) 0.625mg ; Progesterone 50mg ;Testosterone 7mg  per gram (62mL)  Apply 50mL qhs rotating sites Biest (1:1) 0.625mg ; Progesterone 50mg ;Testosterone 7mg  per gram (7mL)  Apply 7mL qhs rotating sites  1 Container  2  . amoxicillin (AMOXIL) 500 MG capsule Take 1 capsule (500 mg total) by mouth 3 (three) times daily.  30 capsule  0  . buPROPion (WELLBUTRIN XL) 300 MG 24 hr tablet Take 1 tablet (300 mg total) by mouth daily.  30 tablet  3  . Calcium Carbonate-Vit D-Min (CALTRATE 600+D PLUS) 600-400 MG-UNIT per tablet Take 2 tablets by mouth 2 (two) times daily.        . cyanocobalamin (,VITAMIN B-12,) 1000 MCG/ML injection Inject 1,000 mcg into the muscle every 30 (thirty) days.        Marland Kitchen doxycycline (VIBRAMYCIN) 100 MG capsule Take 1 capsule (100 mg total) by mouth 2 (two) times daily.  14 capsule  0  . DULoxetine (CYMBALTA) 60 MG capsule Take 1 capsule (60 mg total) by mouth daily.  30  capsule  3  . ergocalciferol (VITAMIN D2) 50000 UNITS capsule Take 50,000 Units by mouth once a week. Take 1 capsule po 2 X a week       . Ferrous Sulfate (IRON) 28 MG TABS Take 28 mg by mouth daily.        Marland Kitchen HYDROcodone-acetaminophen (VICODIN) 5-500 MG per tablet 1-2 tabs po qhs prn severe pain  40 tablet  0  . Melatonin 3 MG CAPS Take 3 mg by mouth at bedtime.        Marland Kitchen oseltamivir (TAMIFLU) 75 MG capsule Take 1 capsule (75 mg total) by mouth 2 (two) times daily.  10 capsule  0    Exam:  BP 119/75  Pulse 90  Temp(Src) 98.2 F (36.8 C) (Oral)  Resp 16  Ht 5\' 7"  (1.702 m)  Wt 212 lb (96.163 kg)  BMI 33.20 kg/m2  SpO2 100% Gen: Well NAD Skin: Right upper arm. 2 small erythematous tender papules  No results found for this or any previous visit (from the past 24 hour(s)). No results found.  Assessment and Plan: 53 y.o. female with folliculitis most likely explanation. Treatment with doxycycline. Follow up as needed.  Discussed warning signs or symptoms. Please see discharge instructions. Patient expresses understanding.     Gregor Hams, MD 10/13/13 [phone]

## 2013-12-16 ENCOUNTER — Emergency Department (INDEPENDENT_AMBULATORY_CARE_PROVIDER_SITE_OTHER)
Admission: EM | Admit: 2013-12-16 | Discharge: 2013-12-16 | Disposition: A | Discharge status: Home / Self Care | Payer: Managed Care, Other (non HMO) | Source: Home / Self Care | Attending: Family Medicine | Admitting: Family Medicine

## 2013-12-16 ENCOUNTER — Encounter: Payer: Self-pay | Admitting: *Deleted

## 2013-12-16 DIAGNOSIS — J0100 Acute maxillary sinusitis, unspecified: Secondary | ICD-10-CM

## 2013-12-16 MED ORDER — PREDNISONE 10 MG PO TABS: 30.0000 mg | ORAL_TABLET | Freq: Every day | ORAL | Status: DC

## 2013-12-16 NOTE — ED Notes (Signed)
[name] currently on a zpak for a sinus infection but has had increasing sinus pain and pressure.  5/10 pain. Also decreased hearing, muffled.

## 2013-12-16 NOTE — Discharge Instructions (Signed)
Thank you for coming in today. Take prednisone as directed.  Continue zpack.  Come back as needed.   Sinusitis Sinusitis is redness, soreness, and inflammation of the paranasal sinuses. Paranasal sinuses are air pockets within the bones of your face (beneath the eyes, the middle of the forehead, or above the eyes). In healthy paranasal sinuses, mucus is able to drain out, and air is able to circulate through them by way of your nose. However, when your paranasal sinuses are inflamed, mucus and air can become trapped. This can allow bacteria and other germs to grow and cause infection. Sinusitis can develop quickly and last only a short time (acute) or continue over a long period (chronic). Sinusitis that lasts for more than 12 weeks is considered chronic.  CAUSES  Causes of sinusitis include:  Allergies.  Structural abnormalities, such as displacement of the cartilage that separates your nostrils (deviated septum), which can decrease the air flow through your nose and sinuses and affect sinus drainage.  Functional abnormalities, such as when the small hairs (cilia) that line your sinuses and help remove mucus do not work properly or are not present. SIGNS AND SYMPTOMS  Symptoms of acute and chronic sinusitis are the same. The primary symptoms are pain and pressure around the affected sinuses. Other symptoms include:  Upper toothache.  Earache.  Headache.  Bad breath.  Decreased sense of smell and taste.  A cough, which worsens when you are lying flat.  Fatigue.  Fever.  Thick drainage from your nose, which often is green and may contain pus (purulent).  Swelling and warmth over the affected sinuses. DIAGNOSIS  Your health care provider will perform a physical exam. During the exam, your health care provider may:  Look in your nose for signs of abnormal growths in your nostrils (nasal polyps).  Tap over the affected sinus to check for signs of infection.  View the inside  of your sinuses (endoscopy) using an imaging device that has a light attached (endoscope). If your health care provider suspects that you have chronic sinusitis, one or more of the following tests may be recommended:  Allergy tests.  Nasal culture. A sample of mucus is taken from your nose, sent to a lab, and screened for bacteria.  Nasal cytology. A sample of mucus is taken from your nose and examined by your health care provider to determine if your sinusitis is related to an allergy. TREATMENT  Most cases of acute sinusitis are related to a viral infection and will resolve on their own within 10 days. Sometimes medicines are prescribed to help relieve symptoms (pain medicine, decongestants, nasal steroid sprays, or saline sprays).  However, for sinusitis related to a bacterial infection, your health care provider will prescribe antibiotic medicines. These are medicines that will help kill the bacteria causing the infection.  Rarely, sinusitis is caused by a fungal infection. In theses cases, your health care provider will prescribe antifungal medicine. For some cases of chronic sinusitis, surgery is needed. Generally, these are cases in which sinusitis recurs more than 3 times per year, despite other treatments. HOME CARE INSTRUCTIONS   Drink plenty of water. Water helps thin the mucus so your sinuses can drain more easily.  Use a humidifier.  Inhale steam 3 to 4 times a day (for example, sit in the bathroom with the shower running).  Apply a warm, moist washcloth to your face 3 to 4 times a day, or as directed by your health care provider.  Use saline nasal  sprays to help moisten and clean your sinuses.  Take medicines only as directed by your health care provider.  If you were prescribed either an antibiotic or antifungal medicine, finish it all even if you start to feel better. SEEK IMMEDIATE MEDICAL CARE IF:  You have increasing pain or severe headaches.  You have nausea,  vomiting, or drowsiness.  You have swelling around your face.  You have vision problems.  You have a stiff neck.  You have difficulty breathing. MAKE SURE YOU:   Understand these instructions.  Will watch your condition.  Will get help right away if you are not doing well or get worse. Document Released: 01/12/2005 Document Revised: 05/29/2013 Document Reviewed: 01/27/2011 [facility] Patient Information 2015 [place], [place]. This information is not intended to replace advice given to you by your health care provider. Make sure you discuss any questions you have with your health care provider.

## 2013-12-16 NOTE — ED Provider Notes (Signed)
[name] is a 53 y.o. female who presents to Urgent Care today for facial pressure and pain associated with ear congestion. Symptoms present for about 2 weeks. Patient is currently several days into a Z-Pak and no better. No fevers or chills nausea vomiting or diarrhea. Over-the-counter medications have not been helpful. No cough or congestion.   Past Medical History  Diagnosis Date  . DDD (degenerative disc disease)     c spine  . History of gastric bypass   . Diverticulitis   . Hemorrhoids     Dr Zettie Pho  . Asthma     mild intermittent   Past Surgical History  Procedure Laterality Date  . Appendectomy    . Dilation and curettage of uterus    . Heel spur surgery      R/L  . Neuroma surgery      left  . Lap gaastric bypass    . Ltcs      X 3   . Total abdominal hysterectomy      w/o oophorectomy/ non cancerous  . Hernia repair  2008    R inguinal hernia   History  Substance Use Topics  . Smoking status: Never Smoker   . Smokeless tobacco: Not on file  . Alcohol Use: No   ROS as above Medications: No current facility-administered medications for this encounter.   Current Outpatient Prescriptions  Medication Sig Dispense Refill  . AMBULATORY NON FORMULARY MEDICATION Medication Name: Ketoprofen 10% compounded cream.  Apply to affected joints 2-3 x a day as needed for pain 1 Container 2  . AMBULATORY NON FORMULARY MEDICATION Biest (1:1) 0.625mg ; Progesterone 50mg ;Testosterone 7mg  per gram (63mL)  Apply 57mL qhs rotating sites Biest (1:1) 0.625mg ; Progesterone 50mg ;Testosterone 7mg  per gram (26mL)  Apply 67mL qhs rotating sites 1 Container 2  . amoxicillin (AMOXIL) 500 MG capsule Take 1 capsule (500 mg total) by mouth 3 (three) times daily. 30 capsule 0  . buPROPion (WELLBUTRIN XL) 300 MG 24 hr tablet Take 1 tablet (300 mg total) by mouth daily. 30 tablet 3  . Calcium Carbonate-Vit D-Min (CALTRATE 600+D PLUS) 600-400 MG-UNIT per tablet Take 2 tablets by mouth 2 (two)  times daily.      . cyanocobalamin (,VITAMIN B-12,) 1000 MCG/ML injection Inject 1,000 mcg into the muscle every 30 (thirty) days.      Marland Kitchen doxycycline (VIBRAMYCIN) 100 MG capsule Take 1 capsule (100 mg total) by mouth 2 (two) times daily. 14 capsule 0  . DULoxetine (CYMBALTA) 60 MG capsule Take 1 capsule (60 mg total) by mouth daily. 30 capsule 3  . ergocalciferol (VITAMIN D2) 50000 UNITS capsule Take 50,000 Units by mouth once a week. Take 1 capsule po 2 X a week     . Ferrous Sulfate (IRON) 28 MG TABS Take 28 mg by mouth daily.      Marland Kitchen HYDROcodone-acetaminophen (VICODIN) 5-500 MG per tablet 1-2 tabs po qhs prn severe pain 40 tablet 0  . Melatonin 3 MG CAPS Take 3 mg by mouth at bedtime.      Marland Kitchen oseltamivir (TAMIFLU) 75 MG capsule Take 1 capsule (75 mg total) by mouth 2 (two) times daily. 10 capsule 0  . predniSONE (DELTASONE) 10 MG tablet Take 3 tablets (30 mg total) by mouth daily. 15 tablet 0   Allergies  Allergen Reactions  . Sulfonamide Derivatives     REACTION: all sulfa meds causes hives     Exam:  BP 110/72 mmHg  Pulse 70  Temp(Src) 97.5  F (36.4 C) (Oral)  Ht 5\' 7"  (1.702 m)  Wt 221 lb (100.245 kg)  BMI 34.61 kg/m2  SpO2 100% Gen: Well NAD HEENT: EOMI,  MMM mildly inflamed nasal turbinates bilaterally. Normal tympanic membranes bilaterally. Normal posterior pharynx. Mildly tender to palpation bilateral maxillary sinuses. Lungs: Normal work of breathing. CTABL Heart: RRR no MRG   No results found for this or any previous visit (from the past 24 hour(s)). No results found.  Assessment and Plan: 53 y.o. female with viral sinusitis. Treatment with prednisone. Continue Z-Pak.  Discussed warning signs or symptoms. Please see discharge instructions. Patient expresses understanding.     Gregor Hams, MD 12/16/13 [phone]

## 2014-01-01 ENCOUNTER — Encounter: Payer: Self-pay | Admitting: Physician Assistant

## 2014-01-01 ENCOUNTER — Encounter: Payer: Self-pay | Admitting: Gastroenterology

## 2014-01-01 ENCOUNTER — Ambulatory Visit (INDEPENDENT_AMBULATORY_CARE_PROVIDER_SITE_OTHER): Payer: 59 | Admitting: Physician Assistant

## 2014-01-01 VITALS — BP 119/73 | HR 73 | Ht 67.0 in | Wt 217.0 lb

## 2014-01-01 DIAGNOSIS — Z1239 Encounter for other screening for malignant neoplasm of breast: Secondary | ICD-10-CM

## 2014-01-01 DIAGNOSIS — E559 Vitamin D deficiency, unspecified: Secondary | ICD-10-CM | POA: Insufficient documentation

## 2014-01-01 DIAGNOSIS — F329 Major depressive disorder, single episode, unspecified: Secondary | ICD-10-CM

## 2014-01-01 DIAGNOSIS — R1011 Right upper quadrant pain: Secondary | ICD-10-CM

## 2014-01-01 DIAGNOSIS — F32A Depression, unspecified: Secondary | ICD-10-CM

## 2014-01-01 DIAGNOSIS — J4520 Mild intermittent asthma, uncomplicated: Secondary | ICD-10-CM | POA: Insufficient documentation

## 2014-01-01 DIAGNOSIS — Z1211 Encounter for screening for malignant neoplasm of colon: Secondary | ICD-10-CM

## 2014-01-01 DIAGNOSIS — R110 Nausea: Secondary | ICD-10-CM

## 2014-01-01 DIAGNOSIS — E039 Hypothyroidism, unspecified: Secondary | ICD-10-CM

## 2014-01-01 LAB — CBC WITH DIFFERENTIAL/PLATELET
BASOS PCT: 1 % (ref 0–1)
Basophils Absolute: 0 10*3/uL (ref 0.0–0.1)
EOS ABS: 0 10*3/uL (ref 0.0–0.7)
Eosinophils Relative: 1 % (ref 0–5)
HEMATOCRIT: 39.3 % (ref 36.0–46.0)
HEMOGLOBIN: 13.2 g/dL (ref 12.0–15.0)
LYMPHS ABS: 1.3 10*3/uL (ref 0.7–4.0)
Lymphocytes Relative: 27 % (ref 12–46)
MCH: 28.8 pg (ref 26.0–34.0)
MCHC: 33.6 g/dL (ref 30.0–36.0)
MCV: 85.6 fL (ref 78.0–100.0)
MPV: 11.2 fL (ref 9.4–12.4)
Monocytes Absolute: 0.3 10*3/uL (ref 0.1–1.0)
Monocytes Relative: 6 % (ref 3–12)
NEUTROS ABS: 3.2 10*3/uL (ref 1.7–7.7)
NEUTROS PCT: 65 % (ref 43–77)
Platelets: 293 10*3/uL (ref 150–400)
RBC: 4.59 MIL/uL (ref 3.87–5.11)
RDW: 15 % (ref 11.5–15.5)
WBC: 4.9 10*3/uL (ref 4.0–10.5)

## 2014-01-01 LAB — COMPLETE METABOLIC PANEL WITH GFR
ALK PHOS: 94 U/L (ref 39–117)
AST: 15 U/L (ref 0–37)
Albumin: 4.2 g/dL (ref 3.5–5.2)
BILIRUBIN TOTAL: 0.4 mg/dL (ref 0.2–1.2)
BUN: 7 mg/dL (ref 6–23)
CO2: 26 meq/L (ref 19–32)
Calcium: 9.6 mg/dL (ref 8.4–10.5)
Chloride: 106 mEq/L (ref 96–112)
Creat: 0.72 mg/dL (ref 0.50–1.10)
GFR, Est African American: >89 mL/min
GFR, Est Non African American: >89 mL/min
Glucose, Bld: 91 mg/dL (ref 70–99)
Potassium: 4.4 mEq/L (ref 3.5–5.3)
SODIUM: 140 meq/L (ref 135–145)
Total Protein: 7.1 g/dL (ref 6.0–8.3)

## 2014-01-01 LAB — T4, FREE: Free T4: 1 ng/dL (ref 0.80–1.80)

## 2014-01-01 LAB — TSH: TSH: 1.172 u[IU]/mL (ref 0.350–4.500)

## 2014-01-01 MED ORDER — PANTOPRAZOLE SODIUM 40 MG PO TBEC: 40.0000 mg | DELAYED_RELEASE_TABLET | Freq: Every day | ORAL | Status: DC

## 2014-01-01 MED ORDER — VORTIOXETINE HBR 10 MG PO TABS: ORAL_TABLET | ORAL | Status: DC

## 2014-01-01 MED ORDER — ALBUTEROL SULFATE 108 (90 BASE) MCG/ACT IN AEPB: 2.0000 | INHALATION_SPRAY | RESPIRATORY_TRACT | Status: DC

## 2014-01-01 NOTE — Progress Notes (Signed)
Subjective:    Patient ID: [name], female    DOB: [birth year], 53 y.o.   MRN: [id number]  HPI Pt presents to the clinic to establish care.  .. Active Ambulatory Problems    Diagnosis Date Noted  . Goiter, Unspecified 12/01/2006  . THYROID NODULE 02/04/2009  . B12 DEFICIENCY 11/02/2007  . VITAMIN D DEFICIENCY 04/17/2009  . HYPERCHOLESTEROLEMIA 11/03/2005  . DEPRESSION, MAJOR, RECURRENT 11/03/2005  . VARICOSE VEINS 11/03/2005  . EXTERNAL HEMORRHOIDS WITHOUT MENTION COMP 10/24/2007  . ALLERGIC RHINITIS 04/06/2006  . HERNIA, UNILATERAL INGUINAL, W/O OBST/GNGR 05/06/2006  . HERNIA, INCISIONAL VENTRAL W/O OBST/GNGR 05/05/2006  . Diverticulitis of Colon (without Mention of Hemorrhage) 11/03/2005  . ARTHRITIS, ACROMIOCLAVICULAR 05/08/2009  . SHOULDER PAIN 02/04/2006  . KNEE PAIN, RIGHT, ACUTE 09/26/2009  . [place] DISEASE, CERVICAL 05/08/2009  . CERVICAL LYMPHADENOPATHY 10/24/2007  . BARIATRIC SURGERY STATUS 03/16/2007  . ASTHMA, WITH ACUTE EXACERBATION 02/05/2010  . Right upper quadrant pain 04/17/2010  . Thyromegaly 06/26/2010  . Fatigue 06/26/2010  . Migraines 06/26/2010  . VULVAR CANDIDIASIS 10/28/2010  . EUSTACHIAN TUBE DYSFUNCTION, RIGHT 08/31/2010  . TINNITUS, RIGHT 08/31/2010  . Asthma, mild intermittent 01/01/2014  . Depression 01/01/2014  . Thyroid activity decreased 01/01/2014  . Vitamin D deficiency 01/01/2014   Resolved Ambulatory Problems    Diagnosis Date Noted  . Other chronic sinusitis 12/01/2006  . ANAL PRURITUS 10/28/2010   Past Medical History  Diagnosis Date  . DDD (degenerative disc disease)   . History of gastric bypass   . Diverticulitis   . Hemorrhoids   . Asthma   . Thyroid disease    .Marland Kitchen Family History  Problem Relation Age of Onset  . Hypertension Father   . Diabetes Father   . Cancer Father     melanoma   .Marland Kitchen History   Social History  . Marital Status: Legally Separated    Spouse Name: N/A    Number of Children: N/A  .  Years of Education: N/A   Occupational History  . Not on file.   Social History Main Topics  . Smoking status: Never Smoker   . Smokeless tobacco: Not on file  . Alcohol Use: No  . Drug Use: No  . Sexual Activity: Not Currently   Other Topics Concern  . Not on file   Social History Narrative   Patient does admit to a few concerns today.  Depression is currently not controlled. She was last on Cymbalta but decided to stop it because she didn't know if it was helping or not. It could've been helping some that she mostly feels like she feels the same down numbness. She has tried numerous SSRIs such as Zoloft and Lexapro. She would like to try something else in this category. She denies any suicidal or homicidal thoughts.  Patient is also having a lot of right upper quadrant pain. She has had this pain for the last 15 years but it had quit for a while. Starts in the right upper quadrant and sometimes radiates to the back. For the last month has been worse. She has had nausea but no vomiting. She is not taking any PPIs. She does admit that is worse after acidic or spicy foods such as pizza. It is also worse after stretching or moving in the wrong direction.   Review of Systems  All other systems reviewed and are negative.      Objective:   Physical Exam  Constitutional: She appears well-developed and well-nourished.  HENT:  Head: Normocephalic and atraumatic.  Cardiovascular: Normal rate, regular rhythm and normal heart sounds.   Pulmonary/Chest: Effort normal and breath sounds normal. She has no wheezes.  Abdominal: Soft. Bowel sounds are normal.  Mild tenderness over RUQ and epigastric area. No rebound or guarding.   Neurological: She is alert.  Skin: Skin is dry.  Psychiatric: She has a normal mood and affect. Her behavior is normal.          Assessment & Plan:  Depression- PHQ-9 was 12.  patient has not felt very controlled on Cymbalta therefore we will try upper and  Brintellix. Samples were given and prescription was given with coupon card. Side effects were discussed and will follow up in 4-6 weeks.  Hypothyroidism will recheck labs today and adjust medications accordingly.  Vitamin D deficiency-we'll recheck levels and adjust dose accordingly.  Right upper quadrant pain/nausea-we'll evaluate with ultrasound of abdomen. Continue on Protonix could increase to twice a day for the next week or so then decrease back down to once a day.  Asthma-refilled breast the clinic to use as needed for shortness of breath and wheezing.  Colonoscopy was scheduled. Mammogram was ordered.

## 2014-01-02 LAB — VITAMIN D 25 HYDROXY (VIT D DEFICIENCY, FRACTURES): VIT D 25 HYDROXY: 21 ng/mL — AB (ref 30–100)

## 2014-01-09 ENCOUNTER — Ambulatory Visit (INDEPENDENT_AMBULATORY_CARE_PROVIDER_SITE_OTHER): Payer: 59

## 2014-01-09 DIAGNOSIS — R110 Nausea: Secondary | ICD-10-CM

## 2014-01-09 DIAGNOSIS — Z9884 Bariatric surgery status: Secondary | ICD-10-CM

## 2014-01-09 DIAGNOSIS — R197 Diarrhea, unspecified: Secondary | ICD-10-CM

## 2014-01-09 DIAGNOSIS — R1011 Right upper quadrant pain: Secondary | ICD-10-CM

## 2014-01-11 ENCOUNTER — Other Ambulatory Visit: Payer: Self-pay | Admitting: *Deleted

## 2014-01-11 MED ORDER — ERGOCALCIFEROL 1.25 MG (50000 UT) PO CAPS: ORAL_CAPSULE | ORAL | Status: DC

## 2014-01-31 ENCOUNTER — Ambulatory Visit: Payer: 59

## 2014-02-05 ENCOUNTER — Ambulatory Visit: Payer: 59 | Admitting: Physician Assistant

## 2014-02-08 ENCOUNTER — Telehealth: Payer: Self-pay | Admitting: *Deleted

## 2014-02-08 NOTE — Telephone Encounter (Signed)
Brintellix prior auth initiated and faxed to [employer] - awaiting decision.

## 2014-02-14 NOTE — Telephone Encounter (Signed)
Prior auth denied for brintellix and I resubmitted again after talking with Lovey Newcomer she has also spoke with them also.

## 2014-02-15 NOTE — Telephone Encounter (Signed)
Brintellix approved and pharmacy/patient notified.

## 2014-02-19 ENCOUNTER — Telehealth: Payer: Self-pay | Admitting: Emergency Medicine

## 2014-02-19 NOTE — Telephone Encounter (Signed)
PA for Brintellix approved.

## 2014-02-26 ENCOUNTER — Ambulatory Visit: Payer: 59 | Admitting: Gastroenterology

## 2014-02-26 NOTE — Progress Notes (Signed)
Patient ID: [name], female   DOB: [birth year], 54 y.o.   MRN: [id number]  [name] chart has been reviewed by Dr. Fuller Plan  and the recommendations are noted below.   Follow-up advised. Schedule patient for next available appointment.  Outcome of communication with the patient:Unable to reach patient. Will mail letter.

## 2014-03-11 ENCOUNTER — Emergency Department (INDEPENDENT_AMBULATORY_CARE_PROVIDER_SITE_OTHER): Payer: Managed Care, Other (non HMO)

## 2014-03-11 ENCOUNTER — Emergency Department
Admission: EM | Admit: 2014-03-11 | Discharge: 2014-03-11 | Disposition: A | Discharge status: Home / Self Care | Payer: Managed Care, Other (non HMO) | Source: Home / Self Care | Attending: Emergency Medicine | Admitting: Emergency Medicine

## 2014-03-11 DIAGNOSIS — M25571 Pain in right ankle and joints of right foot: Secondary | ICD-10-CM

## 2014-03-11 DIAGNOSIS — S93401A Sprain of unspecified ligament of right ankle, initial encounter: Secondary | ICD-10-CM

## 2014-03-11 MED ORDER — IBUPROFEN 200 MG PO TABS: ORAL_TABLET | ORAL | Status: DC

## 2014-03-11 NOTE — ED Provider Notes (Signed)
CSN: [id number]     Arrival date & time 03/11/14  1643 History   First MD Initiated Contact with Patient 03/11/14 1707     Chief Complaint  Patient presents with  . Ankle Pain   (Consider location/radiation/quality/duration/timing/severity/associated sxs/prior Treatment) Patient is a 54 y.o. female presenting with ankle pain.  Ankle Pain Location:  Ankle (Lateral) Lower extremity injury: She recalls no definite injury, but may have occurred while getting up in the middle the night last night.   Ankle location:  R ankle Pain details:    Quality:  Sharp   Radiates to:  Does not radiate   Pain severity now: Moderate to severe.   Onset quality:  Unable to specify   Duration:  12 hours   Timing:  Constant   Progression:  Worsening Chronicity:  New Prior injury to area:  No Relieved by:  Rest Exacerbated by: Inversion and medial torsion of right ankle. Associated symptoms: decreased ROM, stiffness and swelling   Associated symptoms: no fever, no itching, no muscle weakness, no neck pain, no numbness and no tingling   Risk factors: no known bone disorder and no recent illness    Denies history of chronic arthritis, except mild osteoarthritis but she feels it doesn't feel like osteoarthritis. Denies history of gout. Denies calf pain or swelling. Past Medical History  Diagnosis Date  . DDD (degenerative disc disease)     c spine  . History of gastric bypass   . Diverticulosis   . Hemorrhoids     Dr Zettie Pho  . Asthma     mild intermittent  . Thyroid disease   . Hemorrhoids    Past Surgical History  Procedure Laterality Date  . Appendectomy    . Dilation and curettage of uterus    . Heel spur surgery      R/L  . Neuroma surgery      left  . Lap gaastric bypass    . Ltcs      X 3   . Total abdominal hysterectomy      w/o oophorectomy/ non cancerous  . Hernia repair  2008    R inguinal hernia   Family History  Problem Relation Age of Onset  . Hypertension Father   .  Diabetes Father   . Cancer Father     melanoma   History  Substance Use Topics  . Smoking status: Never Smoker   . Smokeless tobacco: Not on file  . Alcohol Use: No   OB History    No data available     Review of Systems  Constitutional: Negative for fever.  Musculoskeletal: Positive for stiffness. Negative for neck pain.  Skin: Negative for itching.  All other systems reviewed and are negative.   Allergies  Sulfonamide derivatives  Home Medications   Prior to Admission medications   Medication Sig Start Date End Date Taking? Authorizing Provider  Albuterol Sulfate (PROAIR RESPICLICK) 035 (90 BASE) MCG/ACT AEPB Inhale 2 puffs into the lungs every 4 (four) hours. 01/01/14   Jade L Breeback, PA-C  Cholecalciferol (VITAMIN D PO) Take 5,000 Int'l Units by mouth daily.    Historical Provider, MD  ergocalciferol (VITAMIN D2) 50000 UNITS capsule Take 1 capsule po 2 X a week 01/11/14   Jade L Breeback, PA-C  Ferrous Sulfate (IRON) 28 MG TABS Take 365 mg by mouth daily.     Historical Provider, MD  ibuprofen (ADVIL,MOTRIN) 200 MG tablet Take three tablets ( 600 milligrams total) every 6 with food  as needed for pain. 03/11/14   Jacqulyn Cane, MD  levothyroxine (SYNTHROID, LEVOTHROID) 50 MCG tablet Take 50 mcg by mouth daily before breakfast.    Historical Provider, MD  pantoprazole (PROTONIX) 40 MG tablet Take 1 tablet (40 mg total) by mouth daily. 01/01/14   Jade L Breeback, PA-C  tiZANidine (ZANAFLEX) 4 MG capsule Take 4 mg by mouth as needed for muscle spasms.    Historical Provider, MD  topiramate (TOPAMAX) 50 MG tablet Take 50 mg by mouth 2 (two) times daily.    Historical Provider, MD  Vortioxetine HBr 10 MG TABS Take one tablet daily. 01/01/14   Jade L Breeback, PA-C   BP 130/79 mmHg  Pulse 79  Temp(Src) 98.2 F (36.8 C) (Oral)  Resp 16  Ht 5\' 7"  (1.702 m)  Wt 210 lb (95.255 kg)  BMI 32.88 kg/m2  SpO2 98% Physical Exam  Constitutional: She is oriented to person, place, and  time. She appears well-developed and well-nourished. No distress.  HENT:  Head: Normocephalic and atraumatic.  Eyes: Conjunctivae and EOM are normal. Pupils are equal, round, and reactive to light. No scleral icterus.  Neck: Normal range of motion.  Cardiovascular: Normal rate.   Pulmonary/Chest: Effort normal.  Abdominal: She exhibits no distension.  Musculoskeletal: Normal range of motion. She exhibits no edema.       Right foot: There is no tenderness, no bony tenderness and normal capillary refill.  Very tender, swollen, ecchymosis right lateral ankle, especially anterior talofibular ligament area. Pain exacerbated by inversion of right ankle and internal rotation. No heat or redness or fluctuance Neurovascular distally intact. No instability. No calf tenderness. Negative Homans sign. Achilles tendon normal.   Neurological: She is alert and oriented to person, place, and time.  Skin: Skin is warm.  Psychiatric: She has a normal mood and affect.  Nursing note and vitals reviewed.   ED Course  Procedures (including critical care time) Labs Review Labs Reviewed - No data to display  Imaging Review Dg Ankle Complete Right  03/11/2014   CLINICAL DATA:  Acute RIGHT ankle pain.  Lateral malleolar pain.  EXAM: RIGHT ANKLE - COMPLETE 3+ VIEW  COMPARISON:  None.  FINDINGS: There is no evidence of fracture, dislocation, or joint effusion. There is no evidence of arthropathy or other focal bone abnormality. Soft tissues are unremarkable.  IMPRESSION: Negative.   Electronically Signed   By: Dereck Ligas M.D.   On: 03/11/2014 17:44     MDM   1. Right ankle sprain, initial encounter    Treatment options discussed, as well as risks, benefits, alternatives. Patient voiced understanding and agreement with the following plans: Encourage rest, ice, compression with ACE bandage, and elevation of injured body part. Aircast applied. She declined any prescription pain med. She prefers to use  OTC ibuprofen 600 mg every 6 hours with food for pain. She works as an Therapist, sports for [employer], and I wrote a note excusing her from work 2/14 through 03/16/2014. Other advice given. Follow-up with ortho in 5 days if not improving, or sooner if symptoms become worse. Precautions discussed. Red flags discussed. Questions invited and answered. Patient voiced understanding and agreement.     Jacqulyn Cane, MD 03/11/14 [phone]

## 2014-03-11 NOTE — ED Notes (Signed)
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

## 2014-03-12 ENCOUNTER — Telehealth: Payer: Self-pay | Admitting: *Deleted

## 2014-03-31 ENCOUNTER — Emergency Department
Admission: EM | Admit: 2014-03-31 | Discharge: 2014-03-31 | Disposition: A | Discharge status: Home / Self Care | Payer: Managed Care, Other (non HMO) | Source: Home / Self Care | Attending: Family Medicine | Admitting: Family Medicine

## 2014-03-31 ENCOUNTER — Encounter: Payer: Self-pay | Admitting: *Deleted

## 2014-03-31 DIAGNOSIS — J101 Influenza due to other identified influenza virus with other respiratory manifestations: Secondary | ICD-10-CM | POA: Diagnosis not present

## 2014-03-31 LAB — POCT INFLUENZA A/B
INFLUENZA B, POC: NEGATIVE
Influenza A, POC: POSITIVE

## 2014-03-31 MED ORDER — HYDROCODONE-ACETAMINOPHEN 5-325 MG PO TABS: 1.0000 | ORAL_TABLET | Freq: Four times a day (QID) | ORAL | Status: DC | PRN

## 2014-03-31 NOTE — ED Notes (Signed)
Pt states 2 days ago she suddenly felt sick, fever, chest congestion, cough.  She has taken ibuprofen to help with the fever.  Fever 103 today.

## 2014-03-31 NOTE — ED Provider Notes (Signed)
[name] is a 54 y.o. female who presents to Urgent Care today for fevers chills bodyaches coughing congestion. Patient has had a mild cough and congestion for the past week or so. She was seen by her primary care doctor yesterday after she suddenly worsened and was given Augmentin for presumed sinus infection with second sickening. She has not improved at all. She developed a significant fever this morning. She has tried some over-the-counter medications as well. No vomiting or trouble breathing. No chest pains or palpitations or abdominal pain. Patient has work on February 29 and has not yet returned to work.   Past Medical History  Diagnosis Date  . DDD (degenerative disc disease)     c spine  . History of gastric bypass   . Diverticulosis   . Hemorrhoids     Dr Zettie Pho  . Asthma     mild intermittent  . Thyroid disease   . Hemorrhoids    Past Surgical History  Procedure Laterality Date  . Appendectomy    . Dilation and curettage of uterus    . Heel spur surgery      R/L  . Neuroma surgery      left  . Lap gaastric bypass    . Ltcs      X 3   . Total abdominal hysterectomy      w/o oophorectomy/ non cancerous  . Hernia repair  2008    R inguinal hernia   History  Substance Use Topics  . Smoking status: Never Smoker   . Smokeless tobacco: Never Used  . Alcohol Use: No   ROS as above Medications: No current facility-administered medications for this encounter.   Current Outpatient Prescriptions  Medication Sig Dispense Refill  . amoxicillin-clavulanate (AUGMENTIN) 875-125 MG per tablet Take 1 tablet by mouth 2 (two) times daily.    . Albuterol Sulfate (PROAIR RESPICLICK) 355 (90 BASE) MCG/ACT AEPB Inhale 2 puffs into the lungs every 4 (four) hours. 30 each 1  . Cholecalciferol (VITAMIN D PO) Take 5,000 Int'l Units by mouth daily.    . ergocalciferol (VITAMIN D2) 50000 UNITS capsule Take 1 capsule po 2 X a week 16 capsule 0  . Ferrous Sulfate (IRON) 28 MG TABS  Take 365 mg by mouth daily.     Marland Kitchen HYDROcodone-acetaminophen (NORCO/VICODIN) 5-325 MG per tablet Take 1 tablet by mouth every 6 (six) hours as needed for moderate pain (or cough). 15 tablet 0  . ibuprofen (ADVIL,MOTRIN) 200 MG tablet Take three tablets ( 600 milligrams total) every 6 with food as needed for pain. 30 tablet 0  . levothyroxine (SYNTHROID, LEVOTHROID) 50 MCG tablet Take 50 mcg by mouth daily before breakfast.    . pantoprazole (PROTONIX) 40 MG tablet Take 1 tablet (40 mg total) by mouth daily. 30 tablet 3  . tiZANidine (ZANAFLEX) 4 MG capsule Take 4 mg by mouth as needed for muscle spasms.    Marland Kitchen topiramate (TOPAMAX) 50 MG tablet Take 50 mg by mouth 2 (two) times daily.    . Vortioxetine HBr 10 MG TABS Take one tablet daily. 17 tablet 0   Allergies  Allergen Reactions  . Sulfonamide Derivatives     REACTION: all sulfa meds causes hives     Exam:  BP 109/74 mmHg  Pulse 97  Temp(Src) 103 F (39.4 C) (Oral)  Ht 5\' 7"  (1.702 m)  Wt 218 lb (98.884 kg)  BMI 34.14 kg/m2  SpO2 97% Gen: Well NAD HEENT: EOMI,  MMM clear  nasal discharge, cobblestoning of posterior pharynx: Normal pinnae membranes bilaterally Lungs: Normal work of breathing. CTABL Heart: RRR no MRG Abd: NABS, Soft. Nondistended, Nontender Exts: Brisk capillary refill, warm and well perfused.   Results for orders placed or performed during the hospital encounter of 03/31/14 (from the past 24 hour(s))  POCT Influenza A/B     Status: None   Collection Time: 03/31/14  3:32 PM  Result Value Ref Range   Influenza A, POC Positive    Influenza B, POC Negative    No results found.  Assessment and Plan: 54 y.o. female with influenza. Treat with NSAIDs and Norco. Patient is outside of treatment window for Tamiflu.  Discussed warning signs or symptoms. Please see discharge instructions. Patient expresses understanding.     Gregor Hams, MD 03/31/14 1540

## 2014-03-31 NOTE — Discharge Instructions (Signed)
Thank you for coming in today. Take Voltaren twice daily or 2 Aleve twice daily for pain fevers and chills. Use Norco for severe pain or cough. Return as needed. Call or go to the emergency room if you get worse, have trouble breathing, have chest pains, or palpitations.   Influenza Influenza ("the flu") is a viral infection of the respiratory tract. It occurs more often in winter months because people spend more time in close contact with one another. Influenza can make you feel very sick. Influenza easily spreads from person to person (contagious). CAUSES  Influenza is caused by a virus that infects the respiratory tract. You can catch the virus by breathing in droplets from an infected person's cough or sneeze. You can also catch the virus by touching something that was recently contaminated with the virus and then touching your mouth, nose, or eyes. RISKS AND COMPLICATIONS You may be at risk for a more severe case of influenza if you smoke cigarettes, have diabetes, have chronic heart disease (such as heart failure) or lung disease (such as asthma), or if you have a weakened immune system. Elderly people and pregnant women are also at risk for more serious infections. The most common problem of influenza is a lung infection (pneumonia). Sometimes, this problem can require emergency medical care and may be life threatening. SIGNS AND SYMPTOMS  Symptoms typically last 4 to 10 days and may include:  Fever.  Chills.  Headache, body aches, and muscle aches.  Sore throat.  Chest discomfort and cough.  Poor appetite.  Weakness or feeling tired.  Dizziness.  Nausea or vomiting. DIAGNOSIS  Diagnosis of influenza is often made based on your history and a physical exam. A nose or throat swab test can be done to confirm the diagnosis. TREATMENT  In mild cases, influenza goes away on its own. Treatment is directed at relieving symptoms. For more severe cases, your health care provider may  prescribe antiviral medicines to shorten the sickness. Antibiotic medicines are not effective because the infection is caused by a virus, not by bacteria. HOME CARE INSTRUCTIONS  Take medicines only as directed by your health care provider.  Use a cool mist humidifier to make breathing easier.  Get plenty of rest until your temperature returns to normal. This usually takes 3 to 4 days.  Drink enough fluid to keep your urine clear or pale yellow.  Cover yourmouth and nosewhen coughing or sneezing,and wash your handswellto prevent thevirusfrom spreading.  Stay homefromwork orschool untilthe fever is gonefor at least 56full day. PREVENTION  An annual influenza vaccination (flu shot) is the best way to avoid getting influenza. An annual flu shot is now routinely recommended for all adults in the [place] IF:  You experiencechest pain, yourcough worsens,or you producemore mucus.  Youhave nausea,vomiting, ordiarrhea.  Your fever returns or gets worse. SEEK IMMEDIATE MEDICAL CARE IF:  You havetrouble breathing, you become short of breath,or your skin ornails becomebluish.  You have severe painor stiffnessin the neck.  You develop a sudden headache, or pain in the face or ear.  You have nausea or vomiting that you cannot control. MAKE SURE YOU:   Understand these instructions.  Will watch your condition.  Will get help right away if you are not doing well or get worse. Document Released: 01/10/2000 Document Revised: 05/29/2013 Document Reviewed: 04/13/2011 [facility] Patient Information 2015 [place], [place]. This information is not intended to replace advice given to you by your health care provider. Make sure you  discuss any questions you have with your health care provider. ° °

## 2014-04-11 ENCOUNTER — Telehealth: Payer: Self-pay | Admitting: Gastroenterology

## 2014-04-11 NOTE — Telephone Encounter (Signed)
-----   Message from Marzella Schlein, [place] sent at 02/26/2014  3:25 PM EST ----- Bill patient for no show

## 2014-06-04 DIAGNOSIS — S52502A Unspecified fracture of the lower end of left radius, initial encounter for closed fracture: Secondary | ICD-10-CM | POA: Insufficient documentation

## 2014-08-17 ENCOUNTER — Encounter: Payer: Self-pay | Admitting: Emergency Medicine

## 2014-08-17 ENCOUNTER — Emergency Department (INDEPENDENT_AMBULATORY_CARE_PROVIDER_SITE_OTHER)
Admission: EM | Admit: 2014-08-17 | Discharge: 2014-08-17 | Disposition: A | Discharge status: Home / Self Care | Payer: PRIVATE HEALTH INSURANCE | Source: Home / Self Care | Attending: Family Medicine | Admitting: Family Medicine

## 2014-08-17 DIAGNOSIS — H6092 Unspecified otitis externa, left ear: Secondary | ICD-10-CM | POA: Diagnosis not present

## 2014-08-17 MED ORDER — NEOMYCIN-POLYMYXIN-HC 3.5-10000-1 OT SUSP: 4.0000 [drp] | Freq: Four times a day (QID) | OTIC | Status: DC

## 2014-08-17 NOTE — ED Provider Notes (Signed)
CSN: [id number]     Arrival date & time 08/17/14  1633 History   First MD Initiated Contact with Patient 08/17/14 1726     Chief Complaint  Patient presents with  . Otalgia      HPI Comments: Patient complains of pain in her left ear for two days.  There has been no drainage from the ear.  No recent cold or nasal congestion.    The history is provided by the patient.    Past Medical History  Diagnosis Date  . DDD (degenerative disc disease)     c spine  . History of gastric bypass   . Diverticulosis   . Hemorrhoids     Dr Zettie Pho  . Asthma     mild intermittent  . Thyroid disease   . Hemorrhoids    Past Surgical History  Procedure Laterality Date  . Appendectomy    . Dilation and curettage of uterus    . Heel spur surgery      R/L  . Neuroma surgery      left  . Lap gaastric bypass    . Ltcs      X 3   . Total abdominal hysterectomy      w/o oophorectomy/ non cancerous  . Hernia repair  2008    R inguinal hernia   Family History  Problem Relation Age of Onset  . Hypertension Father   . Diabetes Father   . Cancer Father     melanoma   History  Substance Use Topics  . Smoking status: Never Smoker   . Smokeless tobacco: Never Used  . Alcohol Use: No   OB History    No data available     Review of Systems No sore throat No cough No pleuritic pain No wheezing No nasal congestion No post-nasal drainage No sinus pain/pressure No itchy/red eyes + left earache No hemoptysis No SOB No fever/chills No nausea No vomiting No abdominal pain No diarrhea No urinary symptoms No skin rash No fatigue No myalgias No headache    Allergies  Sulfonamide derivatives  Home Medications   Prior to Admission medications   Medication Sig Start Date End Date Taking? Authorizing Provider  Albuterol Sulfate (PROAIR RESPICLICK) 166 (90 BASE) MCG/ACT AEPB Inhale 2 puffs into the lungs every 4 (four) hours. 01/01/14   Jade L Breeback, PA-C  amoxicillin-clavulanate  (AUGMENTIN) 875-125 MG per tablet Take 1 tablet by mouth 2 (two) times daily.    Historical Provider, MD  Cholecalciferol (VITAMIN D PO) Take 5,000 Int'l Units by mouth daily.    Historical Provider, MD  ergocalciferol (VITAMIN D2) 50000 UNITS capsule Take 1 capsule po 2 X a week 01/11/14   Jade L Breeback, PA-C  Ferrous Sulfate (IRON) 28 MG TABS Take 365 mg by mouth daily.     Historical Provider, MD  HYDROcodone-acetaminophen (NORCO/VICODIN) 5-325 MG per tablet Take 1 tablet by mouth every 6 (six) hours as needed for moderate pain (or cough). 03/31/14   Gregor Hams, MD  ibuprofen (ADVIL,MOTRIN) 200 MG tablet Take three tablets ( 600 milligrams total) every 6 with food as needed for pain. 03/11/14   Jacqulyn Cane, MD  levothyroxine (SYNTHROID, LEVOTHROID) 50 MCG tablet Take 50 mcg by mouth daily before breakfast.    Historical Provider, MD  neomycin-polymyxin-hydrocortisone (CORTISPORIN) 3.5-10000-1 otic suspension Place 4 drops into the left ear 4 (four) times daily. 08/17/14   Kandra Nicolas, MD  pantoprazole (PROTONIX) 40 MG tablet Take 1 tablet (40  mg total) by mouth daily. 01/01/14   Jade L Breeback, PA-C  tiZANidine (ZANAFLEX) 4 MG capsule Take 4 mg by mouth as needed for muscle spasms.    Historical Provider, MD  topiramate (TOPAMAX) 50 MG tablet Take 50 mg by mouth 2 (two) times daily.    Historical Provider, MD  Vortioxetine HBr 10 MG TABS Take one tablet daily. 01/01/14   Jade L Breeback, PA-C   BP 109/70 mmHg  Pulse 75  Temp(Src) 98.2 F (36.8 C) (Oral)  Resp 18  Ht 5\' 7"  (1.702 m)  Wt 210 lb (95.255 kg)  BMI 32.88 kg/m2  SpO2 98% Physical Exam Nursing notes and Vital Signs reviewed. Appearance:  Patient appears stated age, and in no acute distress.  Patient is obese (BMI 32.9) Eyes:  Pupils are equal, round, and reactive to light and accomodation.  Extraocular movement is intact.  Conjunctivae are not inflamed  Ears:  Canals appear normal but patient has pain with insertion of  speculum into the left canal.  Tympanic membranes normal.  There is tenderness to palpation over the left TMJ. Nose:  Mildly congested turbinates.  No sinus tenderness.   Pharynx:  Normal Neck:  Supple.  No adenopathy. Skin:  No rash present.   ED Course  Procedures  None   Labs Reviewed -   Tympanogram normal both ears       MDM   1. Left otitis externa; ?TMJ pain also    Begin Cortisporin Otic Suspension QID Apply ice pack to left ear 3 to 4 times daily for about 20 minutes.  May take Ibuprofen 200mg , 4 tabs every 8 hours with food.  Followup with ENT if not improved one week.    Kandra Nicolas, MD 08/19/14 [phone]

## 2014-08-17 NOTE — Discharge Instructions (Signed)
Apply ice pack to left ear 3 to 4 times daily for about 20 minutes.  May take Ibuprofen 200mg , 4 tabs every 8 hours with food.    Otitis Externa Otitis externa is a bacterial or fungal infection of the outer ear canal. This is the area from the eardrum to the outside of the ear. Otitis externa is sometimes called "swimmer's ear." CAUSES  Possible causes of infection include:  Swimming in dirty water.  Moisture remaining in the ear after swimming or bathing.  Mild injury (trauma) to the ear.  Objects stuck in the ear (foreign body).  Cuts or scrapes (abrasions) on the outside of the ear. SIGNS AND SYMPTOMS  The first symptom of infection is often itching in the ear canal. Later signs and symptoms may include swelling and redness of the ear canal, ear pain, and yellowish-white fluid (pus) coming from the ear. The ear pain may be worse when pulling on the earlobe. DIAGNOSIS  Your health care provider will perform a physical exam. A sample of fluid may be taken from the ear and examined for bacteria or fungi. TREATMENT  Antibiotic ear drops are often given for 10 to 14 days. Treatment may also include pain medicine or corticosteroids to reduce itching and swelling. HOME CARE INSTRUCTIONS   Apply antibiotic ear drops to the ear canal as prescribed by your health care provider.  Take medicines only as directed by your health care provider.  If you have diabetes, follow any additional treatment instructions from your health care provider.  Keep all follow-up visits as directed by your health care provider. PREVENTION   Keep your ear dry. Use the corner of a towel to absorb water out of the ear canal after swimming or bathing.  Avoid scratching or putting objects inside your ear. This can damage the ear canal or remove the protective wax that lines the canal. This makes it easier for bacteria and fungi to grow.  Avoid swimming in lakes, polluted water, or poorly chlorinated  pools.  You may use ear drops made of rubbing alcohol and vinegar after swimming. Combine equal parts of white vinegar and alcohol in a bottle. Put 3 or 4 drops into each ear after swimming. SEEK MEDICAL CARE IF:   You have a fever.  Your ear is still red, swollen, painful, or draining pus after 3 days.  Your redness, swelling, or pain gets worse.  You have a severe headache.  You have redness, swelling, pain, or tenderness in the area behind your ear. MAKE SURE YOU:   Understand these instructions.  Will watch your condition.  Will get help right away if you are not doing well or get worse. Document Released: 01/12/2005 Document Revised: 05/29/2013 Document Reviewed: 01/29/2011 [facility] Patient Information 2015 [place], [place]. This information is not intended to replace advice given to you by your health care provider. Make sure you discuss any questions you have with your health care provider.

## 2014-08-17 NOTE — ED Notes (Signed)
Patient states she has pain in outer left ear with some redness and swelling getting worse over past 2 days. Took ibuprofen 600mg  at 0930 today.

## 2015-02-18 ENCOUNTER — Encounter: Payer: Self-pay | Admitting: Gastroenterology

## 2015-03-22 ENCOUNTER — Encounter: Payer: Self-pay | Admitting: Physician Assistant

## 2015-03-22 ENCOUNTER — Ambulatory Visit (INDEPENDENT_AMBULATORY_CARE_PROVIDER_SITE_OTHER): Payer: Commercial Managed Care - HMO | Admitting: Physician Assistant

## 2015-03-22 VITALS — BP 125/52 | HR 68 | Ht 67.0 in | Wt 217.0 lb

## 2015-03-22 DIAGNOSIS — Z131 Encounter for screening for diabetes mellitus: Secondary | ICD-10-CM

## 2015-03-22 DIAGNOSIS — E669 Obesity, unspecified: Secondary | ICD-10-CM

## 2015-03-22 DIAGNOSIS — Z114 Encounter for screening for human immunodeficiency virus [HIV]: Secondary | ICD-10-CM | POA: Diagnosis not present

## 2015-03-22 DIAGNOSIS — Z13220 Encounter for screening for lipoid disorders: Secondary | ICD-10-CM | POA: Diagnosis not present

## 2015-03-22 DIAGNOSIS — Z1239 Encounter for other screening for malignant neoplasm of breast: Secondary | ICD-10-CM

## 2015-03-22 DIAGNOSIS — Z0000 Encounter for general adult medical examination without abnormal findings: Secondary | ICD-10-CM

## 2015-03-22 DIAGNOSIS — R5383 Other fatigue: Secondary | ICD-10-CM

## 2015-03-22 DIAGNOSIS — J0100 Acute maxillary sinusitis, unspecified: Secondary | ICD-10-CM

## 2015-03-22 DIAGNOSIS — Z1159 Encounter for screening for other viral diseases: Secondary | ICD-10-CM

## 2015-03-22 MED ORDER — PHENTERMINE HCL 37.5 MG PO TABS: 37.5000 mg | ORAL_TABLET | Freq: Every day | ORAL | Status: DC

## 2015-03-22 MED ORDER — AMOXICILLIN-POT CLAVULANATE 875-125 MG PO TABS: 1.0000 | ORAL_TABLET | Freq: Two times a day (BID) | ORAL | Status: DC

## 2015-03-22 NOTE — Patient Instructions (Signed)
Keeping You Healthy  Get These Tests  Blood Pressure- Have your blood pressure checked by your healthcare provider at least once a year.  Normal blood pressure is 120/80.  Weight- Have your body mass index (BMI) calculated to screen for obesity.  BMI is a measure of body fat based on height and weight.  You can calculate your own BMI at www.nhlbisupport.com/bmi/  Cholesterol- Have your cholesterol checked every year.  Diabetes- Have your blood sugar checked every year if you have high blood pressure, high cholesterol, a family history of diabetes or if you are overweight.  Pap Test - Have a pap test every 1 to 5 years if you have been sexually active.  If you are older than 65 and recent pap tests have been normal you may not need additional pap tests.  In addition, if you have had a hysterectomy  for benign disease additional pap tests are not necessary.  Mammogram-Yearly mammograms are essential for early detection of breast cancer  Screening for Colon Cancer- Colonoscopy starting at age 50. Screening may begin sooner depending on your family history and other health conditions.  Follow up colonoscopy as directed by your Gastroenterologist.  Screening for Osteoporosis- Screening begins at age 65 with bone density scanning, sooner if you are at higher risk for developing Osteoporosis.  Get these medicines  Calcium with Vitamin D- Your body requires 1200-1500 mg of Calcium a day and 800-1000 IU of Vitamin D a day.  You can only absorb 500 mg of Calcium at a time therefore Calcium must be taken in 2 or 3 separate doses throughout the day.  Hormones- Hormone therapy has been associated with increased risk for certain cancers and heart disease.  Talk to your healthcare provider about if you need relief from menopausal symptoms.  Aspirin- Ask your healthcare provider about taking Aspirin to prevent Heart Disease and Stroke.  Get these Immuniztions  Flu shot- Every fall  Pneumonia shot-  Once after the age of 65; if you are younger ask your healthcare provider if you need a pneumonia shot.  Tetanus- Every ten years.  Zostavax- Once after the age of 60 to prevent shingles.  Take these steps  Don't smoke- Your healthcare provider can help you quit. For tips on how to quit, ask your healthcare provider or go to www.smokefree.gov or call 1-800 QUIT-NOW.  Be physically active- Exercise 5 days a week for a minimum of 30 minutes.  If you are not already physically active, start slow and gradually work up to 30 minutes of moderate physical activity.  Try walking, dancing, bike riding, swimming, etc.  Eat a healthy diet- Eat a variety of healthy foods such as fruits, vegetables, whole grains, low fat milk, low fat cheeses, yogurt, lean meats, chicken, fish, eggs, dried beans, tofu, etc.  For more information go to www.thenutritionsource.org  Dental visit- Brush and floss teeth twice daily; visit your dentist twice a year.  Eye exam- Visit your Optometrist or Ophthalmologist yearly.  Drink alcohol in moderation- Limit alcohol intake to one drink or less a day.  Never drink and drive.  Depression- Your emotional health is as important as your physical health.  If you're feeling down or losing interest in things you normally enjoy, please talk to your healthcare provider.  Seat Belts- can save your life; always wear one  Smoke/Carbon Monoxide detectors- These detectors need to be installed on the appropriate level of your home.  Replace batteries at least once a year.  Violence- If   anyone is threatening or hurting you, please tell your healthcare provider. Living Will/ Health care power of attorney- Discuss with your healthcare provider and family.Liraglutide injection (Weight Management) What is this medicine? LIRAGLUTIDE (LIR a GLOO tide) is used with a reduced calorie diet and exercise to help you lose weight. This medicine may be used for other purposes; ask your health care  provider or pharmacist if you have questions. What should I tell my health care provider before I take this medicine? They need to know if you have any of these conditions: -endocrine tumors (MEN 2) or if someone in your family had these tumors -gallstones -high cholesterol -history of alcohol abuse problem -history of pancreatitis -kidney disease or if you are on dialysis -liver disease -previous swelling of the tongue, face, or lips with difficulty breathing, difficulty swallowing, hoarseness, or tightening of the throat -stomach problems -suicidal thoughts, plans, or attempt; a previous suicide attempt by you or a family member -thyroid cancer or if someone in your family had thyroid cancer -an unusual or allergic reaction to liraglutide, medicines, foods, dyes, or preservatives -pregnant or trying to get pregnant -breast-feeding How should I use this medicine? This medicine is for injection under the skin of your upper leg, stomach area, or upper arm. You will be taught how to prepare and give this medicine. Use exactly as directed. Take your medicine at regular intervals. Do not take it more often than directed. It is important that you put your used needles and syringes in a special sharps container. Do not put them in a trash can. If you do not have a sharps container, call your pharmacist or healthcare provider to get one. A special MedGuide will be given to you by the pharmacist with each prescription and refill. Be sure to read this information carefully each time. Talk to your pediatrician regarding the use of this medicine in children. Special care may be needed. Overdosage: If you think you have taken too much of this medicine contact a poison control center or emergency room at once. NOTE: This medicine is only for you. Do not share this medicine with others. What if I miss a dose? If you miss a dose, take it as soon as you can. If it is almost time for your next dose, take  only that dose. Do not take double or extra doses. If you miss your dose for 3 days or more, call your doctor or health care professional to talk about how to restart this medicine. What may interact with this medicine? -acetaminophen -atorvastatin -birth control pills -digoxin -griseofulvin -lisinopril This list may not describe all possible interactions. Give your health care provider a list of all the medicines, herbs, non-prescription drugs, or dietary supplements you use. Also tell them if you smoke, drink alcohol, or use illegal drugs. Some items may interact with your medicine. What should I watch for while using this medicine? Visit your doctor or health care professional for regular checks on your progress. This medicine is intended to be used in addition to a healthy diet and appropriate exercise. The best results are achieved this way. Do not increase or in any way change your dose without consulting your doctor or health care professional. This medicine may affect blood sugar levels. If you have diabetes, check with your doctor or health care professional before you change your diet or the dose of your diabetic medicine. Patients and their families should watch out for worsening depression or thoughts of suicide. Also watch  out for sudden changes in feelings such as feeling anxious, agitated, panicky, irritable, hostile, aggressive, impulsive, severely restless, overly excited and hyperactive, or not being able to sleep. If this happens, especially at the beginning of treatment or after a change in dose, call your health care professional. What side effects may I notice from receiving this medicine? Side effects that you should report to your doctor or health care professional as soon as possible: -allergic reactions like skin rash, itching or hives, swelling of the face, lips, or tongue -breathing problems -fever, chills -loss of appetite -signs and symptoms of low blood sugar such as  feeling anxious, confusion, dizziness, increased hunger, unusually weak or tired, sweating, shakiness, cold, irritable, headache, blurred vision, fast heartbeat, loss of consciousness -trouble passing urine or change in the amount of urine -unusual stomach pain or upset -vomiting Side effects that usually do not require medical attention (Report these to your doctor or health care professional if they continue or are bothersome.): -constipation -diarrhea -fatigue -headache -nausea This list may not describe all possible side effects. Call your doctor for medical advice about side effects. You may report side effects to FDA at 1-800-FDA-1088. Where should I keep my medicine? Keep out of the reach of children. Store unopened pen in a refrigerator between 2 and 8 degrees C (36 and 46 degrees F). Do not freeze or use if the medicine has been frozen. Protect from light and excessive heat. After you first use the pen, it can be stored at room temperature between 15 and 30 degrees C (59 and 86 degrees F) or in a refrigerator. Throw away your used pen after 30 days or after the expiration date, whichever comes first. Do not store your pen with the needle attached. If the needle is left on, medicine may leak from the pen. NOTE: This sheet is a summary. It may not cover all possible information. If you have questions about this medicine, talk to your doctor, pharmacist, or health care provider.    2016, Elsevier/Gold Standard. (2013-03-09 12:29:49)

## 2015-03-22 NOTE — Progress Notes (Signed)
Subjective:    Patient ID: [name], female    DOB: 05-16-1960, 55 y.o.   MRN: [id number]  HPI    Review of Systems     Objective:   Physical Exam        Assessment & Plan:   Subjective:     [name] is a 55 y.o. female and is here for a comprehensive physical exam. The patient reports problems - see below..  2014 [facility] internal pap smear done, normal cells. She has hx of vaginal cytology coming back abnormal.   She wants to discuss weight loss. She did not have great success with phentermine in the past result wise.   Social History   Social History  . Marital Status: Legally Separated    Spouse Name: N/A  . Number of Children: N/A  . Years of Education: N/A   Occupational History  . Not on file.   Social History Main Topics  . Smoking status: Never Smoker   . Smokeless tobacco: Never Used  . Alcohol Use: No  . Drug Use: No  . Sexual Activity: Not Currently   Other Topics Concern  . Not on file   Social History Narrative   Health Maintenance  Topic Date Due  . Hepatitis C Screening  March 02, 1960  . HIV Screening  06/22/1976  . PAP SMEAR  03/21/2010  . MAMMOGRAM  06/23/2011  . COLONOSCOPY  01/29/2015  . INFLUENZA VACCINE  08/27/2015  . TETANUS/TDAP  08/26/2020    The following portions of the patient's history were reviewed and updated as appropriate: allergies, current medications, past family history, past medical history, past social history, past surgical history and problem list.  Review of Systems Pertinent items are noted in HPI.   Objective:    BP 125/52 mmHg  Pulse 68  Ht 5\' 7"  (1.702 m)  Wt 217 lb (98.431 kg)  BMI 33.98 kg/m2 General appearance: alert, cooperative and appears stated age Head: Normocephalic, without obvious abnormality, atraumatic Eyes: conjunctivae/corneas clear. PERRL, EOM's intact. Fundi benign. Ears: normal TM's and external ear canals both ears Nose: Nares normal. Septum midline. Mucosa  normal. No drainage or sinus tenderness. Throat: lips, mucosa, and tongue normal; teeth and gums normal Neck: no adenopathy, no carotid bruit, no JVD, supple, symmetrical, trachea midline and thyroid not enlarged, symmetric, no tenderness/mass/nodules Back: symmetric, no curvature. ROM normal. No CVA tenderness. Lungs: clear to auscultation bilaterally Heart: regular rate and rhythm, S1, S2 normal, no murmur, click, rub or gallop Abdomen: soft, non-tender; bowel sounds normal; no masses,  no organomegaly Extremities: extremities normal, atraumatic, no cyanosis or edema Pulses: 2+ and symmetric Skin: Skin color, texture, turgor normal. No rashes or lesions Lymph nodes: Cervical, supraclavicular, and axillary nodes normal. Neurologic: Grossly normal    Assessment:    Healthy female exam.      Plan:    CPE- will call to get pap report. Mammogram ordered. HIV and hep C ordered. Fasting labs ordered. Discussed obesity. Encouraged vitamin D 800 units and calcium 1500mg  daily. [name] going to call and schedule next colonoscopy.   Obesity- discussed options. Phentermine did not work great in past. Will retry if no benefit will consider saxenda. Concerned about coverage and cost since not tried anything else. Encouraged 1500 calorie diet and exercise 150 minutes a week. Follow up nurse visit in one month.   Sinusitis- treated with augmentin for 10 days. Discussed other symptomatic care.      See After Visit Summary for  Counseling Recommendations

## 2015-03-25 ENCOUNTER — Telehealth: Payer: Self-pay | Admitting: Physician Assistant

## 2015-03-25 ENCOUNTER — Encounter: Payer: Self-pay | Admitting: Physician Assistant

## 2015-03-25 NOTE — Telephone Encounter (Signed)
Need to call [facility] for last pap per pt 2014.

## 2015-03-27 NOTE — Telephone Encounter (Signed)
Spoke with nurse at [facility], patient started at their office in 2014, there was not a PAP done at their office.   She said there may have been one at [facility] in 2013, but that was not released to them with the other records.

## 2015-03-27 NOTE — Telephone Encounter (Signed)
Can you relay information to patient. Make her aware that she needs pap smear.

## 2015-03-28 NOTE — Telephone Encounter (Signed)
Left message for patient that [name] did not have record of her having PAP in 2014.  Explained that she needed to make an appointment for one.

## 2015-04-29 ENCOUNTER — Other Ambulatory Visit (HOSPITAL_COMMUNITY)
Admission: RE | Admit: 2015-04-29 | Discharge: 2015-04-29 | Disposition: A | Discharge status: Home / Self Care | Payer: Commercial Managed Care - HMO | Source: Ambulatory Visit | Attending: Physician Assistant | Admitting: Physician Assistant

## 2015-04-29 ENCOUNTER — Encounter: Payer: Self-pay | Admitting: Physician Assistant

## 2015-04-29 ENCOUNTER — Ambulatory Visit (INDEPENDENT_AMBULATORY_CARE_PROVIDER_SITE_OTHER): Payer: Commercial Managed Care - HMO | Admitting: Physician Assistant

## 2015-04-29 VITALS — BP 128/68 | HR 80 | Ht 67.0 in | Wt 210.0 lb

## 2015-04-29 DIAGNOSIS — E669 Obesity, unspecified: Secondary | ICD-10-CM | POA: Diagnosis not present

## 2015-04-29 DIAGNOSIS — Z01419 Encounter for gynecological examination (general) (routine) without abnormal findings: Secondary | ICD-10-CM

## 2015-04-29 DIAGNOSIS — Z6836 Body mass index (BMI) 36.0-36.9, adult: Secondary | ICD-10-CM | POA: Insufficient documentation

## 2015-04-29 DIAGNOSIS — Z6835 Body mass index (BMI) 35.0-35.9, adult: Secondary | ICD-10-CM | POA: Insufficient documentation

## 2015-04-29 DIAGNOSIS — Z01411 Encounter for gynecological examination (general) (routine) with abnormal findings: Secondary | ICD-10-CM | POA: Insufficient documentation

## 2015-04-29 DIAGNOSIS — Z1151 Encounter for screening for human papillomavirus (HPV): Secondary | ICD-10-CM | POA: Insufficient documentation

## 2015-04-29 DIAGNOSIS — E6609 Other obesity due to excess calories: Secondary | ICD-10-CM | POA: Insufficient documentation

## 2015-04-29 NOTE — Progress Notes (Signed)
  Subjective:     [name] is a 55 y.o. woman who comes in today for a  pap smear only. Her most recent annual exam was on 02/2015. Her most recent Pap smear was on over 3 years ago and showed no abnormalities. 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 Contraception: hysterectomy.   The following portions of the patient's history were reviewed and updated as appropriate: allergies, current medications, past family history, past medical history, past social history, past surgical history and problem list.  Review of Systems A comprehensive review of systems was negative.   Objective:    BP 128/68 mmHg  Pulse 80  Ht 5\' 7"  (1.702 m)  Wt 210 lb (95.255 kg)  BMI 32.88 kg/m2 Pelvic Exam: external genitalia normal, vagina normal without discharge and vaginal atropy noted.. Pap smear obtained.   Assessment:    Screening pap smear.   Plan:      Obese- could not tolerate phentermine due to increase in BP. Started healthy diet and exercise and down 7lbs. She would like to continue working on weight loss on her own.   Encouraged patient to get mammogram and schedule colonoscopy.   Pap exam: Follow up in 5 years, or as indicated by Pap results.

## 2015-04-30 ENCOUNTER — Other Ambulatory Visit: Payer: Self-pay | Admitting: Physician Assistant

## 2015-04-30 ENCOUNTER — Telehealth: Payer: Self-pay | Admitting: *Deleted

## 2015-04-30 LAB — COMPLETE METABOLIC PANEL WITH GFR
ALK PHOS: 91 U/L (ref 33–130)
ALT: 9 U/L (ref 6–29)
AST: 22 U/L (ref 10–35)
Albumin: 4.2 g/dL (ref 3.6–5.1)
BILIRUBIN TOTAL: 0.4 mg/dL (ref 0.2–1.2)
BUN: 9 mg/dL (ref 7–25)
CO2: 25 mmol/L (ref 20–31)
Calcium: 9.4 mg/dL (ref 8.6–10.4)
Chloride: 106 mmol/L (ref 98–110)
Creat: 0.73 mg/dL (ref 0.50–1.05)
GFR, Est Non African American: >89 mL/min (ref >=60)
Glucose, Bld: 86 mg/dL (ref 65–99)
POTASSIUM: 4.3 mmol/L (ref 3.5–5.3)
Sodium: 140 mmol/L (ref 135–146)
TOTAL PROTEIN: 6.8 g/dL (ref 6.1–8.1)

## 2015-04-30 LAB — VITAMIN D 25 HYDROXY (VIT D DEFICIENCY, FRACTURES): Vit D, 25-Hydroxy: 18 ng/mL — ABNORMAL LOW (ref 30–100)

## 2015-04-30 LAB — LIPID PANEL
CHOL/HDL RATIO: 3.3 ratio (ref <=5.0)
Cholesterol: 240 mg/dL — ABNORMAL HIGH (ref 125–200)
HDL: 72 mg/dL (ref >=46)
LDL CALC: 143 mg/dL — AB (ref <130)
TRIGLYCERIDES: 124 mg/dL (ref <150)
VLDL: 25 mg/dL (ref <30)

## 2015-04-30 LAB — HEPATITIS C ANTIBODY: HCV AB: NEGATIVE

## 2015-04-30 LAB — VITAMIN B12: VITAMIN B 12: 336 pg/mL (ref 200–1100)

## 2015-04-30 LAB — HIV ANTIBODY (ROUTINE TESTING W REFLEX): HIV 1&2 Ab, 4th Generation: NONREACTIVE

## 2015-04-30 LAB — TSH: TSH: 3.65 m[IU]/L

## 2015-04-30 NOTE — Telephone Encounter (Signed)
TSH upper limit normal. Would she like to try increase in levothyoxine?

## 2015-04-30 NOTE — Telephone Encounter (Signed)
Pt requests a new rx for levothyroxine sent to pharm. Have to route to provider since we have not yet prescribed this medication. Patient would like this sent to pharm in [place]. 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

## 2015-05-01 LAB — CYTOLOGY - PAP

## 2015-05-01 NOTE — Telephone Encounter (Signed)
Called and left message on vm and asked to call back

## 2015-05-02 NOTE — Telephone Encounter (Signed)
Patient would like the increase of the levothyroxine. She would like it sent to the [facility] [address] in [place].

## 2015-05-03 ENCOUNTER — Other Ambulatory Visit: Payer: Self-pay | Admitting: Physician Assistant

## 2015-05-03 MED ORDER — LEVOTHYROXINE SODIUM 88 MCG PO TABS: 88.0000 ug | ORAL_TABLET | Freq: Every day | ORAL | Status: DC

## 2015-05-03 MED ORDER — VITAMIN D (ERGOCALCIFEROL) 1.25 MG (50000 UNIT) PO CAPS: 50000.0000 [IU] | ORAL_CAPSULE | ORAL | Status: DC

## 2015-05-03 NOTE — Telephone Encounter (Signed)
done

## 2015-09-02 LAB — HM COLONOSCOPY

## 2015-09-03 DIAGNOSIS — Z86010 Personal history of colonic polyps: Secondary | ICD-10-CM | POA: Insufficient documentation

## 2015-09-04 ENCOUNTER — Encounter: Payer: Self-pay | Admitting: Physician Assistant

## 2015-09-04 DIAGNOSIS — D369 Benign neoplasm, unspecified site: Secondary | ICD-10-CM | POA: Insufficient documentation

## 2015-09-10 ENCOUNTER — Encounter: Payer: Self-pay | Admitting: *Deleted

## 2015-09-10 ENCOUNTER — Emergency Department (INDEPENDENT_AMBULATORY_CARE_PROVIDER_SITE_OTHER)
Admission: EM | Admit: 2015-09-10 | Discharge: 2015-09-10 | Disposition: A | Discharge status: Home / Self Care | Payer: BLUE CROSS/BLUE SHIELD | Source: Home / Self Care | Attending: Family Medicine | Admitting: Family Medicine

## 2015-09-10 DIAGNOSIS — N12 Tubulo-interstitial nephritis, not specified as acute or chronic: Secondary | ICD-10-CM

## 2015-09-10 DIAGNOSIS — R300 Dysuria: Secondary | ICD-10-CM

## 2015-09-10 LAB — POCT URINALYSIS DIP (MANUAL ENTRY)
Bilirubin, UA: NEGATIVE
Glucose, UA: NEGATIVE
Ketones, POC UA: NEGATIVE
Nitrite, UA: NEGATIVE
PROTEIN UA: NEGATIVE
Spec Grav, UA: <=1.005 (ref 1.005–1.03)
UROBILINOGEN UA: 0.2 (ref 0–1)
pH, UA: 6.5 (ref 5–8)

## 2015-09-10 MED ORDER — FLUCONAZOLE 200 MG PO TABS: 200.0000 mg | ORAL_TABLET | Freq: Once | ORAL | 0 refills | Status: AC

## 2015-09-10 MED ORDER — CIPROFLOXACIN HCL 500 MG PO TABS: 500.0000 mg | ORAL_TABLET | Freq: Two times a day (BID) | ORAL | 0 refills | Status: DC

## 2015-09-10 NOTE — ED Provider Notes (Signed)
CSN: [id number]     Arrival date & time 09/10/15  1648 History   First MD Initiated Contact with Patient 09/10/15 1701     Chief Complaint  Patient presents with  . Urinary Frequency   (Consider location/radiation/quality/duration/timing/severity/associated sxs/prior Treatment) HPI  [name] is a 55 y.o. female presenting to UC with c/o 9 days of gradually worsening dysuria, urinary frequency and now Right sided flank pain for about 2 days. She started taking Azo last night with minimal relief. Denies fever, chills, n/v/d. Denies pain at this time. Hx of UTI several years ago. She states symptoms started shortly after having intercourse for first time in over 1 year. She reports using a condom, not concerned for STIs.  Denies vaginal symptoms.    Past Medical History:  Diagnosis Date  . Asthma    mild intermittent  . DDD (degenerative disc disease)    c spine  . Diverticulosis   . Hemorrhoids    Dr Zettie Pho  . Hemorrhoids   . History of gastric bypass   . Thyroid disease    Past Surgical History:  Procedure Laterality Date  . APPENDECTOMY    . DILATION AND CURETTAGE OF UTERUS    . HEEL SPUR SURGERY     R/L  . HERNIA REPAIR  2008   R inguinal hernia  . lap gaastric bypass    . LTCS     X 3   . NEUROMA SURGERY     left  . TOTAL ABDOMINAL HYSTERECTOMY     w/o oophorectomy/ non cancerous   Family History  Problem Relation Age of Onset  . Hypertension Father   . Diabetes Father   . Cancer Father     melanoma   Social History  Substance Use Topics  . Smoking status: Never Smoker  . Smokeless tobacco: Never Used  . Alcohol use No   OB History    No data available     Review of Systems  Constitutional: Negative for chills and fever.  Gastrointestinal: Negative for abdominal pain, diarrhea, nausea and vomiting.  Genitourinary: Positive for dysuria, flank pain (Right), frequency, hematuria and urgency. Negative for vaginal bleeding, vaginal discharge and vaginal  pain.  Musculoskeletal: Positive for back pain. Negative for arthralgias and myalgias.  All other systems reviewed and are negative.   Allergies  Phentermine and Sulfonamide derivatives  Home Medications   Prior to Admission medications   Medication Sig Start Date End Date Taking? Authorizing Provider  Albuterol Sulfate (PROAIR RESPICLICK) 123XX123 (90 BASE) MCG/ACT AEPB Inhale 2 puffs into the lungs every 4 (four) hours. 01/01/14   Jade L Breeback, PA-C  Cholecalciferol (VITAMIN D PO) Take 5,000 Int'l Units by mouth daily.    Historical Provider, MD  ciprofloxacin (CIPRO) 500 MG tablet Take 1 tablet (500 mg total) by mouth 2 (two) times daily. One po bid x 7 days 09/10/15   Noland Fordyce, PA-C  Ferrous Sulfate (IRON) 28 MG TABS Take 365 mg by mouth daily.     Historical Provider, MD  fluconazole (DIFLUCAN) 200 MG tablet Take 1 tablet (200 mg total) by mouth once. May repeat in 3 days if still having symptoms. 09/10/15 09/10/15  Noland Fordyce, PA-C  levothyroxine (SYNTHROID, LEVOTHROID) 88 MCG tablet Take 1 tablet (88 mcg total) by mouth daily before breakfast. 05/03/15   Jade L Breeback, PA-C  sertraline (ZOLOFT) 50 MG tablet Take 50 mg by mouth daily.    Historical Provider, MD  Vitamin D, Ergocalciferol, (DRISDOL) 50000 units  CAPS capsule Take 1 capsule (50,000 Units total) by mouth every 7 (seven) days. 05/03/15   Jade L Breeback, PA-C  zolpidem (AMBIEN) 5 MG tablet Take 5 mg by mouth at bedtime as needed for sleep.    Historical Provider, MD   Meds Ordered and Administered this Visit  Medications - No data to display  BP 126/82 (BP Location: Left Arm)   Pulse 73   Temp 97.9 F (36.6 C) (Oral)   Resp 16   Ht 5\' 7"  (1.702 m)   Wt 200 lb (90.7 kg)   SpO2 100%   BMI 31.32 kg/m  No data found.   Physical Exam  Constitutional: She is oriented to person, place, and time. She appears well-developed and well-nourished. No distress.  Pt sitting on exam chair, NAD. Non-toxic appearing.   HENT:   Head: Normocephalic and atraumatic.  Mouth/Throat: Oropharynx is clear and moist.  Eyes: EOM are normal.  Neck: Normal range of motion.  Cardiovascular: Normal rate and regular rhythm.   Pulmonary/Chest: Effort normal. No respiratory distress. She has no wheezes.  Abdominal: Soft. She exhibits no distension and no mass. There is no tenderness. There is CVA tenderness (Right side). There is no rebound and no guarding.  Musculoskeletal: Normal range of motion.  Neurological: She is alert and oriented to person, place, and time.  Skin: Skin is warm and dry. She is not diaphoretic.  Psychiatric: She has a normal mood and affect. Her behavior is normal.  Nursing note and vitals reviewed.   Urgent Care Course   Clinical Course    Procedures (including critical care time)  Labs Review Labs Reviewed  POCT URINALYSIS DIP (MANUAL ENTRY) - Abnormal; Notable for the following:       Result Value   Clarity, UA cloudy (*)    Blood, UA moderate (*)    Leukocytes, UA large (3+) (*)    All other components within normal limits  URINE CULTURE    Imaging Review No results found.    MDM   1. Dysuria   2. Pyelonephritis    Pt c/o urinary symptoms for 9 days, radiating into Right flank. Hx of UTI. Hx of recent intercourse, which may have triggered current UTI. Denies concern for STIs.  [name] non-toxic appearing. Moist mucous membranes. UA c/w UTI, will send culture.  Rx: Ciprofloxacin (has had before w/o side effects) Encouraged good hydration.  F/u with PCP in 4-5 days if not improving, sooner if worsening. Patient verbalized understanding and agreement with treatment plan.    Noland Fordyce, PA-C 09/10/15 1731

## 2015-09-10 NOTE — ED Triage Notes (Signed)
Pt c/o dysuria and urinary frequency x 9 days, worse x 2 days. Denies fever.

## 2015-09-13 ENCOUNTER — Telehealth: Payer: Self-pay | Admitting: *Deleted

## 2015-09-13 LAB — URINE CULTURE

## 2015-09-13 NOTE — Telephone Encounter (Signed)
Callback: Pt advised of UCX results, she is much improved. Encouraged to complete course.

## 2015-09-25 ENCOUNTER — Encounter: Payer: Self-pay | Admitting: Physician Assistant

## 2015-10-07 ENCOUNTER — Ambulatory Visit (INDEPENDENT_AMBULATORY_CARE_PROVIDER_SITE_OTHER): Payer: BLUE CROSS/BLUE SHIELD | Admitting: Physician Assistant

## 2015-10-07 ENCOUNTER — Encounter: Payer: Self-pay | Admitting: Physician Assistant

## 2015-10-07 VITALS — BP 111/65 | HR 69 | Temp 97.8°F | Ht 67.0 in | Wt 203.0 lb

## 2015-10-07 DIAGNOSIS — E559 Vitamin D deficiency, unspecified: Secondary | ICD-10-CM | POA: Diagnosis not present

## 2015-10-07 DIAGNOSIS — R221 Localized swelling, mass and lump, neck: Secondary | ICD-10-CM

## 2015-10-07 DIAGNOSIS — E669 Obesity, unspecified: Secondary | ICD-10-CM | POA: Diagnosis not present

## 2015-10-07 MED ORDER — LIRAGLUTIDE -WEIGHT MANAGEMENT 18 MG/3ML ~~LOC~~ SOPN
0.6000 mg | PEN_INJECTOR | Freq: Every day | SUBCUTANEOUS | 1 refills | Status: DC
Start: 2015-10-07 — End: 2016-03-09

## 2015-10-07 NOTE — Patient Instructions (Signed)
saxenda follow weight in 6-8 weeks.

## 2015-10-07 NOTE — Progress Notes (Addendum)
   Subjective:    Patient ID: [name], female    DOB: [birth year], 55 y.o.   MRN: [id number]  HPI [name] a 55 yo female who presents to the clinic with uvula swelling. She feels like her uvula swells off and on. Benadryl helps some. Not aware of trigger. Denies fever, chills, ST, ear pain. She feels at times she is going to choke because it is big. Cold drinks make it feel better. Seems to have some post nasal drip.   [name] obese and trying to lose weight. Down 15lbs over past year. She is exercising. Tried weight loss meds but had adverse effects or did not work. She would like help with this. She had gastric bypass years ago.   Review of Systems  All other systems reviewed and are negative.      Objective:   Physical Exam  Constitutional: She is oriented to person, place, and time. She appears well-developed and well-nourished.  Obese.   HENT:  Head: Normocephalic and atraumatic.  Right Ear: External ear normal.  Left Ear: External ear normal.  Nose: Nose normal.  Mouth/Throat: Oropharynx is clear and moist. No oropharyngeal exudate.  Midline Erythematous uvula with mild edema. No exudate or tonsilar swelling/enlargement.   Eyes: Conjunctivae are normal. Right eye exhibits no discharge. Left eye exhibits no discharge.  Neck: Normal range of motion. Neck supple. No thyromegaly present.  Pulmonary/Chest: Effort normal and breath sounds normal.  Lymphadenopathy:    She has no cervical adenopathy.  Neurological: She is alert and oriented to person, place, and time.  Psychiatric: She has a normal mood and affect. Her behavior is normal.          Assessment & Plan:  Swollen uvula- unclear etiology unless it is some sensitivity to food or drink or allergies. Encouraged zyrtec at bedtime. Benadryl as needed if worsens let me know. Reassurance given today.   Obese- discussed options. Tried and failed prozac, phentermine,qsymia and hx of gastric bypass. Doing well with diet and  exercise on her own. Pt was counseled to increase physical activity to increase exercise to at least 176minutes per wee and decrease calories to 1500 daily. Would like to try saxenda. Discussed side effects and how to use. Start  .6mg  dose and increase weekly by .6mg  until gets to 3mg .    Vitamin D- will recheck today.

## 2015-10-08 DIAGNOSIS — R221 Localized swelling, mass and lump, neck: Secondary | ICD-10-CM | POA: Insufficient documentation

## 2015-10-08 LAB — VITAMIN D 25 HYDROXY (VIT D DEFICIENCY, FRACTURES): Vit D, 25-Hydroxy: 31 ng/mL (ref 30–100)

## 2015-10-09 ENCOUNTER — Telehealth: Payer: Self-pay | Admitting: *Deleted

## 2015-10-09 NOTE — Telephone Encounter (Signed)
Prior auth sent through covermymeds Key: [id number]

## 2015-10-15 NOTE — Telephone Encounter (Signed)
saxenda denied. Letter placed in providers box

## 2015-11-03 ENCOUNTER — Other Ambulatory Visit: Payer: Self-pay | Admitting: Physician Assistant

## 2015-12-16 ENCOUNTER — Other Ambulatory Visit: Payer: Self-pay | Admitting: Physician Assistant

## 2015-12-17 ENCOUNTER — Other Ambulatory Visit: Payer: Self-pay | Admitting: *Deleted

## 2015-12-17 MED ORDER — SERTRALINE HCL 50 MG PO TABS: 50.0000 mg | ORAL_TABLET | Freq: Every day | ORAL | 0 refills | Status: DC

## 2016-01-13 ENCOUNTER — Other Ambulatory Visit: Payer: Self-pay | Admitting: *Deleted

## 2016-01-13 MED ORDER — VITAMIN D (ERGOCALCIFEROL) 1.25 MG (50000 UNIT) PO CAPS: 50000.0000 [IU] | ORAL_CAPSULE | ORAL | 2 refills | Status: DC

## 2016-01-27 ENCOUNTER — Encounter: Payer: Self-pay | Admitting: *Deleted

## 2016-01-27 ENCOUNTER — Emergency Department (INDEPENDENT_AMBULATORY_CARE_PROVIDER_SITE_OTHER)
Admission: EM | Admit: 2016-01-27 | Discharge: 2016-01-27 | Disposition: A | Discharge status: Home / Self Care | Payer: BLUE CROSS/BLUE SHIELD | Source: Home / Self Care | Attending: Family Medicine | Admitting: Family Medicine

## 2016-01-27 DIAGNOSIS — J069 Acute upper respiratory infection, unspecified: Secondary | ICD-10-CM

## 2016-01-27 DIAGNOSIS — B9789 Other viral agents as the cause of diseases classified elsewhere: Secondary | ICD-10-CM | POA: Diagnosis not present

## 2016-01-27 MED ORDER — BENZONATATE 200 MG PO CAPS: ORAL_CAPSULE | ORAL | 0 refills | Status: DC

## 2016-01-27 MED ORDER — PREDNISONE 20 MG PO TABS: ORAL_TABLET | ORAL | 0 refills | Status: DC

## 2016-01-27 MED ORDER — AZITHROMYCIN 250 MG PO TABS: ORAL_TABLET | ORAL | 0 refills | Status: DC

## 2016-01-27 NOTE — ED Provider Notes (Signed)
[facility]    CSN: [id number] Arrival date & time: 01/27/16  0847     History   Chief Complaint Chief Complaint  Patient presents with  . Cough  . Nasal Congestion    HPI [name] is a 56 y.o. female.   Patient had developed a cold about two weeks ago that improved.  Three days ago she began to feel worse with increased sinus congestion, fatigue, non-productive cough, headache, and shortness of breath with activity.  She has a history of seasonal rhinitis and asthma.   The history is provided by the patient.    Past Medical History:  Diagnosis Date  . Asthma    mild intermittent  . DDD (degenerative disc disease)    c spine  . Diverticulosis   . Hemorrhoids    Dr Zettie Pho  . Hemorrhoids   . History of gastric bypass   . Thyroid disease     Patient Active Problem List   Diagnosis Date Noted  . Swollen uvula 10/08/2015  . Adenomatous polyps 09/04/2015  . Obese 04/29/2015  . Asthma, mild intermittent 01/01/2014  . Depression 01/01/2014  . Thyroid activity decreased 01/01/2014  . Vitamin D deficiency 01/01/2014  . VULVAR CANDIDIASIS 10/28/2010  . EUSTACHIAN TUBE DYSFUNCTION, RIGHT 08/31/2010  . TINNITUS, RIGHT 08/31/2010  . Thyromegaly 06/26/2010  . Fatigue 06/26/2010  . Migraines 06/26/2010  . Right upper quadrant pain 04/17/2010  . ASTHMA, WITH ACUTE EXACERBATION 02/05/2010  . KNEE PAIN, RIGHT, ACUTE 09/26/2009  . ARTHRITIS, ACROMIOCLAVICULAR 05/08/2009  . [place] DISEASE, CERVICAL 05/08/2009  . THYROID NODULE 02/04/2009  . B12 DEFICIENCY 11/02/2007  . EXTERNAL HEMORRHOIDS WITHOUT MENTION COMP 10/24/2007  . CERVICAL LYMPHADENOPATHY 10/24/2007  . BARIATRIC SURGERY STATUS 03/16/2007  . Goiter, unspecified 12/01/2006  . HERNIA, UNILATERAL INGUINAL, W/O OBST/GNGR 05/06/2006  . ALLERGIC RHINITIS 04/06/2006  . SHOULDER PAIN 02/04/2006  . HYPERCHOLESTEROLEMIA 11/03/2005  . DEPRESSION, MAJOR, RECURRENT 11/03/2005  . VARICOSE VEINS 11/03/2005    . Diverticulitis of colon (without mention of hemorrhage)(562.11) 11/03/2005    Past Surgical History:  Procedure Laterality Date  . APPENDECTOMY    . DILATION AND CURETTAGE OF UTERUS    . HEEL SPUR SURGERY     R/L  . HERNIA REPAIR  2008   R inguinal hernia  . lap gaastric bypass    . LTCS     X 3   . NEUROMA SURGERY     left  . TOTAL ABDOMINAL HYSTERECTOMY     w/o oophorectomy/ non cancerous    OB History    No data available       Home Medications    Prior to Admission medications   Medication Sig Start Date End Date Taking? Authorizing Provider  Albuterol Sulfate (PROAIR RESPICLICK) 123XX123 (90 BASE) MCG/ACT AEPB Inhale 2 puffs into the lungs every 4 (four) hours. 01/01/14   Jade L Breeback, PA-C  azithromycin (ZITHROMAX Z-PAK) 250 MG tablet Take 2 tabs today; then begin one tab once daily for 4 more days. 01/27/16   Kandra Nicolas, MD  benzonatate (TESSALON) 200 MG capsule Take one cap by mouth at bedtime as needed for cough.  May repeat in 4 to 6 hours 01/27/16   Kandra Nicolas, MD  Cholecalciferol (VITAMIN D PO) Take 5,000 Int'l Units by mouth daily.    Historical Provider, MD  Ferrous Sulfate (IRON) 28 MG TABS Take 365 mg by mouth daily.     Historical Provider, MD  levothyroxine (SYNTHROID, LEVOTHROID) 88 MCG  tablet TAKE 1 TABLET BY MOUTH DAILY BEFORE BREAKFAST. 11/04/15   Jade L Breeback, PA-C  Liraglutide -Weight Management (SAXENDA) 18 MG/3ML SOPN Inject 0.6 mg into the skin daily. For one week then increase by .6mg  weekly until reaches 3mg  daily.  Please include ultra fine needles 42mm 10/07/15   Jade L Breeback, PA-C  predniSONE (DELTASONE) 20 MG tablet Take one tab by mouth twice daily for 5 days, then one daily for 3 days. Take with food. 01/27/16   Kandra Nicolas, MD  sertraline (ZOLOFT) 50 MG tablet Take 1 tablet (50 mg total) by mouth daily. Needs follow up appointment for future refills. 12/17/15   Jade L Breeback, PA-C  Vitamin D, Ergocalciferol, (DRISDOL) 50000  units CAPS capsule Take 1 capsule (50,000 Units total) by mouth every 7 (seven) days. 01/13/16   Jade L Breeback, PA-C  zolpidem (AMBIEN) 5 MG tablet Take 5 mg by mouth at bedtime as needed for sleep.    Historical Provider, MD    Family History Family History  Problem Relation Age of Onset  . Hypertension Father   . Diabetes Father   . Cancer Father     melanoma    Social History Social History  Substance Use Topics  . Smoking status: Never Smoker  . Smokeless tobacco: Never Used  . Alcohol use No     Allergies   Phentermine and Sulfonamide derivatives   Review of Systems Review of Systems No sore throat + cough No pleuritic pain No wheezing + nasal congestion + post-nasal drainage + sinus pain/pressure No itchy/red eyes No earache No hemoptysis + SOB No fever/chills No nausea No vomiting No abdominal pain No diarrhea No urinary symptoms No skin rash + fatigue No myalgias + headache Used OTC meds without relief   Physical Exam Triage Vital Signs ED Triage Vitals  Enc Vitals Group     BP 01/27/16 0901 113/77     Pulse Rate 01/27/16 0901 81     Resp 01/27/16 0901 18     Temp 01/27/16 0901 98.3 F (36.8 C)     Temp Source 01/27/16 0901 Oral     SpO2 01/27/16 0901 98 %     Weight 01/27/16 0901 203 lb (92.1 kg)     Height 01/27/16 0901 5\' 7"  (1.702 m)     Head Circumference --      Peak Flow --      Pain Score 01/27/16 0902 0     Pain Loc --      Pain Edu? --      Excl. in [place]? --    No data found.   Updated Vital Signs BP 113/77 (BP Location: Left Arm)   Pulse 81   Temp 98.3 F (36.8 C) (Oral)   Resp 18   Ht 5\' 7"  (1.702 m)   Wt 203 lb (92.1 kg)   SpO2 98%   BMI 31.79 kg/m   Visual Acuity Right Eye Distance:   Left Eye Distance:   Bilateral Distance:    Right Eye Near:   Left Eye Near:    Bilateral Near:     Physical Exam Nursing notes and Vital Signs reviewed. Appearance:  Patient appears stated age, and in no acute  distress Eyes:  Pupils are equal, round, and reactive to light and accomodation.  Extraocular movement is intact.  Conjunctivae are not inflamed  Ears:  Canals normal.  Tympanic membranes normal.  Nose:  Mildly congested turbinates.  No sinus tenderness.   Pharynx:  Normal Neck:  Supple.  Tender enlarged posterior/lateral nodes are palpated bilaterally  Lungs:  Clear to auscultation.  Breath sounds are equal.  Moving air well. Chest:  Distinct tenderness to palpation over the mid-sternum.  Heart:  Regular rate and rhythm without murmurs, rubs, or gallops.  Abdomen:  Nontender without masses or hepatosplenomegaly.  Bowel sounds are present.  No CVA or flank tenderness.  Extremities:  No edema.  Skin:  No rash present.    UC Treatments / Results  Labs (all labs ordered are listed, but only abnormal results are displayed) Labs Reviewed - No data to display  EKG  EKG Interpretation None       Radiology No results found.  Procedures Procedures (including critical care time)  Medications Ordered in UC Medications - No data to display   Initial Impression / Assessment and Plan / UC Course  I have reviewed the triage vital signs and the nursing notes.  Pertinent labs & imaging results that were available during my care of the patient were reviewed by me and considered in my medical decision making (see chart for details).  Clinical Course   Patient appears to have developed a second viral URI within a two week period. Begin Z-pak for atypical coverage.  With her history of seasonal rhinitis and asthma, will begin prednisone burst/taper. Prescription written for Benzonatate [facility]) to take at bedtime for night-time cough.  Take plain guaifenesin (1200mg  extended release tabs such as Mucinex) twice daily, with plenty of water, for cough and congestion.  May add Pseudoephedrine (30mg , one or two every 4 to 6 hours) for sinus congestion.  Get adequate rest.   May use Afrin nasal  spray (or generic oxymetazoline) twice daily for about 5 days and then discontinue.  Also recommend using saline nasal spray several times daily and saline nasal irrigation (AYR is a common brand).  Use Flonase nasal spray each morning after using Afrin nasal spray and saline nasal irrigation. Try warm salt water gargles for sore throat.  Stop all antihistamines for now, and other non-prescription cough/cold preparations.   Follow-up with family doctor if not improving about10 days.   Final Clinical Impressions(s) / UC Diagnoses   Final diagnoses:  Viral URI with cough    New Prescriptions New Prescriptions   AZITHROMYCIN (ZITHROMAX Z-PAK) 250 MG TABLET    Take 2 tabs today; then begin one tab once daily for 4 more days.   BENZONATATE (TESSALON) 200 MG CAPSULE    Take one cap by mouth at bedtime as needed for cough.  May repeat in 4 to 6 hours   PREDNISONE (DELTASONE) 20 MG TABLET    Take one tab by mouth twice daily for 5 days, then one daily for 3 days. Take with food.     Kandra Nicolas, MD 02/09/16 [phone]

## 2016-01-27 NOTE — Discharge Instructions (Signed)
Take plain guaifenesin (1200mg  extended release tabs such as Mucinex) twice daily, with plenty of water, for cough and congestion.  May add Pseudoephedrine (30mg , one or two every 4 to 6 hours) for sinus congestion.  Get adequate rest.   May use Afrin nasal spray (or generic oxymetazoline) twice daily for about 5 days and then discontinue.  Also recommend using saline nasal spray several times daily and saline nasal irrigation (AYR is a common brand).  Use Flonase nasal spray each morning after using Afrin nasal spray and saline nasal irrigation. Try warm salt water gargles for sore throat.  Stop all antihistamines for now, and other non-prescription cough/cold preparations.   Follow-up with family doctor if not improving about10 days.

## 2016-01-27 NOTE — ED Triage Notes (Signed)
Pt c/o nasal congestion and nonproductive cough x 2 wks. Denies fever.

## 2016-03-03 ENCOUNTER — Other Ambulatory Visit: Payer: Self-pay | Admitting: Physician Assistant

## 2016-03-09 ENCOUNTER — Ambulatory Visit (INDEPENDENT_AMBULATORY_CARE_PROVIDER_SITE_OTHER): Payer: BLUE CROSS/BLUE SHIELD | Admitting: Physician Assistant

## 2016-03-09 ENCOUNTER — Encounter: Payer: Self-pay | Admitting: Physician Assistant

## 2016-03-09 VITALS — BP 105/68 | HR 61 | Temp 97.9°F | Resp 16 | Ht 67.0 in | Wt 206.0 lb

## 2016-03-09 DIAGNOSIS — E6609 Other obesity due to excess calories: Secondary | ICD-10-CM

## 2016-03-09 DIAGNOSIS — R110 Nausea: Secondary | ICD-10-CM | POA: Diagnosis not present

## 2016-03-09 DIAGNOSIS — E039 Hypothyroidism, unspecified: Secondary | ICD-10-CM | POA: Diagnosis not present

## 2016-03-09 DIAGNOSIS — Z6832 Body mass index (BMI) 32.0-32.9, adult: Secondary | ICD-10-CM | POA: Diagnosis not present

## 2016-03-09 MED ORDER — ONDANSETRON HCL 4 MG PO TABS: 4.0000 mg | ORAL_TABLET | Freq: Three times a day (TID) | ORAL | 0 refills | Status: DC | PRN

## 2016-03-09 MED ORDER — LEVOTHYROXINE SODIUM 88 MCG PO TABS: ORAL_TABLET | ORAL | 0 refills | Status: DC

## 2016-03-09 MED ORDER — LORCASERIN HCL 10 MG PO TABS: ORAL_TABLET | ORAL | 2 refills | Status: DC

## 2016-03-09 NOTE — Progress Notes (Signed)
Patient here because insurance will not cover ordered Saxenda until she tries belviq. Did have influenza vaccination at work.

## 2016-03-09 NOTE — Progress Notes (Signed)
   Subjective:    Patient ID: [name], female    DOB: [birth year], 56 y.o.   MRN: [id number]  HPI [name] a 56 yo female who presents to the clinic to discuss weight loss. Her insurance would not approve saxenda without first trying belviq. She would like to start belviq today. She admits to not exercising but she has tried to make diet changes.   Needs hypothyroidism medication refilled. Last TSH 10 months ago and normal range.   Review of Systems  All other systems reviewed and are negative.      Objective:   Physical Exam  Constitutional: She is oriented to person, place, and time. She appears well-developed and well-nourished.  HENT:  Head: Normocephalic and atraumatic.  Cardiovascular: Normal rate, regular rhythm and normal heart sounds.   Pulmonary/Chest: Effort normal and breath sounds normal.  Neurological: She is alert and oriented to person, place, and time.  Psychiatric: She has a normal mood and affect. Her behavior is normal.          Assessment & Plan:  Marland KitchenMarland KitchenXiamara was seen today for weight loss.  Diagnoses and all orders for this visit:  Class 1 obesity due to excess calories without serious comorbidity with body mass index (BMI) of 32.0 to 32.9 in adult -     Lorcaserin HCl (BELVIQ) 10 MG TABS; Take one tablet twice a day.  Nausea -     ondansetron (ZOFRAN) 4 MG tablet; Take 1 tablet (4 mg total) by mouth every 8 (eight) hours as needed for nausea or vomiting.  Hypothyroidism, unspecified type -     levothyroxine (SYNTHROID, LEVOTHROID) 88 MCG tablet; TAKE 1 TABLET BY MOUTH DAILY BEFORE BREAKFAST.   Discussed belviq given coupon card. Discussed side effects. zofran given for any nausea since patient is prone to nausea. If nausea persist will need to discontinue medication. Discussed 1200 calorie diet and 150 minutes of exercise a week. Follow up in 3 months.   Will recheck TSH in 3 months. Refilled today.

## 2016-03-17 ENCOUNTER — Other Ambulatory Visit: Payer: Self-pay | Admitting: *Deleted

## 2016-03-17 ENCOUNTER — Telehealth: Payer: Self-pay | Admitting: *Deleted

## 2016-03-17 MED ORDER — VALACYCLOVIR HCL 1 G PO TABS: 1000.0000 mg | ORAL_TABLET | Freq: Two times a day (BID) | ORAL | 0 refills | Status: DC

## 2016-03-17 NOTE — Telephone Encounter (Signed)
Pre Authorization sent to cover my meds.  [id number]

## 2016-03-17 NOTE — Telephone Encounter (Signed)
belviq approved through insurance. Effective dates are 03/17/2016-09/12/2016.patient and pharm notified

## 2016-03-25 ENCOUNTER — Ambulatory Visit (INDEPENDENT_AMBULATORY_CARE_PROVIDER_SITE_OTHER): Payer: BLUE CROSS/BLUE SHIELD | Admitting: Physician Assistant

## 2016-03-25 ENCOUNTER — Ambulatory Visit (INDEPENDENT_AMBULATORY_CARE_PROVIDER_SITE_OTHER): Payer: BLUE CROSS/BLUE SHIELD

## 2016-03-25 ENCOUNTER — Encounter: Payer: Self-pay | Admitting: Physician Assistant

## 2016-03-25 VITALS — BP 112/70 | HR 78 | Ht 67.0 in | Wt 206.0 lb

## 2016-03-25 DIAGNOSIS — M25561 Pain in right knee: Secondary | ICD-10-CM

## 2016-03-25 DIAGNOSIS — G8929 Other chronic pain: Secondary | ICD-10-CM | POA: Diagnosis not present

## 2016-03-25 DIAGNOSIS — M170 Bilateral primary osteoarthritis of knee: Secondary | ICD-10-CM

## 2016-03-25 DIAGNOSIS — M25562 Pain in left knee: Secondary | ICD-10-CM

## 2016-03-25 MED ORDER — MELOXICAM 15 MG PO TABS: 15.0000 mg | ORAL_TABLET | Freq: Every day | ORAL | 2 refills | Status: DC

## 2016-03-25 NOTE — Patient Instructions (Addendum)
Knee Exercises Ask your health care provider which exercises are safe for you. Do exercises exactly as told by your health care provider and adjust them as directed. It is normal to feel mild stretching, pulling, tightness, or discomfort as you do these exercises, but you should stop right away if you feel sudden pain or your pain gets worse.Do not begin these exercises until told by your health care provider. STRETCHING AND RANGE OF MOTION EXERCISES  These exercises warm up your muscles and joints and improve the movement and flexibility of your knee. These exercises also help to relieve pain, numbness, and tingling. Exercise A: Knee Extension, Prone  1. Lie on your abdomen on a bed. 2. Place your left / right knee just beyond the edge of the surface so your knee is not on the bed. You can put a towel under your left / right thigh just above your knee for comfort. 3. Relax your leg muscles and allow gravity to straighten your knee. You should feel a stretch behind your left / right knee. 4. Hold this position for __________ seconds. 5. Scoot up so your knee is supported between repetitions. Repeat __________ times. Complete this stretch __________ times a day. Exercise B: Knee Flexion, Active   1. Lie on your back with both knees straight. If this causes back discomfort, bend your left / right knee so your foot is flat on the floor. 2. Slowly slide your left / right heel back toward your buttocks until you feel a gentle stretch in the front of your knee or thigh. 3. Hold this position for __________ seconds. 4. Slowly slide your left / right heel back to the starting position. Repeat __________ times. Complete this exercise __________ times a day. Exercise C: Quadriceps, Prone   1. Lie on your abdomen on a firm surface, such as a bed or padded floor. 2. Bend your left / right knee and hold your ankle. If you cannot reach your ankle or pant leg, loop a belt around your foot and grab the belt  instead. 3. Gently pull your heel toward your buttocks. Your knee should not slide out to the side. You should feel a stretch in the front of your thigh and knee. 4. Hold this position for __________ seconds. Repeat __________ times. Complete this stretch __________ times a day. Exercise D: Hamstring, Supine  1. Lie on your back. 2. Loop a belt or towel over the ball of your left / right foot. The ball of your foot is on the walking surface, right under your toes. 3. Straighten your left / right knee and slowly pull on the belt to raise your leg until you feel a gentle stretch behind your knee.  Do not let your left / right knee bend while you do this.  Keep your other leg flat on the floor. 4. Hold this position for __________ seconds. Repeat __________ times. Complete this stretch __________ times a day. STRENGTHENING EXERCISES  These exercises build strength and endurance in your knee. Endurance is the ability to use your muscles for a long time, even after they get tired. Exercise E: Quadriceps, Isometric   1. Lie on your back with your left / right leg extended and your other knee bent. Put a rolled towel or small pillow under your knee if told by your health care provider. 2. Slowly tense the muscles in the front of your left / right thigh. You should see your kneecap slide up toward your hip or see  increased dimpling just above the knee. This motion will push the back of the knee toward the floor. 3. For __________ seconds, keep the muscle as tight as you can without increasing your pain. 4. Relax the muscles slowly and completely. Repeat __________ times. Complete this exercise __________ times a day. Exercise F: Straight Leg Raises - Quadriceps  1. Lie on your back with your left / right leg extended and your other knee bent. 2. Tense the muscles in the front of your left / right thigh. You should see your kneecap slide up or see increased dimpling just above the knee. Your thigh  may even shake a bit. 3. Keep these muscles tight as you raise your leg 4-6 inches (10-15 cm) off the floor. Do not let your knee bend. 4. Hold this position for __________ seconds. 5. Keep these muscles tense as you lower your leg. 6. Relax your muscles slowly and completely after each repetition. Repeat __________ times. Complete this exercise __________ times a day. Exercise G: Hamstring, Isometric  1. Lie on your back on a firm surface. 2. Bend your left / right knee approximately __________ degrees. 3. Dig your left / right heel into the surface as if you are trying to pull it toward your buttocks. Tighten the muscles in the back of your thighs to dig as hard as you can without increasing any pain. 4. Hold this position for __________ seconds. 5. Release the tension gradually and allow your muscles to relax completely for __________ seconds after each repetition. Repeat __________ times. Complete this exercise __________ times a day. Exercise H: Hamstring Curls   If told by your health care provider, do this exercise while wearing ankle weights. Begin with __________ weights. Then increase the weight by 1 lb (0.5 kg) increments. Do not wear ankle weights that are more than __________. 1. Lie on your abdomen with your legs straight. 2. Bend your left / right knee as far as you can without feeling pain. Keep your hips flat against the floor. 3. Hold this position for __________ seconds. 4. Slowly lower your leg to the starting position. Repeat __________ times. Complete this exercise __________ times a day. Exercise I: Squats (Quadriceps)  1. Stand in front of a table, with your feet and knees pointing straight ahead. You may rest your hands on the table for balance but not for support. 2. Slowly bend your knees and lower your hips like you are going to sit in a chair.  Keep your weight over your heels, not over your toes.  Keep your lower legs upright so they are parallel with the  table legs.  Do not let your hips go lower than your knees.  Do not bend lower than told by your health care provider.  If your knee pain increases, do not bend as low. 3. Hold the squat position for __________ seconds. 4. Slowly push with your legs to return to standing. Do not use your hands to pull yourself to standing. Repeat __________ times. Complete this exercise __________ times a day. Exercise J: Wall Slides (Quadriceps)   1. Lean your back against a smooth wall or door while you walk your feet out 18-24 inches (46-61 cm) from it. 2. Place your feet hip-width apart. 3. Slowly slide down the wall or door until your knees Repeat __________ times. Complete this exercise __________ times a day. 4. Exercise K: Straight Leg Raises - Hip Abductors  1. Lie on your side with your left / right leg   in the top position. Lie so your head, shoulder, knee, and hip line up. You may bend your bottom knee to help you keep your balance. 2. Roll your hips slightly forward so your hips are stacked directly over each other and your left / right knee is facing forward. 3. Leading with your heel, lift your top leg 4-6 inches (10-15 cm). You should feel the muscles in your outer hip lifting.  Do not let your foot drift forward.  Do not let your knee roll toward the ceiling. 4. Hold this position for __________ seconds. 5. Slowly return your leg to the starting position. 6. Let your muscles relax completely after each repetition. Repeat __________ times. Complete this exercise __________ times a day. Exercise L: Straight Leg Raises - Hip Extensors  1. Lie on your abdomen on a firm surface. You can put a pillow under your hips if that is more comfortable. 2. Tense the muscles in your buttocks and lift your left / right leg about 4-6 inches (10-15 cm). Keep your knee straight as you lift your leg. 3. Hold this position for __________ seconds. 4. Slowly lower your leg to the starting position. 5. Let  your leg relax completely after each repetition. Repeat __________ times. Complete this exercise __________ times a day. This information is not intended to replace advice given to you by your health care provider. Make sure you discuss any questions you have with your health care provider. Document Released: 11/26/2004 Document Revised: 10/07/2015 Document Reviewed: 11/18/2014 Elsevier Interactive Patient Education  2017 [employer]   mobic Glucosamine Tumeric 500mg  twice a day Knee stretches Ice

## 2016-03-25 NOTE — Progress Notes (Signed)
Mild arthritis in medial right knee.

## 2016-03-25 NOTE — Progress Notes (Signed)
Call pt: mild arthritis medial left knee.

## 2016-03-25 NOTE — Progress Notes (Signed)
   Subjective:    Patient ID: [name], female    DOB: [birth year], 56 y.o.   MRN: [id number]  HPI  [name] a 56 yo female who presents to the clinic with bilateral knee pain. She has had knee pain for at least 5 years. 4 years ago dx with chondromalcia. She did some stretches and had 3 steriod shots that helped some. She continues to have pain but seems to be worsening. Going up stairs is worse. She has problems standing for long periods of time or sitting for long periods of time. Her knees just feel weak.     Review of Systems  All other systems reviewed and are negative.      Objective:   Physical Exam  Constitutional: She is oriented to person, place, and time. She appears well-developed and well-nourished.  HENT:  Head: Normocephalic and atraumatic.  Eyes: Conjunctivae are normal.  Neck: Normal range of motion. Neck supple.  Cardiovascular: Normal rate, regular rhythm and normal heart sounds.   Pulmonary/Chest: Effort normal and breath sounds normal.  Musculoskeletal:  Full ROM of bilateral knees.  No effusion.  Tenderness over medial joint spaces and patella to palpation.  Strength of bilateral knees 5/5.  Negative lachmans/mcmurrays.   Neurological: She is alert and oriented to person, place, and time.  Psychiatric: She has a normal mood and affect. Her behavior is normal.          Assessment & Plan:  Marland KitchenMarland KitchenHesper was seen today for bilateral knee pain.  Diagnoses and all orders for this visit:  Primary osteoarthritis of both knees -     meloxicam (MOBIC) 15 MG tablet; Take 1 tablet (15 mg total) by mouth daily.  Chronic pain of both knees -     DG Knee Complete 4 Views Right; Future -     DG Knee Complete 4 Views Left; Future -     DG Knee Complete 4 Views Right -     DG Knee Complete 4 Views Left   Discussed knee strengthing exercise and given some exercises to focus on some patellarfemoral syndrome.  Start mobic.  Consider glucosamine chondrotin and/or  tumeric. Continue to lose weight. On belviq.  Ice as needed.  Sports medicine if would like to consider injections of steroid vs orthovisc.

## 2016-04-15 ENCOUNTER — Other Ambulatory Visit: Payer: Self-pay | Admitting: Physician Assistant

## 2016-04-21 ENCOUNTER — Other Ambulatory Visit: Payer: Self-pay | Admitting: *Deleted

## 2016-04-21 DIAGNOSIS — M170 Bilateral primary osteoarthritis of knee: Secondary | ICD-10-CM

## 2016-04-21 MED ORDER — MELOXICAM 15 MG PO TABS: 15.0000 mg | ORAL_TABLET | Freq: Every day | ORAL | 0 refills | Status: DC

## 2016-04-28 ENCOUNTER — Ambulatory Visit (INDEPENDENT_AMBULATORY_CARE_PROVIDER_SITE_OTHER): Payer: BLUE CROSS/BLUE SHIELD

## 2016-04-28 DIAGNOSIS — Z1231 Encounter for screening mammogram for malignant neoplasm of breast: Secondary | ICD-10-CM

## 2016-05-04 ENCOUNTER — Other Ambulatory Visit: Payer: Self-pay | Admitting: Physician Assistant

## 2016-05-18 ENCOUNTER — Ambulatory Visit (INDEPENDENT_AMBULATORY_CARE_PROVIDER_SITE_OTHER): Payer: BLUE CROSS/BLUE SHIELD | Admitting: Physician Assistant

## 2016-05-18 VITALS — BP 115/73 | HR 109 | Ht 67.0 in | Wt 206.0 lb

## 2016-05-18 DIAGNOSIS — R110 Nausea: Secondary | ICD-10-CM | POA: Diagnosis not present

## 2016-05-18 DIAGNOSIS — Z6832 Body mass index (BMI) 32.0-32.9, adult: Secondary | ICD-10-CM | POA: Diagnosis not present

## 2016-05-18 DIAGNOSIS — Z0184 Encounter for antibody response examination: Secondary | ICD-10-CM

## 2016-05-18 MED ORDER — ONDANSETRON HCL 4 MG PO TABS: 4.0000 mg | ORAL_TABLET | Freq: Three times a day (TID) | ORAL | 0 refills | Status: DC | PRN

## 2016-05-18 MED ORDER — LIRAGLUTIDE -WEIGHT MANAGEMENT 18 MG/3ML ~~LOC~~ SOPN: 0.6000 mg | PEN_INJECTOR | Freq: Every day | SUBCUTANEOUS | 1 refills | Status: DC

## 2016-05-18 NOTE — Progress Notes (Signed)
   Subjective:    Patient ID: [name], female    DOB: 10-05-1960, 56 y.o.   MRN: [id number]  HPI  Pt comes in to follow up on weight loss. She has been on belviq with no results. She has actually gained 5lbs. She has tried topamax and phentermine without results. She is really wanting to get weight off to help with knee pain. She would like to try another medication. She feels like she can't exercise because of knee pain.    Pt would like immunity status testing for varicella.    Review of Systems  All other systems reviewed and are negative.      Objective:   Physical Exam  Constitutional: She is oriented to person, place, and time. She appears well-developed and well-nourished.  HENT:  Head: Normocephalic and atraumatic.  Cardiovascular: Normal rate, regular rhythm and normal heart sounds.   Pulmonary/Chest: Effort normal and breath sounds normal.  Neurological: She is alert and oriented to person, place, and time.  Psychiatric: She has a normal mood and affect. Her behavior is normal.          Assessment & Plan:  Marland KitchenMarland KitchenDiagnoses and all orders for this visit:  BMI 32.0-32.9,adult -     Liraglutide -Weight Management (SAXENDA) 18 MG/3ML SOPN; Inject 0.6 mg into the skin daily. For one week then increase by .6mg  weekly until reaches 3mg  daily.  Please include ultra fine needles 2mm  Nausea -     ondansetron (ZOFRAN) 4 MG tablet; Take 1 tablet (4 mg total) by mouth every 8 (eight) hours as needed for nausea or vomiting.  Immunity status testing -     Varicella zoster antibody, IgG   Pt has failed belviq and gained 5lbs. She has tried phentermine and topamax in the past with no weight loss results.  Sent saxenda. Discussed side effects. zofran given for nausea. Discussed tapering up to 3mg .  Follow up in 3 months.  Discussed 1500 calorie diet and 150 minutes of exercise a week.

## 2016-05-18 NOTE — Patient Instructions (Signed)
Liraglutide injection (Weight Management) What is this medicine? LIRAGLUTIDE (LIR a GLOO tide) is used with a reduced calorie diet and exercise to help you lose weight. This medicine may be used for other purposes; ask your health care provider or pharmacist if you have questions. COMMON BRAND NAME(S): Saxenda What should I tell my health care provider before I take this medicine? They need to know if you have any of these conditions: -endocrine tumors (MEN 2) or if someone in your family had these tumors -gallbladder disease -high cholesterol -history of alcohol abuse problem -history of pancreatitis -kidney disease or if you are on dialysis -liver disease -previous swelling of the tongue, face, or lips with difficulty breathing, difficulty swallowing, hoarseness, or tightening of the throat -stomach problems -suicidal thoughts, plans, or attempt; a previous suicide attempt by you or a family member -thyroid cancer or if someone in your family had thyroid cancer -an unusual or allergic reaction to liraglutide, other medicines, foods, dyes, or preservatives -pregnant or trying to get pregnant -breast-feeding How should I use this medicine? This medicine is for injection under the skin of your upper leg, stomach area, or upper arm. You will be taught how to prepare and give this medicine. Use exactly as directed. Take your medicine at regular intervals. Do not take it more often than directed. It is important that you put your used needles and syringes in a special sharps container. Do not put them in a trash can. If you do not have a sharps container, call your pharmacist or healthcare provider to get one. A special MedGuide will be given to you by the pharmacist with each prescription and refill. Be sure to read this information carefully each time. Talk to your pediatrician regarding the use of this medicine in children. Special care may be needed. Overdosage: If you think you have taken  too much of this medicine contact a poison control center or emergency room at once. NOTE: This medicine is only for you. Do not share this medicine with others. What if I miss a dose? If you miss a dose, take it as soon as you can. If it is almost time for your next dose, take only that dose. Do not take double or extra doses. If you miss your dose for 3 days or more, call your doctor or health care professional to talk about how to restart this medicine. What may interact with this medicine? -insulin and other medicines for diabetes This list may not describe all possible interactions. Give your health care provider a list of all the medicines, herbs, non-prescription drugs, or dietary supplements you use. Also tell them if you smoke, drink alcohol, or use illegal drugs. Some items may interact with your medicine. What should I watch for while using this medicine? Visit your doctor or health care professional for regular checks on your progress. This medicine is intended to be used in addition to a healthy diet and appropriate exercise. The best results are achieved this way. Do not increase or in any way change your dose without consulting your doctor or health care professional. Drink plenty of fluids while taking this medicine. Check with your doctor or health care professional if you get an attack of severe diarrhea, nausea, and vomiting. The loss of too much body fluid can make it dangerous for you to take this medicine. This medicine may affect blood sugar levels. If you have diabetes, check with your doctor or health care professional before you change your diet  or the dose of your diabetic medicine. Patients and their families should watch out for worsening depression or thoughts of suicide. Also watch out for sudden changes in feelings such as feeling anxious, agitated, panicky, irritable, hostile, aggressive, impulsive, severely restless, overly excited and hyperactive, or not being able to  sleep. If this happens, especially at the beginning of treatment or after a change in dose, call your health care professional. What side effects may I notice from receiving this medicine? Side effects that you should report to your doctor or health care professional as soon as possible: -allergic reactions like skin rash, itching or hives, swelling of the face, lips, or tongue -breathing problems -diarrhea that continues or is severe -lump or swelling on the neck -severe nausea -signs and symptoms of infection like fever or chills; cough; sore throat; pain or trouble passing urine -signs and symptoms of low blood sugar such as feeling anxious, confusion, dizziness, increased hunger, unusually weak or tired, sweating, shakiness, cold, irritable, headache, blurred vision, fast heartbeat, loss of consciousness -signs and symptoms of kidney injury like trouble passing urine or change in the amount of urine -trouble swallowing -unusual stomach upset or pain -vomiting Side effects that usually do not require medical attention (report to your doctor or health care professional if they continue or are bothersome): -constipation -decreased appetite -diarrhea -fatigue -headache -nausea -pain, redness, or irritation at site where injected -stomach upset -stuffy or runny nose This list may not describe all possible side effects. Call your doctor for medical advice about side effects. You may report side effects to FDA at 1-800-FDA-1088. Where should I keep my medicine? Keep out of the reach of children. Store unopened pen in a refrigerator between 2 and 8 degrees C (36 and 46 degrees F). Do not freeze or use if the medicine has been frozen. Protect from light and excessive heat. After you first use the pen, it can be stored at room temperature between 15 and 30 degrees C (59 and 86 degrees F) or in a refrigerator. Throw away your used pen after 30 days or after the expiration date, whichever comes  first. Do not store your pen with the needle attached. If the needle is left on, medicine may leak from the pen. NOTE: This sheet is a summary. It may not cover all possible information. If you have questions about this medicine, talk to your doctor, pharmacist, or health care provider.  2018 Elsevier/Gold Standard (2016-01-30 14:41:37)  

## 2016-05-19 ENCOUNTER — Telehealth: Payer: Self-pay | Admitting: *Deleted

## 2016-05-19 ENCOUNTER — Encounter: Payer: Self-pay | Admitting: Physician Assistant

## 2016-05-19 DIAGNOSIS — Z6832 Body mass index (BMI) 32.0-32.9, adult: Secondary | ICD-10-CM | POA: Insufficient documentation

## 2016-05-19 LAB — VARICELLA ZOSTER ANTIBODY, IGG: Varicella IgG: 3595 Index — ABNORMAL HIGH (ref <135.00)

## 2016-05-19 NOTE — Telephone Encounter (Signed)
Pre Authorization sent to cover my meds. [id number]

## 2016-05-19 NOTE — Progress Notes (Signed)
Positive titer for immunity.

## 2016-05-21 NOTE — Telephone Encounter (Signed)
saxenda has been approved through insurance. Patient notified via vm and pharmacy notified

## 2016-06-01 ENCOUNTER — Ambulatory Visit: Payer: BLUE CROSS/BLUE SHIELD | Admitting: Physician Assistant

## 2016-06-05 ENCOUNTER — Other Ambulatory Visit: Payer: Self-pay | Admitting: Physician Assistant

## 2016-06-05 DIAGNOSIS — E039 Hypothyroidism, unspecified: Secondary | ICD-10-CM

## 2016-06-08 ENCOUNTER — Ambulatory Visit: Payer: BLUE CROSS/BLUE SHIELD | Admitting: Physician Assistant

## 2016-08-17 ENCOUNTER — Ambulatory Visit: Payer: BLUE CROSS/BLUE SHIELD | Admitting: Physician Assistant

## 2016-08-18 ENCOUNTER — Encounter: Payer: Self-pay | Admitting: Family Medicine

## 2016-08-18 ENCOUNTER — Ambulatory Visit (INDEPENDENT_AMBULATORY_CARE_PROVIDER_SITE_OTHER): Payer: BLUE CROSS/BLUE SHIELD | Admitting: Family Medicine

## 2016-08-18 VITALS — BP 121/67 | HR 68 | Ht 67.0 in | Wt 198.0 lb

## 2016-08-18 DIAGNOSIS — R5383 Other fatigue: Secondary | ICD-10-CM

## 2016-08-18 DIAGNOSIS — Z6831 Body mass index (BMI) 31.0-31.9, adult: Secondary | ICD-10-CM | POA: Diagnosis not present

## 2016-08-18 DIAGNOSIS — H9212 Otorrhea, left ear: Secondary | ICD-10-CM | POA: Diagnosis not present

## 2016-08-18 DIAGNOSIS — Z9884 Bariatric surgery status: Secondary | ICD-10-CM

## 2016-08-18 DIAGNOSIS — R635 Abnormal weight gain: Secondary | ICD-10-CM

## 2016-08-18 LAB — CBC WITH DIFFERENTIAL/PLATELET
Basophils Absolute: 60 cells/uL (ref 0–200)
Basophils Relative: 1 %
EOS ABS: 120 {cells}/uL (ref 15–500)
Eosinophils Relative: 2 %
HEMATOCRIT: 38.2 % (ref 35.0–45.0)
Hemoglobin: 12.6 g/dL (ref 11.7–15.5)
LYMPHS PCT: 33 %
Lymphs Abs: 1980 cells/uL (ref 850–3900)
MCH: 28.8 pg (ref 27.0–33.0)
MCHC: 33 g/dL (ref 32.0–36.0)
MCV: 87.4 fL (ref 80.0–100.0)
MPV: 10.8 fL (ref 7.5–12.5)
Monocytes Absolute: 420 cells/uL (ref 200–950)
Monocytes Relative: 7 %
Neutro Abs: 3420 cells/uL (ref 1500–7800)
Neutrophils Relative %: 57 %
Platelets: 294 10*3/uL (ref 140–400)
RBC: 4.37 MIL/uL (ref 3.80–5.10)
RDW: 13.8 % (ref 11.0–15.0)
WBC: 6 10*3/uL (ref 3.8–10.8)

## 2016-08-18 MED ORDER — LIRAGLUTIDE -WEIGHT MANAGEMENT 18 MG/3ML ~~LOC~~ SOPN: 1.8000 mg | PEN_INJECTOR | Freq: Every day | SUBCUTANEOUS | 1 refills | Status: DC

## 2016-08-18 MED ORDER — LIRAGLUTIDE -WEIGHT MANAGEMENT 18 MG/3ML ~~LOC~~ SOPN: 1.5000 mg | PEN_INJECTOR | Freq: Every day | SUBCUTANEOUS | 1 refills | Status: DC

## 2016-08-18 NOTE — Progress Notes (Signed)
Subjective:    CC:   HPI: Follow-up for abnormal weight gain. This is a 3 month follow-up for Sexenda. Last BMI was 32.  Previously failed Belviq, phentermine, Topamax.  She was only able to get up to 1.2-1.8 mg  Saxenda. She says the medication has really help curb her appetite. She hasn't had any significant nausea or vomiting. But says that she takes 1.8 mg for several days in a row and she just doesn't want to eat anything.  She just feels extremely fatigued. She says that time she comes home from work she is completely wiped out. She is status post gastric bypass surgery and has not been taking her multivitamin regularly.  She has had some itching and drainage from that ear. Says it is scaley at times as well. She has had some palms with like complain is on her left leg and in her mouth and wonders if it could also be affecting her ear.  Past medical history, Surgical history, Family history not pertinant except as noted below, Social history, Allergies, and medications have been entered into the medical record, reviewed, and corrections made.   Review of Systems: No fevers, chills, night sweats, weight loss, chest pain, or shortness of breath.   Objective:    General: Well Developed, well nourished, and in no acute distress.  Neuro: Alert and oriented x3, extra-ocular muscles intact, sensation grossly intact.  HEENT: Normocephalic, atraumatic, Oropharynx is clear, and the left canal there is a little bit of moisture and whiteness with a little bit of flaking of skin posteriorly. Tympanic membranes looks clear.  Skin: Warm and dry, no rashes. Cardiac: Regular rate and rhythm, no murmurs rubs or gallops, no lower extremity edema.  Respiratory: Clear to auscultation bilaterally. Not using accessory muscles, speaking in full sentences.   Impression and Recommendations:    Abnormal weight gain/BMI 31 - Overall she is doing well and has made some good progress. We can get her continuing  to work on healthy choices and regular exercise can make even more progress or at least continue this at a steady pace.  Current weight loss: 8 lbs in 3 months.  Goal Weight: 170-180 Diet: work on good food choices. She is doing well on portion control. She has cut out sweet tea.  Exercise: None. Encourage 15 min a day to start.  Current barriers: Excess Fatigue Medication: Saxenda 1.6mg  QD.   Follow-up: 2 months.     Fatigue-encouraged her take her multivitamin regularly. Will also check for nutritional deficiencies.  Left ear-did do a KOH sample to send for more fungal elements. Consider early swimmer's ear versus more of an eczema in the ear which might respond to a dermoid a coil. Will call with results once available.

## 2016-08-18 NOTE — Addendum Note (Signed)
Addended by: Darla Lesches T on: 08/18/2016 04:19 PM   Modules accepted: Orders

## 2016-08-19 LAB — COMPLETE METABOLIC PANEL WITH GFR
ALT: 6 U/L (ref 6–29)
AST: 17 U/L (ref 10–35)
Albumin: 4.3 g/dL (ref 3.6–5.1)
Alkaline Phosphatase: 83 U/L (ref 33–130)
BUN: 10 mg/dL (ref 7–25)
CO2: 25 mmol/L (ref 20–31)
Calcium: 9.4 mg/dL (ref 8.6–10.4)
Chloride: 103 mmol/L (ref 98–110)
Creat: 0.69 mg/dL (ref 0.50–1.05)
GFR, Est African American: >89 mL/min (ref >=60)
GFR, Est Non African American: >89 mL/min (ref >=60)
Glucose, Bld: 74 mg/dL (ref 65–99)
Potassium: 4.2 mmol/L (ref 3.5–5.3)
Sodium: 139 mmol/L (ref 135–146)
Total Bilirubin: 0.3 mg/dL (ref 0.2–1.2)
Total Protein: 6.9 g/dL (ref 6.1–8.1)

## 2016-08-19 LAB — FERRITIN: Ferritin: 11 ng/mL (ref 10–232)

## 2016-08-19 LAB — TSH: TSH: 0.22 mIU/L — ABNORMAL LOW

## 2016-08-19 LAB — MAGNESIUM: MAGNESIUM: 2.2 mg/dL (ref 1.5–2.5)

## 2016-08-19 LAB — VITAMIN B12: Vitamin B-12: 310 pg/mL (ref 200–1100)

## 2016-08-20 ENCOUNTER — Encounter: Payer: Self-pay | Admitting: *Deleted

## 2016-08-20 LAB — FUNGAL STAIN

## 2016-08-20 MED ORDER — NEOMYCIN-POLYMYXIN-HC 3.5-10000-1 OT SOLN: 3.0000 [drp] | Freq: Four times a day (QID) | OTIC | 0 refills | Status: DC

## 2016-08-20 NOTE — Addendum Note (Signed)
Addended by: Beatrice Lecher D on: 08/20/2016 10:07 PM   Modules accepted: Orders

## 2016-08-24 ENCOUNTER — Ambulatory Visit: Payer: BLUE CROSS/BLUE SHIELD | Admitting: Physician Assistant

## 2016-08-26 ENCOUNTER — Other Ambulatory Visit: Payer: Self-pay | Admitting: *Deleted

## 2016-08-26 MED ORDER — NEOMYCIN-POLYMYXIN-HC 3.5-10000-1 OT SOLN: 3.0000 [drp] | Freq: Four times a day (QID) | OTIC | 0 refills | Status: AC

## 2016-08-26 NOTE — Progress Notes (Signed)
Resent cortisporin ear drops to [facility] [address] in [place]

## 2016-09-09 ENCOUNTER — Telehealth: Payer: Self-pay | Admitting: *Deleted

## 2016-09-09 NOTE — Telephone Encounter (Signed)
Pre Authorization sent to cover my meds. [id number]

## 2016-09-18 NOTE — Telephone Encounter (Signed)
Pre Authorization for [name] was approved on Cover My Meds. Called pharmacy and they stated that it is not going through yet but they think it is because she just picked up 6 pens. They are going to change the date to next month.

## 2016-10-20 ENCOUNTER — Ambulatory Visit: Payer: BLUE CROSS/BLUE SHIELD | Admitting: Physician Assistant

## 2016-10-24 ENCOUNTER — Other Ambulatory Visit: Payer: Self-pay | Admitting: Physician Assistant

## 2016-10-24 DIAGNOSIS — R110 Nausea: Secondary | ICD-10-CM

## 2016-10-27 ENCOUNTER — Ambulatory Visit (INDEPENDENT_AMBULATORY_CARE_PROVIDER_SITE_OTHER): Payer: BLUE CROSS/BLUE SHIELD | Admitting: Physician Assistant

## 2016-10-27 ENCOUNTER — Encounter: Payer: Self-pay | Admitting: Physician Assistant

## 2016-10-27 VITALS — BP 118/60 | HR 74 | Ht 67.0 in | Wt 194.0 lb

## 2016-10-27 DIAGNOSIS — M62838 Other muscle spasm: Secondary | ICD-10-CM | POA: Diagnosis not present

## 2016-10-27 DIAGNOSIS — F3341 Major depressive disorder, recurrent, in partial remission: Secondary | ICD-10-CM | POA: Diagnosis not present

## 2016-10-27 DIAGNOSIS — Z6831 Body mass index (BMI) 31.0-31.9, adult: Secondary | ICD-10-CM

## 2016-10-27 MED ORDER — BUPROPION HCL ER (XL) 150 MG PO TB24: 150.0000 mg | ORAL_TABLET | Freq: Every day | ORAL | 2 refills | Status: DC

## 2016-10-27 MED ORDER — LIRAGLUTIDE -WEIGHT MANAGEMENT 18 MG/3ML ~~LOC~~ SOPN: 3.0000 mg | PEN_INJECTOR | Freq: Every day | SUBCUTANEOUS | 2 refills | Status: DC

## 2016-10-27 NOTE — Patient Instructions (Signed)
9lbs in 3 months.  Add exercise in at least 3 times a week and 10,000 a steps.

## 2016-10-27 NOTE — Progress Notes (Signed)
Subjective:    Patient ID: [name], female    DOB: 1960-12-28, 56 y.o.   MRN: [id number]  HPI  [name] a 56 yo female s/p gastric bypass who presents to the clinic to follow up on weight. She is on saxenda. She has lost a total of 24lbs but another 4lbs since July. She admits she is not exercising. She is up to the indicated 3mg  daily. Tried and failed phentermine/belviq/topamax. She is not following any diet.   Pt continues to have a lot of neck pain. No injury. No radiation of pain down arms. Having more headaches. Admits to high stress. She is feeling more down lately. Denies any SI or HC.   .. Active Ambulatory Problems    Diagnosis Date Noted  . Goiter, unspecified 12/01/2006  . THYROID NODULE 02/04/2009  . B12 DEFICIENCY 11/02/2007  . HYPERCHOLESTEROLEMIA 11/03/2005  . DEPRESSION, MAJOR, RECURRENT 11/03/2005  . VARICOSE VEINS 11/03/2005  . EXTERNAL HEMORRHOIDS WITHOUT MENTION COMP 10/24/2007  . ALLERGIC RHINITIS 04/06/2006  . HERNIA, UNILATERAL INGUINAL, W/O OBST/GNGR 05/06/2006  . Diverticulitis of colon (without mention of hemorrhage)(562.11) 11/03/2005  . ARTHRITIS, ACROMIOCLAVICULAR 05/08/2009  . SHOULDER PAIN 02/04/2006  . KNEE PAIN, RIGHT, ACUTE 09/26/2009  . [place] DISEASE, CERVICAL 05/08/2009  . CERVICAL LYMPHADENOPATHY 10/24/2007  . BARIATRIC SURGERY STATUS 03/16/2007  . ASTHMA, WITH ACUTE EXACERBATION 02/05/2010  . Right upper quadrant pain 04/17/2010  . Thyromegaly 06/26/2010  . Fatigue 06/26/2010  . Migraines 06/26/2010  . VULVAR CANDIDIASIS 10/28/2010  . TINNITUS, RIGHT 08/31/2010  . Asthma, mild intermittent 01/01/2014  . Depression 01/01/2014  . Thyroid activity decreased 01/01/2014  . Vitamin D deficiency 01/01/2014  . Obese 04/29/2015  . Adenomatous polyps 09/04/2015  . Swollen uvula 10/08/2015  . Primary osteoarthritis of both knees 03/25/2016  . BMI 32.0-32.9,adult 05/19/2016   Resolved Ambulatory Problems    Diagnosis Date Noted  . Other  chronic sinusitis 12/01/2006  . EUSTACHIAN TUBE DYSFUNCTION, RIGHT 08/31/2010  . ANAL PRURITUS 10/28/2010   Past Medical History:  Diagnosis Date  . Asthma   . DDD (degenerative disc disease)   . Diverticulosis   . Hemorrhoids   . Hemorrhoids   . History of gastric bypass   . Thyroid disease      Review of Systems  All other systems reviewed and are negative.      Objective:   Physical Exam  Constitutional: She is oriented to person, place, and time. She appears well-developed and well-nourished.  HENT:  Head: Normocephalic and atraumatic.  Neck: Normal range of motion. Neck supple. No thyromegaly present.  Cardiovascular: Normal rate, regular rhythm and normal heart sounds.   Pulmonary/Chest: Effort normal and breath sounds normal.  Musculoskeletal:  No tenderness over cspine.  Tightness of paraspinous muscles.  Normal ROM of neck.   Neurological: She is alert and oriented to person, place, and time.  Psychiatric: She has a normal mood and affect. Her behavior is normal.          Assessment & Plan:  Marland KitchenMarland KitchenDiagnoses and all orders for this visit:  BMI 31.0-31.9,adult -     Liraglutide -Weight Management (SAXENDA) 18 MG/3ML SOPN; Inject 3 mg into the skin daily. For one week then increase by .6mg  weekly until reaches 3mg  daily.  Please include ultra fine needles 28mm  Neck muscle spasm  Recurrent major depressive disorder, in partial remission (HCC) -     buPROPion (WELLBUTRIN XL) 150 MG 24 hr tablet; Take 1 tablet (150 mg total) by  mouth daily.   Marland Kitchen.Discussed low carb diet with 1500 calories and 80g of protein.  Exercising at least 150 minutes a week.  My Fitness Pal could be a Microbiologist.  Continue on saxenda.  Add exercise at least 3 times a week at least 10k steps a day.  Follow up in 3 months.   Added wellbutrin for mood. Discussed side effects. Follow up in 3 months.   Some of neck spasm could be stress. Perhaps wellbutrin could help with the stress.  Discussed symptomatic care with heat, biofreeze, tens units, NSAID at needed. If not improving consider PT referral.   ..Spent 30 minutes with patient and greater than 50 percent of visit spent counseling patient regarding treatment plan.

## 2016-10-29 ENCOUNTER — Encounter: Payer: Self-pay | Admitting: Physician Assistant

## 2016-11-21 ENCOUNTER — Other Ambulatory Visit: Payer: Self-pay | Admitting: Physician Assistant

## 2016-11-25 ENCOUNTER — Other Ambulatory Visit: Payer: Self-pay | Admitting: *Deleted

## 2016-11-25 DIAGNOSIS — F3341 Major depressive disorder, recurrent, in partial remission: Secondary | ICD-10-CM

## 2016-11-25 MED ORDER — BUPROPION HCL ER (XL) 150 MG PO TB24: 150.0000 mg | ORAL_TABLET | Freq: Every day | ORAL | 0 refills | Status: DC

## 2016-11-26 ENCOUNTER — Other Ambulatory Visit: Payer: Self-pay | Admitting: Physician Assistant

## 2017-01-18 ENCOUNTER — Other Ambulatory Visit: Payer: Self-pay | Admitting: Physician Assistant

## 2017-01-20 ENCOUNTER — Telehealth: Payer: Self-pay | Admitting: *Deleted

## 2017-01-20 NOTE — Telephone Encounter (Signed)
Pre Authorization sent to cover my meds. [id number]

## 2017-01-21 NOTE — Telephone Encounter (Signed)
Received letter stating the medication was on the list of plan's covered drg s and a PA is not required at this time.Faxed notice to pharmacy.

## 2017-02-02 ENCOUNTER — Ambulatory Visit: Payer: BLUE CROSS/BLUE SHIELD | Admitting: Physician Assistant

## 2017-02-20 ENCOUNTER — Other Ambulatory Visit: Payer: Self-pay | Admitting: Physician Assistant

## 2017-03-02 ENCOUNTER — Other Ambulatory Visit: Payer: Self-pay | Admitting: Family Medicine

## 2017-03-02 ENCOUNTER — Ambulatory Visit: Payer: BLUE CROSS/BLUE SHIELD | Admitting: Physician Assistant

## 2017-03-28 ENCOUNTER — Other Ambulatory Visit: Payer: Self-pay | Admitting: Physician Assistant

## 2017-03-28 DIAGNOSIS — F3341 Major depressive disorder, recurrent, in partial remission: Secondary | ICD-10-CM

## 2017-03-29 ENCOUNTER — Other Ambulatory Visit: Payer: Self-pay | Admitting: Physician Assistant

## 2017-04-05 ENCOUNTER — Telehealth: Payer: Self-pay | Admitting: Physician Assistant

## 2017-04-05 NOTE — Telephone Encounter (Signed)
Received a denial letter from [employer] that Kirke Shaggy was excluded from the plan. The next step is an appeal letter. I am placing the denial in your box. Please advise.

## 2017-04-06 ENCOUNTER — Ambulatory Visit (INDEPENDENT_AMBULATORY_CARE_PROVIDER_SITE_OTHER): Payer: Managed Care, Other (non HMO) | Admitting: Physician Assistant

## 2017-04-06 ENCOUNTER — Encounter: Payer: Self-pay | Admitting: Physician Assistant

## 2017-04-06 VITALS — BP 134/67 | HR 70 | Ht 67.0 in | Wt 197.0 lb

## 2017-04-06 DIAGNOSIS — J4520 Mild intermittent asthma, uncomplicated: Secondary | ICD-10-CM

## 2017-04-06 DIAGNOSIS — H60542 Acute eczematoid otitis externa, left ear: Secondary | ICD-10-CM | POA: Diagnosis not present

## 2017-04-06 DIAGNOSIS — Z6830 Body mass index (BMI) 30.0-30.9, adult: Secondary | ICD-10-CM

## 2017-04-06 DIAGNOSIS — E559 Vitamin D deficiency, unspecified: Secondary | ICD-10-CM | POA: Diagnosis not present

## 2017-04-06 DIAGNOSIS — E6609 Other obesity due to excess calories: Secondary | ICD-10-CM | POA: Diagnosis not present

## 2017-04-06 DIAGNOSIS — F330 Major depressive disorder, recurrent, mild: Secondary | ICD-10-CM

## 2017-04-06 DIAGNOSIS — E039 Hypothyroidism, unspecified: Secondary | ICD-10-CM

## 2017-04-06 MED ORDER — HYDROCORTISONE-ACETIC ACID 1-2 % OT SOLN: 3.0000 [drp] | Freq: Two times a day (BID) | OTIC | 11 refills | Status: DC

## 2017-04-06 MED ORDER — VENLAFAXINE HCL ER 37.5 MG PO CP24: 37.5000 mg | ORAL_CAPSULE | Freq: Every day | ORAL | 1 refills | Status: DC

## 2017-04-06 MED ORDER — ALBUTEROL SULFATE 108 (90 BASE) MCG/ACT IN AEPB: 2.0000 | INHALATION_SPRAY | RESPIRATORY_TRACT | 2 refills | Status: DC

## 2017-04-06 NOTE — Progress Notes (Signed)
Subjective:    Patient ID: [name], female    DOB: 1960-05-05, 57 y.o.   MRN: [id number]  HPI [name] a 57 yo obese female s/p gastric bypass, hypothyroidism, depression who presents to the clinic to discuss medications and follow up.   Tried and failed phentermine/belviq/topamax. She was losing weight on saxenda. She lost 24lbs up until October when saxenda was stopped due to cost. Pt has only gained about 5 total pounds back. She continues to exercise and try to watch diet. She is doing weight watchers. Insurance will not cover saxenda.   Pt needs labs and refills on levothyroxine.   [name] not taking wellbutrin. She did not feel like it was helping at all. She continues to have problems with mood. She feels down and unmotivated a lot. She does not like her job. She feels very over worked and stressed.   She does mention itching left ear. Cortisporin helped. She wondered if she could get prescription.   .. Active Ambulatory Problems    Diagnosis Date Noted  . Goiter, unspecified 12/01/2006  . THYROID NODULE 02/04/2009  . B12 DEFICIENCY 11/02/2007  . HYPERCHOLESTEROLEMIA 11/03/2005  . Major depressive disorder, recurrent episode ([place]) 11/03/2005  . VARICOSE VEINS 11/03/2005  . EXTERNAL HEMORRHOIDS WITHOUT MENTION COMP 10/24/2007  . ALLERGIC RHINITIS 04/06/2006  . HERNIA, UNILATERAL INGUINAL, W/O OBST/GNGR 05/06/2006  . Diverticulitis of colon (without mention of hemorrhage)(562.11) 11/03/2005  . ARTHRITIS, ACROMIOCLAVICULAR 05/08/2009  . KNEE PAIN, RIGHT, ACUTE 09/26/2009  . [place] DISEASE, CERVICAL 05/08/2009  . CERVICAL LYMPHADENOPATHY 10/24/2007  . BARIATRIC SURGERY STATUS 03/16/2007  . ASTHMA, WITH ACUTE EXACERBATION 02/05/2010  . Right upper quadrant pain 04/17/2010  . Thyromegaly 06/26/2010  . Fatigue 06/26/2010  . Migraines 06/26/2010  . VULVAR CANDIDIASIS 10/28/2010  . Asthma, mild intermittent 01/01/2014  . Depression 01/01/2014  . Thyroid activity decreased  01/01/2014  . Vitamin D deficiency 01/01/2014  . Obese 04/29/2015  . Adenomatous polyps 09/04/2015  . Swollen uvula 10/08/2015  . Primary osteoarthritis of both knees 03/25/2016  . BMI 32.0-32.9,adult 05/19/2016  . Dermatitis of left ear canal 04/08/2017   Resolved Ambulatory Problems    Diagnosis Date Noted  . Other chronic sinusitis 12/01/2006  . SHOULDER PAIN 02/04/2006  . EUSTACHIAN TUBE DYSFUNCTION, RIGHT 08/31/2010  . TINNITUS, RIGHT 08/31/2010  . ANAL PRURITUS 10/28/2010   Past Medical History:  Diagnosis Date  . Asthma   . DDD (degenerative disc disease)   . Diverticulosis   . Hemorrhoids   . Hemorrhoids   . History of gastric bypass   . Thyroid disease     .Marland Kitchen Family History  Problem Relation Age of Onset  . Hypertension Father   . Diabetes Father   . Cancer Father        melanoma   .Marland Kitchen Social History   Socioeconomic History  . Marital status: Legally Separated    Spouse name: Not on file  . Number of children: Not on file  . Years of education: Not on file  . Highest education level: Not on file  Social Needs  . Financial resource strain: Not on file  . Food insecurity - worry: Not on file  . Food insecurity - inability: Not on file  . Transportation needs - medical: Not on file  . Transportation needs - non-medical: Not on file  Occupational History  . Not on file  Tobacco Use  . Smoking status: Never Smoker  . Smokeless tobacco: Never Used  Substance and  Sexual Activity  . Alcohol use: No  . Drug use: No  . Sexual activity: Yes    Birth control/protection: Condom  Other Topics Concern  . Not on file  Social History Narrative  . Not on file     Review of Systems     Objective:   Physical Exam  Constitutional: She is oriented to person, place, and time. She appears well-developed and well-nourished.  HENT:  Head: Normocephalic and atraumatic.  Neck: Normal range of motion. Neck supple.  Cardiovascular: Normal rate, regular rhythm  and normal heart sounds.  Pulmonary/Chest: Effort normal and breath sounds normal. She has no wheezes.  Lymphadenopathy:    She has no cervical adenopathy.  Neurological: She is alert and oriented to person, place, and time.  Psychiatric: She has a normal mood and affect. Her behavior is normal.          Assessment & Plan:  Marland KitchenMarland KitchenRashada was seen today for follow-up.  Diagnoses and all orders for this visit:  Class 1 obesity due to excess calories without serious comorbidity with body mass index (BMI) of 30.0 to 30.9 in adult  Acquired hypothyroidism -     TSH + free T4 -     levothyroxine (SYNTHROID, LEVOTHROID) 88 MCG tablet; Take 1 tablet (88 mcg total) by mouth daily before breakfast.  Vitamin D deficiency -     Vitamin D 1,25 dihydroxy  Dermatitis of left ear canal -     acetic acid-hydrocortisone (VOSOL-HC) OTIC solution; Place 3 drops into both ears 2 (two) times daily.  Mild episode of recurrent major depressive disorder (HCC) -     venlafaxine XR (EFFEXOR-XR) 37.5 MG 24 hr capsule; Take 1 capsule (37.5 mg total) by mouth daily with breakfast.  Mild intermittent asthma, unspecified whether complicated -     Albuterol Sulfate (PROAIR RESPICLICK) 177 (90 Base) MCG/ACT AEPB; Inhale 2 puffs into the lungs every 4 (four) hours.  .. Depression screen [facility] 2/9 04/06/2017 08/18/2016 05/18/2016  Decreased Interest 0 0 0  Down, Depressed, Hopeless 0 0 0  PHQ - 2 Score 0 0 0  Altered sleeping 1 - -  Tired, decreased energy 2 - -  Change in appetite 1 - -  Feeling bad or failure about yourself  0 - -  Trouble concentrating 0 - -  Moving slowly or fidgety/restless 0 - -  PHQ-9 Score 4 - -  Difficult doing work/chores Not difficult at all - -   .Marland Kitchen GAD 7 : Generalized Anxiety Score 04/06/2017  Nervous, Anxious, on Edge 0  Control/stop worrying 1  Worry too much - different things 0  Trouble relaxing 0  Restless 0  Easily annoyed or irritable 0  Afraid - awful might happen 0   Total GAD 7 Score 1     Continue with weight watchers. I will write a letter for appeal for saxenda.   Lets try effexor for mood and activation.   Labs ordered for medication adjustment.   Will try acetic acid/cortisone combination for ear dermatitis.

## 2017-04-07 MED ORDER — LEVOTHYROXINE SODIUM 88 MCG PO TABS: 88.0000 ug | ORAL_TABLET | Freq: Every day | ORAL | 3 refills | Status: DC

## 2017-04-07 NOTE — Progress Notes (Signed)
TSH in normal range. Will refill medication.

## 2017-04-07 NOTE — Telephone Encounter (Signed)
Letter done

## 2017-04-07 NOTE — Telephone Encounter (Signed)
Appeal letter has been faxed to the patients insurance. Fax confirmation received as well. Awaiting determination.

## 2017-04-08 DIAGNOSIS — H60542 Acute eczematoid otitis externa, left ear: Secondary | ICD-10-CM | POA: Insufficient documentation

## 2017-04-08 LAB — VITAMIN D 1,25 DIHYDROXY
Vitamin D 1, 25 (OH)2 Total: 49 pg/mL (ref 18–72)
Vitamin D2 1, 25 (OH)2: 25 pg/mL
Vitamin D3 1, 25 (OH)2: 24 pg/mL

## 2017-04-08 LAB — TSH+FREE T4: TSH W/REFLEX TO FT4: 3.12 m[IU]/L

## 2017-04-08 NOTE — Progress Notes (Signed)
Call pt: vitamin D ok.

## 2017-04-14 NOTE — Telephone Encounter (Signed)
Received a denial letter from [employer] that Saxenda was denied. Letter sent to scan.

## 2017-04-16 ENCOUNTER — Other Ambulatory Visit: Payer: Self-pay | Admitting: Physician Assistant

## 2017-04-16 ENCOUNTER — Telehealth: Payer: Self-pay | Admitting: Physician Assistant

## 2017-04-16 MED ORDER — NALTREXONE-BUPROPION HCL ER 8-90 MG PO TB12: ORAL_TABLET | ORAL | 0 refills | Status: DC

## 2017-04-16 NOTE — Addendum Note (Signed)
Addended by: Dessie Coma on: 04/16/2017 08:41 AM   Modules accepted: Orders

## 2017-04-16 NOTE — Telephone Encounter (Signed)
Spoke with the patient and she is agreeable to try the Contrave and she wants it sent to [facility] on [address]. Patient has tried Wellbutrin in the past and it did not help her symptoms. Patient is not currently on any Opoid medications.

## 2017-04-16 NOTE — Progress Notes (Signed)
Ok printed will fax.

## 2017-04-16 NOTE — Telephone Encounter (Signed)
Printed contrave please fax to [facility].

## 2017-04-16 NOTE — Telephone Encounter (Signed)
Received fax from [employer] that Contrave requires a PA. Information has been sent to the insurance company. Awaiting determination.   

## 2017-04-16 NOTE — Telephone Encounter (Signed)
Per note from [facility] the patient will have to fail Contrave before she can try Saxenda. Updated Rx has been sent to [facility]. Patient is aware.

## 2017-04-19 ENCOUNTER — Telehealth: Payer: Self-pay

## 2017-04-19 NOTE — Telephone Encounter (Signed)
Received fax from [facility] that Contrave was approved from 04/16/2017 until 10/17/2017. Pharmacy notified and forms sent to scan.   Reference ID: [id number].

## 2017-04-19 NOTE — Telephone Encounter (Signed)
Tim, Software engineer at [employer], wanted to call and advise [name] of major drug interactions between pt's new RX for Contrave and her Effexor. Please advise on what you would like done. Thanks!

## 2017-04-20 NOTE — Telephone Encounter (Signed)
Can we use this contraindication to get saxenda approved. Call patient and keep her updated on what is going on. She called and left voicemail today.

## 2017-04-21 ENCOUNTER — Other Ambulatory Visit: Payer: Self-pay

## 2017-04-21 MED ORDER — LIRAGLUTIDE -WEIGHT MANAGEMENT 18 MG/3ML ~~LOC~~ SOPN: 3.0000 mg | PEN_INJECTOR | Freq: Every day | SUBCUTANEOUS | 1 refills | Status: DC

## 2017-04-21 MED ORDER — LIRAGLUTIDE -WEIGHT MANAGEMENT 18 MG/3ML ~~LOC~~ SOPN: 3.0000 mL | PEN_INJECTOR | Freq: Every day | SUBCUTANEOUS | 1 refills | Status: DC

## 2017-04-21 NOTE — Telephone Encounter (Signed)
I have sent the new information about the Contrave interaction per Jade's request  and awaiting determination.

## 2017-04-21 NOTE — Addendum Note (Signed)
Addended by: Dessie Coma on: 04/21/2017 03:48 PM   Modules accepted: Orders

## 2017-04-21 NOTE — Telephone Encounter (Signed)
Called pt and advised her of current situation. Told pt to continue with her Effexor and not pick up  Contrave from pharmacy. Pt OK with waiting to see if this info about interaction can be used to get her Saxenda approved. Barnet Pall, can you please look into this and see if we can get this approved? Thanks

## 2017-04-22 NOTE — Telephone Encounter (Signed)
Received letter from [facility] that Saxenda did not require PA. Letter will be faxed to the pharmacy. Form sent to scan.   Patient is aware that pharmacy will contact her when the Rx is ready and patient voices understanding.

## 2017-04-23 ENCOUNTER — Ambulatory Visit (INDEPENDENT_AMBULATORY_CARE_PROVIDER_SITE_OTHER): Payer: Managed Care, Other (non HMO) | Admitting: Physician Assistant

## 2017-04-23 ENCOUNTER — Encounter: Payer: Self-pay | Admitting: Physician Assistant

## 2017-04-23 VITALS — BP 131/59 | HR 69

## 2017-04-23 DIAGNOSIS — M62838 Other muscle spasm: Secondary | ICD-10-CM | POA: Insufficient documentation

## 2017-04-23 DIAGNOSIS — R110 Nausea: Secondary | ICD-10-CM

## 2017-04-23 DIAGNOSIS — M542 Cervicalgia: Secondary | ICD-10-CM | POA: Diagnosis not present

## 2017-04-23 DIAGNOSIS — T50905A Adverse effect of unspecified drugs, medicaments and biological substances, initial encounter: Secondary | ICD-10-CM

## 2017-04-23 MED ORDER — CYCLOBENZAPRINE HCL 10 MG PO TABS: 10.0000 mg | ORAL_TABLET | Freq: Three times a day (TID) | ORAL | 0 refills | Status: DC | PRN

## 2017-04-23 MED ORDER — HYDROCODONE-ACETAMINOPHEN 5-325 MG PO TABS: 1.0000 | ORAL_TABLET | Freq: Three times a day (TID) | ORAL | 0 refills | Status: AC | PRN

## 2017-04-23 MED ORDER — ONDANSETRON HCL 8 MG PO TABS: 8.0000 mg | ORAL_TABLET | Freq: Three times a day (TID) | ORAL | 0 refills | Status: DC | PRN

## 2017-04-23 MED ORDER — KETOROLAC TROMETHAMINE 60 MG/2ML IM SOLN
60.0000 mg | Freq: Once | INTRAMUSCULAR | Status: AC
  Administered 2017-04-23: 60 mg via INTRAMUSCULAR

## 2017-04-23 NOTE — Progress Notes (Signed)
Opened in error

## 2017-04-23 NOTE — Progress Notes (Signed)
Subjective:    Patient ID: [name], female    DOB: 05/28/1960, 57 y.o.   MRN: [id number]  HPI  [name] a 57 yo female who had a severe reaction to contrave. She took it yesterday for the first time and starting having severe neck pain from muscle spasms in the neck within an hour. Neck pain was unbearable yesterday but improved some today. Nausea off and on. Taking ibuprofen and flexeril with little benefit. Neck still tight and creating a headache. No SOB, lip swelling, difficultly breathing, cough, rash. No other injuries or changes.   .. Active Ambulatory Problems    Diagnosis Date Noted  . Goiter, unspecified 12/01/2006  . THYROID NODULE 02/04/2009  . B12 DEFICIENCY 11/02/2007  . HYPERCHOLESTEROLEMIA 11/03/2005  . Major depressive disorder, recurrent episode ([place]) 11/03/2005  . VARICOSE VEINS 11/03/2005  . EXTERNAL HEMORRHOIDS WITHOUT MENTION COMP 10/24/2007  . ALLERGIC RHINITIS 04/06/2006  . HERNIA, UNILATERAL INGUINAL, W/O OBST/GNGR 05/06/2006  . Diverticulitis of colon (without mention of hemorrhage)(562.11) 11/03/2005  . ARTHRITIS, ACROMIOCLAVICULAR 05/08/2009  . KNEE PAIN, RIGHT, ACUTE 09/26/2009  . [place] DISEASE, CERVICAL 05/08/2009  . CERVICAL LYMPHADENOPATHY 10/24/2007  . BARIATRIC SURGERY STATUS 03/16/2007  . ASTHMA, WITH ACUTE EXACERBATION 02/05/2010  . Right upper quadrant pain 04/17/2010  . Thyromegaly 06/26/2010  . Fatigue 06/26/2010  . Migraines 06/26/2010  . VULVAR CANDIDIASIS 10/28/2010  . Asthma, mild intermittent 01/01/2014  . Depression 01/01/2014  . Thyroid activity decreased 01/01/2014  . Vitamin D deficiency 01/01/2014  . Obese 04/29/2015  . Adenomatous polyps 09/04/2015  . Swollen uvula 10/08/2015  . Primary osteoarthritis of both knees 03/25/2016  . BMI 32.0-32.9,adult 05/19/2016  . Dermatitis of left ear canal 04/08/2017  . Nausea 04/23/2017  . Drug reaction 04/23/2017  . Muscle spasm 04/23/2017  . Neck pain 04/23/2017   Resolved  Ambulatory Problems    Diagnosis Date Noted  . Other chronic sinusitis 12/01/2006  . SHOULDER PAIN 02/04/2006  . EUSTACHIAN TUBE DYSFUNCTION, RIGHT 08/31/2010  . TINNITUS, RIGHT 08/31/2010  . ANAL PRURITUS 10/28/2010   Past Medical History:  Diagnosis Date  . Asthma   . DDD (degenerative disc disease)   . Diverticulosis   . Hemorrhoids   . Hemorrhoids   . History of gastric bypass   . Thyroid disease      Review of Systems  All other systems reviewed and are negative.      Objective:   Physical Exam  Constitutional: She is oriented to person, place, and time. She appears well-developed and well-nourished.  HENT:  Head: Normocephalic and atraumatic.  Cardiovascular: Normal rate, regular rhythm and normal heart sounds.  Pulmonary/Chest: Effort normal and breath sounds normal.  Musculoskeletal:  Tight and tender paraspinal cervical muscles. No tenderness over cspine.  Pain with ROM of neck able to get just past 45 degrees.   Neurological: She is alert and oriented to person, place, and time.  Skin: Skin is dry.  Psychiatric: She has a normal mood and affect. Her behavior is normal.          Assessment & Plan:  Marland KitchenMarland KitchenDiagnoses and all orders for this visit:  Adverse effect of drug, initial encounter  Neck pain -     HYDROcodone-acetaminophen (NORCO/VICODIN) 5-325 MG tablet; Take 1 tablet by mouth every 8 (eight) hours as needed for up to 5 days for moderate pain. -     cyclobenzaprine (FLEXERIL) 10 MG tablet; Take 1 tablet (10 mg total) by mouth 3 (three) times daily as  needed for muscle spasms. -     ketorolac (TORADOL) injection 60 mg  Muscle spasm -     cyclobenzaprine (FLEXERIL) 10 MG tablet; Take 1 tablet (10 mg total) by mouth 3 (three) times daily as needed for muscle spasms. -     ketorolac (TORADOL) injection 60 mg  Nausea -     ondansetron (ZOFRAN) 8 MG tablet; Take 1 tablet (8 mg total) by mouth every 8 (eight) hours as needed for nausea or  vomiting.   Timing does seem consistent with taking contrave and reaction. I did express I have not seen a reaction like this one. She is improving so added to allergy list and symptoms so severe do not want her trying again. Toradol given in office today. Flexeril and zofran given to take home. norco 5 day supply to use as needed for severe neck pain. HO given with stretches and symptomatic care given. Call on Monday with update. Will see if we can now get saxenda approved for weight loss.   Note for work for yesterday and today.   [place] controlled substance database reviewed without any concerns.

## 2017-04-23 NOTE — Patient Instructions (Signed)
Given norco small quanity.  zofran as needed for nausea.  Warm compresses on neck.

## 2017-04-25 ENCOUNTER — Encounter: Payer: Self-pay | Admitting: Physician Assistant

## 2017-04-25 ENCOUNTER — Telehealth: Payer: Self-pay | Admitting: Physician Assistant

## 2017-04-25 NOTE — Telephone Encounter (Signed)
Just a reminder if there is any resubmission needed since pt had reaction to contrave to get saxenda approved. thanks

## 2017-04-26 NOTE — Telephone Encounter (Signed)
Information has been submitted to the insurance company for an appeal. Awaiting response.

## 2017-04-27 NOTE — Telephone Encounter (Signed)
Received fax from [facility] that  was approved from 04/15/2017 until 10/27/2017 Pharmacy notified and forms sent to scan.   Reference ID: [id number].

## 2017-08-04 ENCOUNTER — Other Ambulatory Visit: Payer: Self-pay | Admitting: Physician Assistant

## 2017-08-06 ENCOUNTER — Other Ambulatory Visit: Payer: Self-pay | Admitting: Physician Assistant

## 2017-08-09 ENCOUNTER — Encounter: Payer: Self-pay | Admitting: Physician Assistant

## 2017-08-09 ENCOUNTER — Ambulatory Visit: Payer: Managed Care, Other (non HMO) | Admitting: Physician Assistant

## 2017-08-09 VITALS — BP 119/55 | HR 70 | Wt 183.0 lb

## 2017-08-09 DIAGNOSIS — E663 Overweight: Secondary | ICD-10-CM | POA: Diagnosis not present

## 2017-08-09 DIAGNOSIS — F330 Major depressive disorder, recurrent, mild: Secondary | ICD-10-CM | POA: Diagnosis not present

## 2017-08-09 MED ORDER — BUPROPION HCL ER (XL) 150 MG PO TB24: ORAL_TABLET | ORAL | 2 refills | Status: DC

## 2017-08-09 NOTE — Progress Notes (Signed)
Subjective:    Patient ID: [name], female    DOB: 05/10/1960, 57 y.o.   MRN: [id number]  HPI [name] a 57 yo female who presents to the clinic to follow upon weight. She has lost 14lbs in 3 months since starting saxenda in march. She denies any problems or side effects. She denies any exercise but just watching her diet.   Her depression is not controlled. She did not feel like effexor was very helpful at all. She stopped it. She is not on any anti depressants currently. She finds most of her depression is centered around work. She is unhappy with her work environment. She is overworked and finding it hard to focus and concentrate. No SI/HC.   Marland Kitchen. Active Ambulatory Problems    Diagnosis Date Noted  . Goiter, unspecified 12/01/2006  . THYROID NODULE 02/04/2009  . B12 DEFICIENCY 11/02/2007  . HYPERCHOLESTEROLEMIA 11/03/2005  . Major depressive disorder, recurrent episode ([place]) 11/03/2005  . VARICOSE VEINS 11/03/2005  . EXTERNAL HEMORRHOIDS WITHOUT MENTION COMP 10/24/2007  . ALLERGIC RHINITIS 04/06/2006  . HERNIA, UNILATERAL INGUINAL, W/O OBST/GNGR 05/06/2006  . Diverticulitis of colon (without mention of hemorrhage)(562.11) 11/03/2005  . ARTHRITIS, ACROMIOCLAVICULAR 05/08/2009  . KNEE PAIN, RIGHT, ACUTE 09/26/2009  . [place] DISEASE, CERVICAL 05/08/2009  . CERVICAL LYMPHADENOPATHY 10/24/2007  . BARIATRIC SURGERY STATUS 03/16/2007  . ASTHMA, WITH ACUTE EXACERBATION 02/05/2010  . Right upper quadrant pain 04/17/2010  . Thyromegaly 06/26/2010  . Fatigue 06/26/2010  . Migraines 06/26/2010  . VULVAR CANDIDIASIS 10/28/2010  . Asthma, mild intermittent 01/01/2014  . Depression 01/01/2014  . Thyroid activity decreased 01/01/2014  . Vitamin D deficiency 01/01/2014  . Obese 04/29/2015  . Adenomatous polyps 09/04/2015  . Swollen uvula 10/08/2015  . Primary osteoarthritis of both knees 03/25/2016  . BMI 32.0-32.9,adult 05/19/2016  . Dermatitis of left ear canal 04/08/2017  . Nausea  04/23/2017  . Drug reaction 04/23/2017  . Muscle spasm 04/23/2017  . Neck pain 04/23/2017   Resolved Ambulatory Problems    Diagnosis Date Noted  . Other chronic sinusitis 12/01/2006  . SHOULDER PAIN 02/04/2006  . EUSTACHIAN TUBE DYSFUNCTION, RIGHT 08/31/2010  . TINNITUS, RIGHT 08/31/2010  . ANAL PRURITUS 10/28/2010   Past Medical History:  Diagnosis Date  . Asthma   . DDD (degenerative disc disease)   . Diverticulosis   . Hemorrhoids   . Hemorrhoids   . History of gastric bypass   . Thyroid disease       Review of Systems See HPI.     Objective:   Physical Exam  Constitutional: She is oriented to person, place, and time. She appears well-developed and well-nourished.  HENT:  Head: Normocephalic and atraumatic.  Cardiovascular: Normal rate and regular rhythm.  Pulmonary/Chest: Effort normal and breath sounds normal.  Neurological: She is alert and oriented to person, place, and time.  Psychiatric: She has a normal mood and affect. Her behavior is normal.          Assessment & Plan:  Marland KitchenMarland KitchenVeeda was seen today for follow-up.  Diagnoses and all orders for this visit:  Overweight (BMI 25.0-29.9)  Mild episode of recurrent major depressive disorder (HCC) -     buPROPion (WELLBUTRIN XL) 150 MG 24 hr tablet; Take one tablet daily for 7 days then increase to 2 tablets daily.  pt doing amazing with weight loss. Keep up the good work. Add exercise in. It could help with both mood and weight. Refilled and Follow up in 6 months.  Start wellbutrin for mood. It could also help with focus. Discussed to reach out if not able to tolerate. Follow up in 3 months.

## 2017-08-10 DIAGNOSIS — E663 Overweight: Secondary | ICD-10-CM | POA: Insufficient documentation

## 2017-08-31 ENCOUNTER — Other Ambulatory Visit: Payer: Self-pay | Admitting: Physician Assistant

## 2017-08-31 DIAGNOSIS — F330 Major depressive disorder, recurrent, mild: Secondary | ICD-10-CM

## 2017-12-08 ENCOUNTER — Ambulatory Visit (INDEPENDENT_AMBULATORY_CARE_PROVIDER_SITE_OTHER): Payer: No Typology Code available for payment source | Admitting: Physician Assistant

## 2017-12-08 ENCOUNTER — Encounter: Payer: Self-pay | Admitting: Physician Assistant

## 2017-12-08 VITALS — BP 116/52 | HR 73 | Ht 67.0 in | Wt 191.0 lb

## 2017-12-08 DIAGNOSIS — E663 Overweight: Secondary | ICD-10-CM | POA: Diagnosis not present

## 2017-12-08 DIAGNOSIS — E039 Hypothyroidism, unspecified: Secondary | ICD-10-CM | POA: Diagnosis not present

## 2017-12-08 DIAGNOSIS — E041 Nontoxic single thyroid nodule: Secondary | ICD-10-CM

## 2017-12-08 DIAGNOSIS — J340 Abscess, furuncle and carbuncle of nose: Secondary | ICD-10-CM | POA: Diagnosis not present

## 2017-12-08 MED ORDER — DOXYCYCLINE HYCLATE 100 MG PO TABS: 100.0000 mg | ORAL_TABLET | Freq: Two times a day (BID) | ORAL | 0 refills | Status: DC

## 2017-12-08 NOTE — Progress Notes (Signed)
Subjective:    Patient ID: [name], female    DOB: February 06, 1960, 57 y.o.   MRN: [id number]  HPI  [name] a 57 yo female who presents to the clinic with red and painful right nare.   She has noticed some nare erythema for most of the year off and on. Lately it is more swollen and extremely red. Nothing seems to make better or worse. She has put some neosporin creams. No fever, chills, body aches.   Pt would like to get back on saxenda. Weight started to creep back up. She went due to job change and insurance issues.   Hx of thyroid nodules and hypothyroidism. She has not had checked recently. She admits to fatigue, weight gain, and dry skin. She would like to have checked.   .. Active Ambulatory Problems    Diagnosis Date Noted  . Goiter, unspecified 12/01/2006  . THYROID NODULE 02/04/2009  . B12 DEFICIENCY 11/02/2007  . HYPERCHOLESTEROLEMIA 11/03/2005  . Major depressive disorder, recurrent episode ([place]) 11/03/2005  . VARICOSE VEINS 11/03/2005  . EXTERNAL HEMORRHOIDS WITHOUT MENTION COMP 10/24/2007  . ALLERGIC RHINITIS 04/06/2006  . HERNIA, UNILATERAL INGUINAL, W/O OBST/GNGR 05/06/2006  . Diverticulitis of colon (without mention of hemorrhage)(562.11) 11/03/2005  . ARTHRITIS, ACROMIOCLAVICULAR 05/08/2009  . KNEE PAIN, RIGHT, ACUTE 09/26/2009  . [place] DISEASE, CERVICAL 05/08/2009  . CERVICAL LYMPHADENOPATHY 10/24/2007  . BARIATRIC SURGERY STATUS 03/16/2007  . ASTHMA, WITH ACUTE EXACERBATION 02/05/2010  . Right upper quadrant pain 04/17/2010  . Thyromegaly 06/26/2010  . Fatigue 06/26/2010  . Migraines 06/26/2010  . VULVAR CANDIDIASIS 10/28/2010  . Asthma, mild intermittent 01/01/2014  . Depression 01/01/2014  . Thyroid activity decreased 01/01/2014  . Vitamin D deficiency 01/01/2014  . Obese 04/29/2015  . Adenomatous polyps 09/04/2015  . Swollen uvula 10/08/2015  . Primary osteoarthritis of both knees 03/25/2016  . BMI 32.0-32.9,adult 05/19/2016  . Dermatitis of left  ear canal 04/08/2017  . Nausea 04/23/2017  . Drug reaction 04/23/2017  . Muscle spasm 04/23/2017  . Neck pain 04/23/2017  . Overweight (BMI 25.0-29.9) 08/10/2017   Resolved Ambulatory Problems    Diagnosis Date Noted  . Other chronic sinusitis 12/01/2006  . SHOULDER PAIN 02/04/2006  . EUSTACHIAN TUBE DYSFUNCTION, RIGHT 08/31/2010  . TINNITUS, RIGHT 08/31/2010  . ANAL PRURITUS 10/28/2010   Past Medical History:  Diagnosis Date  . Asthma   . DDD (degenerative disc disease)   . Diverticulosis   . Hemorrhoids   . Hemorrhoids   . History of gastric bypass   . Thyroid disease       Review of Systems See HPI.     Objective:   Physical Exam  Constitutional: She is oriented to person, place, and time. She appears well-developed and well-nourished.  HENT:  Head: Normocephalic and atraumatic.    Right Ear: External ear normal.  Left Ear: External ear normal.  Mouth/Throat: Oropharynx is clear and moist.  Eyes: Conjunctivae are normal. Right eye exhibits no discharge. Left eye exhibits no discharge.  Neck: Normal range of motion. Neck supple. Thyromegaly present.  Cardiovascular: Normal rate and regular rhythm.  Pulmonary/Chest: Effort normal and breath sounds normal.  Neurological: She is alert and oriented to person, place, and time.  Skin: No rash noted.  Psychiatric: She has a normal mood and affect. Her behavior is normal.          Assessment & Plan:  Marland KitchenMarland KitchenDiagnoses and all orders for this visit:  Cellulitis of nose, external -  doxycycline (VIBRA-TABS) 100 MG tablet; Take 1 tablet (100 mg total) by mouth 2 (two) times daily. For 10 days.  Acquired hypothyroidism -     TSH -     US THYROID  Overweight (BMI 25.0-29.9)  THYROID NODULE -     US THYROID   Nose appears like a cellulitis. Treated with doxycycline and warm compresses. bactroban to use internal nose. If not improving will send to dermatology.   Ordered u/s of thyroid and TSH to reevaluate  thyroid.   Marland Kitchen.Discussed low carb diet with 1500 calories and 80g of protein.  Exercising at least 150 minutes a week.  [employer] could be a Microbiologist.  Needs to restart saxenda before gains any more weight. Will do PA if needed.

## 2017-12-09 LAB — TSH: TSH: 0.98 mIU/L (ref 0.40–4.50)

## 2017-12-10 NOTE — Progress Notes (Signed)
Call pt: thyroid normal range. Do you need refills. Ok to give another 1 year if so.

## 2017-12-12 ENCOUNTER — Encounter: Payer: Self-pay | Admitting: Physician Assistant

## 2017-12-29 ENCOUNTER — Ambulatory Visit (INDEPENDENT_AMBULATORY_CARE_PROVIDER_SITE_OTHER): Payer: No Typology Code available for payment source

## 2017-12-29 DIAGNOSIS — E039 Hypothyroidism, unspecified: Secondary | ICD-10-CM

## 2017-12-29 DIAGNOSIS — E041 Nontoxic single thyroid nodule: Secondary | ICD-10-CM

## 2017-12-30 NOTE — Progress Notes (Signed)
Call pt: heterogenous thyroid(lots of different tissue types) but no one nodule. consistent with thyroid disease. Treatment plan stays the same.

## 2018-01-14 ENCOUNTER — Other Ambulatory Visit: Payer: Self-pay

## 2018-01-14 ENCOUNTER — Encounter: Payer: Self-pay | Admitting: Emergency Medicine

## 2018-01-14 ENCOUNTER — Emergency Department (INDEPENDENT_AMBULATORY_CARE_PROVIDER_SITE_OTHER)
Admission: EM | Admit: 2018-01-14 | Discharge: 2018-01-14 | Disposition: A | Discharge status: Home / Self Care | Payer: No Typology Code available for payment source | Source: Home / Self Care | Attending: Family Medicine | Admitting: Family Medicine

## 2018-01-14 DIAGNOSIS — H1031 Unspecified acute conjunctivitis, right eye: Secondary | ICD-10-CM

## 2018-01-14 MED ORDER — POLYMYXIN B-TRIMETHOPRIM 10000-0.1 UNIT/ML-% OP SOLN: 1.0000 [drp] | OPHTHALMIC | 0 refills | Status: DC

## 2018-01-14 NOTE — ED Triage Notes (Signed)
Right eye hurts, burns, crusty this morning

## 2018-01-14 NOTE — ED Provider Notes (Addendum)
[facility]    CSN: [id number] Arrival date & time: 01/14/18  0805     History   Chief Complaint Chief Complaint  Patient presents with  . Eye Problem    HPI [name] is a 57 y.o. female.   HPI [name] is a 57 y.o. female presenting to UC with c/o Right eye pain and drainage for 2 days. Redness and mild itching, soreness. "It feels like something is in there."  Crusty drainage this morning. She has used Zaditor eye drops w/o relief.  Mild nasal congestion. Denies change in vision. No fever.  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 Asthma    mild intermittent  . DDD (degenerative disc disease)    c spine  . Diverticulosis   . Hemorrhoids    Dr Zettie Pho  . Hemorrhoids   . History of gastric bypass   . Thyroid disease     Patient Active Problem List   Diagnosis Date Noted  . Overweight (BMI 25.0-29.9) 08/10/2017  . Nausea 04/23/2017  . Drug reaction 04/23/2017  . Muscle spasm 04/23/2017  . Neck pain 04/23/2017  . Dermatitis of left ear canal 04/08/2017  . BMI 32.0-32.9,adult 05/19/2016  . Primary osteoarthritis of both knees 03/25/2016  . Swollen uvula 10/08/2015  . Adenomatous polyps 09/04/2015  . Obese 04/29/2015  . Asthma, mild intermittent 01/01/2014  . Depression 01/01/2014  . Thyroid activity decreased 01/01/2014  . Vitamin D deficiency 01/01/2014  . VULVAR CANDIDIASIS 10/28/2010  . Thyromegaly 06/26/2010  . Fatigue 06/26/2010  . Migraines 06/26/2010  . Right upper quadrant pain 04/17/2010  . ASTHMA, WITH ACUTE EXACERBATION 02/05/2010  . KNEE PAIN, RIGHT, ACUTE 09/26/2009  . ARTHRITIS, ACROMIOCLAVICULAR 05/08/2009  . [place] DISEASE, CERVICAL 05/08/2009  . THYROID NODULE 02/04/2009  . B12 DEFICIENCY 11/02/2007  . EXTERNAL HEMORRHOIDS WITHOUT MENTION COMP 10/24/2007  . CERVICAL LYMPHADENOPATHY 10/24/2007  . BARIATRIC SURGERY STATUS 03/16/2007  .  Goiter, unspecified 12/01/2006  . HERNIA, UNILATERAL INGUINAL, W/O OBST/GNGR 05/06/2006  . ALLERGIC RHINITIS 04/06/2006  . HYPERCHOLESTEROLEMIA 11/03/2005  . Major depressive disorder, recurrent episode ([place]) 11/03/2005  . VARICOSE VEINS 11/03/2005  . Diverticulitis of colon (without mention of hemorrhage)(562.11) 11/03/2005    Past Surgical History:  Procedure Laterality Date  . APPENDECTOMY    . DILATION AND CURETTAGE OF UTERUS    . HEEL SPUR SURGERY     R/L  . HERNIA REPAIR  2008   R inguinal hernia  . lap gaastric bypass    . LTCS     X 3   . NEUROMA SURGERY     left  . TOTAL ABDOMINAL HYSTERECTOMY     w/o oophorectomy/ non cancerous    OB History   No obstetric history on file.      Home Medications    Prior to Admission medications   Medication Sig Start Date End Date Taking? Authorizing Provider  acetic acid-hydrocortisone (VOSOL-HC) OTIC solution Place 3 drops into both ears 2 (two) times daily. 04/06/17   Breeback, Jade L, PA-C  Albuterol Sulfate (PROAIR RESPICLICK) 782 (90 Base) MCG/ACT AEPB Inhale 2 puffs into the lungs every 4 (four) hours. 04/06/17   Breeback, Luvenia Starch L, PA-C  calcium citrate-vitamin D (CITRACAL+D) 315-200 MG-UNIT tablet Take by mouth.    [provider]  cetirizine (ZYRTEC) 10 MG tablet Take by mouth.    [provider]  Cholecalciferol (VITAMIN D  PO) Take 5,000 Int'l Units by mouth daily.    [provider]  cyclobenzaprine (FLEXERIL) 10 MG tablet Take 1 tablet (10 mg total) by mouth 3 (three) times daily as needed for muscle spasms. 04/23/17   Breeback, Royetta Car, PA-C  doxycycline (VIBRA-TABS) 100 MG tablet Take 1 tablet (100 mg total) by mouth 2 (two) times daily. For 10 days. 12/08/17   Donella Stade, PA-C  Ferrous Sulfate (IRON) 28 MG TABS Take 365 mg by mouth daily.     [provider]  levothyroxine (SYNTHROID, LEVOTHROID) 88 MCG tablet Take 1 tablet (88 mcg total) by mouth daily before breakfast. 04/07/17    Breeback, Jade L, PA-C  lidocaine (XYLOCAINE) 2 % jelly Apply topically. 04/10/13   [provider]  meloxicam (MOBIC) 15 MG tablet Take 1 tablet (15 mg total) by mouth daily. 04/21/16   Breeback, Royetta Car, PA-C  Multiple Vitamin (MULTIVITAMIN) capsule Take by mouth.    [provider]  NOVOFINE 32G X 6 MM MISC USE AS DIRECETED WITH SAXENDA 08/09/17   Breeback, Jade L, PA-C  ondansetron (ZOFRAN) 8 MG tablet Take 1 tablet (8 mg total) by mouth every 8 (eight) hours as needed for nausea or vomiting. 04/23/17   Breeback, Jade L, PA-C  pantoprazole (PROTONIX) 40 MG tablet Take by mouth. 10/01/15   [provider]  promethazine (PHENERGAN) 25 MG tablet Take by mouth. 04/01/14   [provider]  SAXENDA 18 MG/3ML SOPN Inject 3 mg into the skin daily. 08/09/17   Breeback, Royetta Car, PA-C  trimethoprim-polymyxin b (POLYTRIM) ophthalmic solution Place 1 drop into the right eye every 4 (four) hours. For 5 days 01/14/18   Noe Gens, PA-C  valACYclovir (VALTREX) 1000 MG tablet Take 1 tablet (1,000 mg total) by mouth 2 (two) times daily. 03/17/16   Breeback, Royetta Car, PA-C  Vitamin D, Ergocalciferol, (DRISDOL) 50000 units CAPS capsule Take 1 capsule (50,000 Units total) by mouth every 7 (seven) days. 01/13/16   Donella Stade, PA-C    Family History Family History  Problem Relation Age of Onset  . Hypertension Father   . Diabetes Father   . Cancer Father        melanoma    Social History Social History   Tobacco Use  . Smoking status: Never Smoker  . Smokeless tobacco: Never Used  Substance Use Topics  . Alcohol use: No  . Drug use: No     Allergies   Contrave [naltrexone-bupropion hcl er]; Phentermine; and Sulfonamide derivatives   Review of Systems Review of Systems  HENT: Positive for congestion. Negative for ear pain and sore throat.   Eyes: Positive for pain, discharge, redness and itching. Negative for photophobia and visual disturbance.  Respiratory:  Negative for cough.   Neurological: Negative for dizziness and headaches.     Physical Exam Triage Vital Signs ED Triage Vitals  Enc Vitals Group     BP      Pulse      Resp      Temp      Temp src      SpO2      Weight      Height      Head Circumference      Peak Flow      Pain Score      Pain Loc      Pain Edu?      Excl. in [place]?    No data found.  Updated Vital  Signs There were no vitals taken for this visit.  Visual Acuity Right Eye Distance: 20/25 Left Eye Distance: 20/25 Bilateral Distance: 20/25(with correction)  Right Eye Near:   Left Eye Near:    Bilateral Near:     Physical Exam Vitals signs and nursing note reviewed.  Constitutional:      Appearance: She is well-developed.  HENT:     Head: Normocephalic and atraumatic.     Right Ear: Tympanic membrane normal.     Left Ear: Tympanic membrane normal.     Nose: Nose normal.     Mouth/Throat:     Mouth: Mucous membranes are moist.  Eyes:     General: Lids are normal.        Right eye: No foreign body or discharge.        Left eye: No foreign body or discharge.     Conjunctiva/sclera:     Right eye: Right conjunctiva is injected.  Neck:     Musculoskeletal: Normal range of motion.  Cardiovascular:     Rate and Rhythm: Normal rate.  Pulmonary:     Effort: Pulmonary effort is normal.  Musculoskeletal: Normal range of motion.  Skin:    General: Skin is warm and dry.  Neurological:     Mental Status: She is alert and oriented to person, place, and time.  Psychiatric:        Behavior: Behavior normal.      UC Treatments / Results  Labs (all labs ordered are listed, but only abnormal results are displayed) Labs Reviewed - No data to display  EKG None  Radiology No results found.  Procedures Procedures (including critical care time)  Medications Ordered in UC Medications - No data to display  Initial Impression / Assessment and Plan / UC Course  I have reviewed the triage vital  signs and the nursing notes.  Pertinent labs & imaging results that were available during my care of the patient were reviewed by me and considered in my medical decision making (see chart for details).     Will tx with Polytrim given that the Zaditor drops did not provide any relief.   Final Clinical Impressions(s) / UC Diagnoses   Final diagnoses:  Acute bacterial conjunctivitis of right eye   Discharge Instructions   None    ED Prescriptions    Medication Sig Dispense Auth. Provider   trimethoprim-polymyxin b (POLYTRIM) ophthalmic solution  (Status: Discontinued) Place 1 drop into the right eye every 4 (four) hours. 10 mL Leeroy Cha O, PA-C   trimethoprim-polymyxin b (POLYTRIM) ophthalmic solution Place 1 drop into the right eye every 4 (four) hours. For 5 days 10 mL Noe Gens, PA-C     Controlled Substance Prescriptions [place] Controlled Substance Registry consulted? Not Applicable       Tyrell Antonio 01/14/18 1408

## 2018-01-20 ENCOUNTER — Other Ambulatory Visit: Payer: Self-pay | Admitting: Physician Assistant

## 2018-04-11 ENCOUNTER — Other Ambulatory Visit: Payer: Self-pay | Admitting: Physician Assistant

## 2018-04-11 DIAGNOSIS — E039 Hypothyroidism, unspecified: Secondary | ICD-10-CM

## 2018-05-30 ENCOUNTER — Telehealth: Payer: Self-pay | Admitting: Neurology

## 2018-05-30 NOTE — Telephone Encounter (Signed)
Received refill request for Hydrocortisone Acetic Acid Ear drops from [facility]. Last written in 03/2017.  If patient having new issues with ears, she needs appt with Luvenia Starch to discuss.   [name] - can you call patient to schedule appt please?

## 2018-05-30 NOTE — Telephone Encounter (Signed)
Left message with information below and for patient to call us back to schedule an appointment or webex. °

## 2018-06-22 ENCOUNTER — Ambulatory Visit (INDEPENDENT_AMBULATORY_CARE_PROVIDER_SITE_OTHER): Payer: No Typology Code available for payment source | Admitting: Physician Assistant

## 2018-06-22 ENCOUNTER — Encounter: Payer: Self-pay | Admitting: Physician Assistant

## 2018-06-22 VITALS — BP 128/48 | HR 70 | Temp 97.9°F | Ht 67.0 in | Wt 208.0 lb

## 2018-06-22 DIAGNOSIS — M170 Bilateral primary osteoarthritis of knee: Secondary | ICD-10-CM

## 2018-06-22 DIAGNOSIS — E6609 Other obesity due to excess calories: Secondary | ICD-10-CM | POA: Diagnosis not present

## 2018-06-22 DIAGNOSIS — Z6835 Body mass index (BMI) 35.0-35.9, adult: Secondary | ICD-10-CM

## 2018-06-22 DIAGNOSIS — H60542 Acute eczematoid otitis externa, left ear: Secondary | ICD-10-CM

## 2018-06-22 DIAGNOSIS — F330 Major depressive disorder, recurrent, mild: Secondary | ICD-10-CM | POA: Diagnosis not present

## 2018-06-22 MED ORDER — LIRAGLUTIDE -WEIGHT MANAGEMENT 18 MG/3ML ~~LOC~~ SOPN: 3.0000 mg | PEN_INJECTOR | Freq: Every day | SUBCUTANEOUS | 1 refills | Status: DC

## 2018-06-22 MED ORDER — DICLOFENAC SODIUM 1 % TD GEL: 4.0000 g | Freq: Four times a day (QID) | TRANSDERMAL | 1 refills | Status: DC

## 2018-06-22 MED ORDER — VORTIOXETINE HBR 10 MG PO TABS: 10.0000 mg | ORAL_TABLET | Freq: Every day | ORAL | 1 refills | Status: DC

## 2018-06-22 MED ORDER — HYDROCORTISONE-ACETIC ACID 1-2 % OT SOLN: 3.0000 [drp] | Freq: Two times a day (BID) | OTIC | 11 refills | Status: DC

## 2018-06-22 MED ORDER — TRIAMCINOLONE ACETONIDE 0.1 % EX CREA: 1.0000 "application " | TOPICAL_CREAM | Freq: Two times a day (BID) | CUTANEOUS | 0 refills | Status: DC

## 2018-06-22 NOTE — Progress Notes (Signed)
Subjective:    Patient ID: [name], female    DOB: 29-Dec-1960, 58 y.o.   MRN: [id number]  HPI  [name] a 58 yo female with hx of MDD who presents to the clinic to discuss worsening depression.   Pt has tried many anti-depressants over the years.  cymbalta  prozac zoloft Wellbutrin. vibryd effexor  She just always feels like she can go off them when she feels better. She feels like they have not helped that much. She has a lot going on in her life right now with school, work, Optician, dispensing, finances. She feels overwhelmed. She is having problems sleeping and concentrating. She has no motivation. No SI/HC.   Pt does think weight gain also makes her feel worse. She had to stop saxenda due to insurance not paying. She is interested in starting again with her new insurance. Increased weight causing arthritis in knees to be worse.    Pt does need refill of ear drops for itchy ears.   .. Active Ambulatory Problems    Diagnosis Date Noted  . Goiter, unspecified 12/01/2006  . THYROID NODULE 02/04/2009  . B12 DEFICIENCY 11/02/2007  . HYPERCHOLESTEROLEMIA 11/03/2005  . Major depressive disorder, recurrent episode ([place]) 11/03/2005  . VARICOSE VEINS 11/03/2005  . EXTERNAL HEMORRHOIDS WITHOUT MENTION COMP 10/24/2007  . ALLERGIC RHINITIS 04/06/2006  . HERNIA, UNILATERAL INGUINAL, W/O OBST/GNGR 05/06/2006  . Diverticulitis of colon (without mention of hemorrhage)(562.11) 11/03/2005  . ARTHRITIS, ACROMIOCLAVICULAR 05/08/2009  . KNEE PAIN, RIGHT, ACUTE 09/26/2009  . [place] DISEASE, CERVICAL 05/08/2009  . CERVICAL LYMPHADENOPATHY 10/24/2007  . BARIATRIC SURGERY STATUS 03/16/2007  . ASTHMA, WITH ACUTE EXACERBATION 02/05/2010  . Thyromegaly 06/26/2010  . Fatigue 06/26/2010  . Migraines 06/26/2010  . VULVAR CANDIDIASIS 10/28/2010  . Asthma, mild intermittent 01/01/2014  . Depression 01/01/2014  . Thyroid activity decreased 01/01/2014  . Vitamin D deficiency 01/01/2014  . Class 2 obesity due  to excess calories without serious comorbidity with body mass index (BMI) of 35.0 to 35.9 in adult 04/29/2015  . Adenomatous polyps 09/04/2015  . Primary osteoarthritis of both knees 03/25/2016  . BMI 32.0-32.9,adult 05/19/2016  . Dermatitis of left ear canal 04/08/2017  . Nausea 04/23/2017  . Drug reaction 04/23/2017  . Muscle spasm 04/23/2017  . Neck pain 04/23/2017  . Overweight (BMI 25.0-29.9) 08/10/2017   Resolved Ambulatory Problems    Diagnosis Date Noted  . Other chronic sinusitis 12/01/2006  . SHOULDER PAIN 02/04/2006  . Right upper quadrant pain 04/17/2010  . EUSTACHIAN TUBE DYSFUNCTION, RIGHT 08/31/2010  . TINNITUS, RIGHT 08/31/2010  . ANAL PRURITUS 10/28/2010  . Swollen uvula 10/08/2015   Past Medical History:  Diagnosis Date  . Asthma   . DDD (degenerative disc disease)   . Diverticulosis   . Hemorrhoids   . Hemorrhoids   . History of gastric bypass   . Thyroid disease           Review of Systems    see HPI.  Objective:   Physical Exam        Assessment & Plan:  Marland KitchenMarland KitchenJocilynn was seen today for depression.  Diagnoses and all orders for this visit:  Mild episode of recurrent major depressive disorder (HCC) -     vortioxetine HBr (TRINTELLIX) 10 MG TABS tablet; Take 1 tablet (10 mg total) by mouth daily.  Dermatitis of left ear canal -     acetic acid-hydrocortisone (VOSOL-HC) OTIC solution; Place 3 drops into both ears 2 (two) times daily.  Class 2  obesity due to excess calories without serious comorbidity with body mass index (BMI) of 35.0 to 35.9 in adult -     Liraglutide -Weight Management (SAXENDA) 18 MG/3ML SOPN; Inject 3 mg into the skin daily.  Primary osteoarthritis of both knees -     diclofenac sodium (VOLTAREN) 1 % GEL; Apply 4 g topically 4 (four) times daily. To affected joint.  Other orders -     triamcinolone cream (KENALOG) 0.1 %; Apply 1 application topically 2 (two) times daily.      .. Depression screen [facility] 2/9  06/22/2018 04/06/2017 08/18/2016 05/18/2016  Decreased Interest 1 0 0 0  Down, Depressed, Hopeless 2 0 0 0  PHQ - 2 Score 3 0 0 0  Altered sleeping 2 1 - -  Tired, decreased energy 3 2 - -  Change in appetite 3 1 - -  Feeling bad or failure about yourself  1 0 - -  Trouble concentrating 2 0 - -  Moving slowly or fidgety/restless 0 0 - -  Suicidal thoughts 0 - - -  PHQ-9 Score 14 4 - -  Difficult doing work/chores Very difficult Not difficult at all - -   .Marland Kitchen GAD 7 : Generalized Anxiety Score 06/22/2018 04/06/2017  Nervous, Anxious, on Edge 1 0  Control/stop worrying 2 1  Worry too much - different things 2 0  Trouble relaxing 1 0  Restless 0 0  Easily annoyed or irritable 2 0  Afraid - awful might happen 0 0  Total GAD 7 Score 8 1  Anxiety Difficulty Somewhat difficult -    Would like to try trintellix. Discussed side effects. Coupon card given. Follow up virtual in 4 weeks. Too busy for counseling per patient. I think could help with cognition and depression. Start with 1/2 tablet and then in 7 days increase to one full tablet.   Refilled medication. Ok to start back on saxenda she was doing great. Reminded about diet and exercise. Exercise can also help with mood.   Mentions achy knee pain. Try voltaren gel.

## 2018-06-23 ENCOUNTER — Telehealth: Payer: Self-pay

## 2018-06-23 NOTE — Telephone Encounter (Signed)
Information has been sent to insurance and waiting on a response.   

## 2018-06-23 NOTE — Telephone Encounter (Signed)
Received fax from [facility] stating PA is needed for Trintellix

## 2018-06-24 ENCOUNTER — Encounter: Payer: Self-pay | Admitting: Physician Assistant

## 2018-06-24 NOTE — Telephone Encounter (Signed)
Appro[place]on May 28 Request Reference Number: [id number]. TRINTELLIX TAB 10MG  is approved through 06/23/2019. For further questions, call [phone]. Pharmacy aware.

## 2018-06-27 ENCOUNTER — Encounter: Payer: Self-pay | Admitting: Physician Assistant

## 2019-05-15 ENCOUNTER — Other Ambulatory Visit: Payer: Self-pay | Admitting: Physician Assistant

## 2019-05-15 DIAGNOSIS — E039 Hypothyroidism, unspecified: Secondary | ICD-10-CM

## 2019-06-16 ENCOUNTER — Other Ambulatory Visit: Payer: Self-pay | Admitting: Physician Assistant

## 2019-06-16 DIAGNOSIS — E039 Hypothyroidism, unspecified: Secondary | ICD-10-CM

## 2019-06-27 ENCOUNTER — Encounter: Payer: Self-pay | Admitting: Physician Assistant

## 2019-06-27 NOTE — Telephone Encounter (Signed)
Patient scheduled tomorrow at 8:50am.

## 2019-06-27 NOTE — Telephone Encounter (Signed)
Ok to schedule virtual and overbook this week if needed.

## 2019-06-28 ENCOUNTER — Other Ambulatory Visit: Payer: Self-pay | Admitting: Physician Assistant

## 2019-06-28 ENCOUNTER — Other Ambulatory Visit: Payer: Self-pay

## 2019-06-28 ENCOUNTER — Telehealth (INDEPENDENT_AMBULATORY_CARE_PROVIDER_SITE_OTHER): Payer: 59 | Admitting: Physician Assistant

## 2019-06-28 ENCOUNTER — Encounter: Payer: Self-pay | Admitting: Physician Assistant

## 2019-06-28 VITALS — BP 123/66 | HR 72 | Temp 97.8°F | Ht 67.0 in | Wt 210.0 lb

## 2019-06-28 DIAGNOSIS — Z13220 Encounter for screening for lipoid disorders: Secondary | ICD-10-CM | POA: Diagnosis not present

## 2019-06-28 DIAGNOSIS — Z1231 Encounter for screening mammogram for malignant neoplasm of breast: Secondary | ICD-10-CM

## 2019-06-28 DIAGNOSIS — Z6832 Body mass index (BMI) 32.0-32.9, adult: Secondary | ICD-10-CM | POA: Diagnosis not present

## 2019-06-28 DIAGNOSIS — F330 Major depressive disorder, recurrent, mild: Secondary | ICD-10-CM

## 2019-06-28 DIAGNOSIS — Z131 Encounter for screening for diabetes mellitus: Secondary | ICD-10-CM | POA: Diagnosis not present

## 2019-06-28 DIAGNOSIS — J4520 Mild intermittent asthma, uncomplicated: Secondary | ICD-10-CM | POA: Diagnosis not present

## 2019-06-28 DIAGNOSIS — E039 Hypothyroidism, unspecified: Secondary | ICD-10-CM | POA: Diagnosis not present

## 2019-06-28 DIAGNOSIS — E6609 Other obesity due to excess calories: Secondary | ICD-10-CM | POA: Diagnosis not present

## 2019-06-28 MED ORDER — PROAIR RESPICLICK 108 (90 BASE) MCG/ACT IN AEPB: 2.0000 | INHALATION_SPRAY | RESPIRATORY_TRACT | 2 refills | Status: DC

## 2019-06-28 MED ORDER — NOVOFINE 32G X 6 MM MISC: 1 refills | Status: DC

## 2019-06-28 MED ORDER — SAXENDA 18 MG/3ML ~~LOC~~ SOPN: 3.0000 mg | PEN_INJECTOR | Freq: Every day | SUBCUTANEOUS | 1 refills | Status: DC

## 2019-06-28 NOTE — Progress Notes (Signed)
Patient ID: [name], female   DOB: 1960/10/13, 59 y.o.   MRN: [id number] .Marland KitchenVirtual Visit via Video Note  I connected with [name] on 06/28/19 at  8:50 AM EDT by a video enabled telemedicine application and verified that I am speaking with the correct person using two identifiers.  Location: Patient: home Provider: clinic   I discussed the limitations of evaluation and management by telemedicine and the availability of in person appointments. The patient expressed understanding and agreed to proceed.  History of Present Illness: Patient is a 59 year old obese female with hypothyroidism and asthma who calls into the clinic to restart Saxenda for weight loss.  Patient was previously on Saxenda and had lost down to 180 pounds.  She had to go off of it due to insurance reasons.  She would like to restart today.  She has gained 30 pounds.  She is trying to stay active but is not regularly exercising.  She knows she needs help with her portion sizes.  Patient is doing well with mood.  She denies any anxiety or depression.  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  She is looking to make a career change.  She is hoping to get into teaching.  Asthma controlled but does use albuterol inhaler intermittently. She needs refill.  .. Active Ambulatory Problems    Diagnosis Date Noted  . Goiter, unspecified 12/01/2006  . THYROID NODULE 02/04/2009  . B12 DEFICIENCY 11/02/2007  . HYPERCHOLESTEROLEMIA 11/03/2005  . Major depressive disorder, recurrent episode ([place]) 11/03/2005  . VARICOSE VEINS 11/03/2005  . EXTERNAL HEMORRHOIDS WITHOUT MENTION COMP 10/24/2007  . ALLERGIC RHINITIS 04/06/2006  . HERNIA, UNILATERAL INGUINAL, W/O OBST/GNGR 05/06/2006  . Diverticulitis of colon (without mention of hemorrhage)(562.11) 11/03/2005  . ARTHRITIS, ACROMIOCLAVICULAR 05/08/2009  . KNEE PAIN, RIGHT, ACUTE 09/26/2009  . [place] DISEASE, CERVICAL 05/08/2009  . BARIATRIC SURGERY STATUS 03/16/2007  .  ASTHMA, WITH ACUTE EXACERBATION 02/05/2010  . Thyromegaly 06/26/2010  . Fatigue 06/26/2010  . Migraines 06/26/2010  . Asthma, mild intermittent 01/01/2014  . Depression 01/01/2014  . Thyroid activity decreased 01/01/2014  . Vitamin D deficiency 01/01/2014  . Class 2 obesity due to excess calories without serious comorbidity with body mass index (BMI) of 35.0 to 35.9 in adult 04/29/2015  . Adenomatous polyps 09/04/2015  . Primary osteoarthritis of both knees 03/25/2016  . BMI 32.0-32.9,adult 05/19/2016  . Dermatitis of left ear canal 04/08/2017  . Nausea 04/23/2017  . Drug reaction 04/23/2017  . Muscle spasm 04/23/2017  . Neck pain 04/23/2017  . Overweight (BMI 25.0-29.9) 08/10/2017   Resolved Ambulatory Problems    Diagnosis Date Noted  . Other chronic sinusitis 12/01/2006  . SHOULDER PAIN 02/04/2006  . CERVICAL LYMPHADENOPATHY 10/24/2007  . Right upper quadrant pain 04/17/2010  . VULVAR CANDIDIASIS 10/28/2010  . EUSTACHIAN TUBE DYSFUNCTION, RIGHT 08/31/2010  . TINNITUS, RIGHT 08/31/2010  . ANAL PRURITUS 10/28/2010  . Swollen uvula 10/08/2015   Past Medical History:  Diagnosis Date  . Asthma   . DDD (degenerative disc disease)   . Diverticulosis   . Hemorrhoids   . Hemorrhoids   . History of gastric bypass   . Thyroid disease    Reviewed med, allergy, problem list.     Observations/Objective:  No acute distress. Normal appearance and mood. No abnormal breathing.  .. Today's Vitals   06/28/19 0817  BP: 123/66  Pulse: 72  Temp: 97.8 F (36.6 C)  TempSrc: Oral  Weight: 210 lb (95.3 kg)  Height: 5'  7" (1.702 m)   Body mass index is 32.89 kg/m.   Marland Kitchen Depression screen [facility] 2/9 06/28/2019 06/22/2018 04/06/2017 08/18/2016 05/18/2016  Decreased Interest 0 1 0 0 0  Down, Depressed, Hopeless 0 2 0 0 0  PHQ - 2 Score 0 3 0 0 0  Altered sleeping 1 2 1  - -  Tired, decreased energy 3 3 2  - -  Change in appetite 0 3 1 - -  Feeling bad or failure about yourself  0 1 0 -  -  Trouble concentrating 0 2 0 - -  Moving slowly or fidgety/restless 0 0 0 - -  Suicidal thoughts 0 0 - - -  PHQ-9 Score 4 14 4  - -  Difficult doing work/chores Somewhat difficult Very difficult Not difficult at all - -   .Marland Kitchen GAD 7 : Generalized Anxiety Score 06/28/2019 06/22/2018 04/06/2017  Nervous, Anxious, on Edge 0 1 0  Control/stop worrying 0 2 1  Worry too much - different things 0 2 0  Trouble relaxing 0 1 0  Restless 0 0 0  Easily annoyed or irritable 0 2 0  Afraid - awful might happen 0 0 0  Total GAD 7 Score 0 8 1  Anxiety Difficulty Not difficult at all Somewhat difficult -     Assessment and Plan: Marland KitchenMarland KitchenBreleigh was seen today for obesity.  Diagnoses and all orders for this visit:  Class 1 obesity due to excess calories without serious comorbidity with body mass index (BMI) of 32.0 to 32.9 in adult -     Insulin Pen Needle (NOVOFINE) 32G X 6 MM MISC; USE AS DIRECETED WITH SAXENDA -     Liraglutide -Weight Management (SAXENDA) 18 MG/3ML SOPN; Inject 0.5 mLs (3 mg total) into the skin daily.  Encounter for screening mammogram for malignant neoplasm of breast -     MM 3D SCREEN BREAST BILATERAL  Mild intermittent asthma, unspecified whether complicated -     Albuterol Sulfate (PROAIR RESPICLICK) 123XX123 (90 Base) MCG/ACT AEPB; Inhale 2 puffs into the lungs every 4 (four) hours.  Acquired hypothyroidism  Mild episode of recurrent major depressive disorder [facility])   Patient did very well with Saxenda in the past.  I would like to restart it with new insurance.  The goal would be to get back down to her previous weight of 180.  Discussed regular exercise at least 150 minutes a week.  Encourage portion size control.  Patient is aware of side effects.  Follow-up in 3 months.  Patient is due for a Pap and mammogram.  Mammogram was ordered today.  Pap she will need to reschedule in office.  Refilled albuterol inhaler today to use as needed for her intermittent asthma.  She is on no  preventative.  She is in need of fasting labs.  Will order these and have been sent downstairs to have drawn in the morning.  She does need to follow-up on her thyroid levels to get medication refills.   Follow Up Instructions:    I discussed the assessment and treatment plan with the patient. The patient was provided an opportunity to ask questions and all were answered. The patient agreed with the plan and demonstrated an understanding of the instructions.   The patient was advised to call back or seek an in-person evaluation if the symptoms worsen or if the condition fails to improve as anticipated.  I provided 20 minutes of non-face-to-face time during this encounter.   Iran Planas, PA-C

## 2019-06-28 NOTE — Progress Notes (Signed)
Patient wants to discuss restarting Saxenda No other issues PHQ9-GAD7 completed.  Okay with ordering mammogram Needs to schedule pap with [place] Had Covid vaccine

## 2019-06-29 ENCOUNTER — Ambulatory Visit (INDEPENDENT_AMBULATORY_CARE_PROVIDER_SITE_OTHER): Payer: 59

## 2019-06-29 DIAGNOSIS — Z1231 Encounter for screening mammogram for malignant neoplasm of breast: Secondary | ICD-10-CM | POA: Diagnosis not present

## 2019-06-30 NOTE — Progress Notes (Signed)
[name],   Normal mammogram. Follow up in 1 year.

## 2019-07-04 ENCOUNTER — Telehealth: Payer: Self-pay | Admitting: Physician Assistant

## 2019-07-04 NOTE — Telephone Encounter (Signed)
Received fax for PA on Saxenda sent through cover my meds waiting on determination. - CF 

## 2019-07-05 ENCOUNTER — Telehealth: Payer: No Typology Code available for payment source | Admitting: Physician Assistant

## 2019-07-05 ENCOUNTER — Encounter: Payer: Self-pay | Admitting: Physician Assistant

## 2019-07-05 DIAGNOSIS — H60542 Acute eczematoid otitis externa, left ear: Secondary | ICD-10-CM

## 2019-07-05 MED ORDER — HYDROCORTISONE-ACETIC ACID 1-2 % OT SOLN: 3.0000 [drp] | Freq: Two times a day (BID) | OTIC | 5 refills | Status: DC

## 2019-07-07 NOTE — Telephone Encounter (Signed)
Received fax from [place] and they approved coverage on Saxenda.   Valid: 07/06/2019 - 11/04/2019 PA Reference : [id number] - CF

## 2019-07-12 ENCOUNTER — Encounter: Payer: Self-pay | Admitting: Physician Assistant

## 2019-07-12 ENCOUNTER — Other Ambulatory Visit: Payer: Self-pay | Admitting: Physician Assistant

## 2019-07-12 MED ORDER — ONDANSETRON HCL 8 MG PO TABS
8.0000 mg | ORAL_TABLET | Freq: Three times a day (TID) | ORAL | 0 refills | Status: DC | PRN
Start: 2019-07-12 — End: 2020-02-08

## 2019-07-12 MED FILL — ONDANSETRON HCL 8 MG TABLET: 8 | 7 days supply | Qty: 20 | Fill #0

## 2019-07-12 NOTE — Progress Notes (Signed)
zofran for nausea to start saxenda.

## 2019-07-14 ENCOUNTER — Other Ambulatory Visit: Payer: Self-pay | Admitting: Physician Assistant

## 2019-07-14 MED ORDER — VALACYCLOVIR HCL 1 G PO TABS: 1000.0000 mg | ORAL_TABLET | Freq: Two times a day (BID) | ORAL | 0 refills | Status: DC

## 2019-07-14 MED FILL — valACYclovir HCL 1 GM TABS: 1 | 10 days supply | Qty: 20 | Fill #0

## 2019-07-18 DIAGNOSIS — Z131 Encounter for screening for diabetes mellitus: Secondary | ICD-10-CM | POA: Diagnosis not present

## 2019-07-18 DIAGNOSIS — E039 Hypothyroidism, unspecified: Secondary | ICD-10-CM | POA: Diagnosis not present

## 2019-07-18 DIAGNOSIS — E6609 Other obesity due to excess calories: Secondary | ICD-10-CM | POA: Diagnosis not present

## 2019-07-18 DIAGNOSIS — Z13220 Encounter for screening for lipoid disorders: Secondary | ICD-10-CM | POA: Diagnosis not present

## 2019-07-18 DIAGNOSIS — Z6832 Body mass index (BMI) 32.0-32.9, adult: Secondary | ICD-10-CM | POA: Diagnosis not present

## 2019-07-19 ENCOUNTER — Other Ambulatory Visit: Payer: Self-pay | Admitting: Physician Assistant

## 2019-07-19 DIAGNOSIS — E039 Hypothyroidism, unspecified: Secondary | ICD-10-CM

## 2019-07-19 LAB — LIPID PANEL W/REFLEX DIRECT LDL
Cholesterol: 223 mg/dL — ABNORMAL HIGH (ref <200)
HDL: 67 mg/dL (ref >=50)
LDL Cholesterol (Calc): 137 mg/dL (calc) — ABNORMAL HIGH
Non-HDL Cholesterol (Calc): 156 mg/dL (calc) — ABNORMAL HIGH (ref <130)
Total CHOL/HDL Ratio: 3.3 (calc) (ref <5.0)
Triglycerides: 88 mg/dL (ref <150)

## 2019-07-19 LAB — COMPLETE METABOLIC PANEL WITH GFR
AG Ratio: 1.8 (calc) (ref 1.0–2.5)
ALT: 4 U/L — ABNORMAL LOW (ref 6–29)
AST: 14 U/L (ref 10–35)
Albumin: 4.2 g/dL (ref 3.6–5.1)
Alkaline phosphatase (APISO): 93 U/L (ref 37–153)
BUN: 9 mg/dL (ref 7–25)
CO2: 27 mmol/L (ref 20–32)
Calcium: 9.2 mg/dL (ref 8.6–10.4)
Chloride: 104 mmol/L (ref 98–110)
Creat: 0.68 mg/dL (ref 0.50–1.05)
GFR, Est African American: 112 mL/min/{1.73_m2} (ref >=60)
GFR, Est Non African American: 96 mL/min/{1.73_m2} (ref >=60)
Globulin: 2.4 g/dL (calc) (ref 1.9–3.7)
Glucose, Bld: 83 mg/dL (ref 65–99)
Potassium: 4.2 mmol/L (ref 3.5–5.3)
Sodium: 139 mmol/L (ref 135–146)
Total Bilirubin: 0.4 mg/dL (ref 0.2–1.2)
Total Protein: 6.6 g/dL (ref 6.1–8.1)

## 2019-07-19 LAB — TSH: TSH: 0.81 mIU/L (ref 0.40–4.50)

## 2019-07-19 MED ORDER — LEVOTHYROXINE SODIUM 88 MCG PO TABS: 88.0000 ug | ORAL_TABLET | Freq: Every day | ORAL | 3 refills | Status: DC

## 2019-07-19 MED FILL — LEVOTHYROXINE SODIUM 88 MCG: 88 | 90 days supply | Qty: 90 | Fill #0

## 2019-07-19 NOTE — Progress Notes (Signed)
[name],   Total cholesterol is down some. LDL is 137. Optimal is under 100. 10 year CV risk is 2.3 percent and still low. Continue to follow healthy lifestyles and we will monitor.  Kidney, liver, glucose look great.  Thyroid is great.

## 2019-08-01 ENCOUNTER — Other Ambulatory Visit: Payer: Self-pay

## 2019-08-01 ENCOUNTER — Ambulatory Visit (INDEPENDENT_AMBULATORY_CARE_PROVIDER_SITE_OTHER): Payer: 59 | Admitting: Physician Assistant

## 2019-08-01 ENCOUNTER — Ambulatory Visit (INDEPENDENT_AMBULATORY_CARE_PROVIDER_SITE_OTHER): Payer: 59

## 2019-08-01 ENCOUNTER — Other Ambulatory Visit (HOSPITAL_COMMUNITY)
Admission: RE | Admit: 2019-08-01 | Discharge: 2019-08-01 | Disposition: A | Discharge status: Home / Self Care | Payer: 59 | Source: Ambulatory Visit | Attending: Physician Assistant | Admitting: Physician Assistant

## 2019-08-01 VITALS — BP 127/62 | HR 81 | Ht 67.0 in | Wt 211.0 lb

## 2019-08-01 DIAGNOSIS — Z124 Encounter for screening for malignant neoplasm of cervix: Secondary | ICD-10-CM | POA: Insufficient documentation

## 2019-08-01 DIAGNOSIS — M25562 Pain in left knee: Secondary | ICD-10-CM

## 2019-08-01 DIAGNOSIS — M25561 Pain in right knee: Secondary | ICD-10-CM

## 2019-08-01 DIAGNOSIS — G8929 Other chronic pain: Secondary | ICD-10-CM | POA: Diagnosis not present

## 2019-08-01 DIAGNOSIS — S92525A Nondisplaced fracture of medial phalanx of left lesser toe(s), initial encounter for closed fracture: Secondary | ICD-10-CM | POA: Diagnosis not present

## 2019-08-01 DIAGNOSIS — M170 Bilateral primary osteoarthritis of knee: Secondary | ICD-10-CM | POA: Diagnosis not present

## 2019-08-01 DIAGNOSIS — M25761 Osteophyte, right knee: Secondary | ICD-10-CM | POA: Diagnosis not present

## 2019-08-01 DIAGNOSIS — S99922A Unspecified injury of left foot, initial encounter: Secondary | ICD-10-CM

## 2019-08-01 DIAGNOSIS — M1711 Unilateral primary osteoarthritis, right knee: Secondary | ICD-10-CM | POA: Diagnosis not present

## 2019-08-01 MED ORDER — MELOXICAM 15 MG PO TABS
15.0000 mg | ORAL_TABLET | Freq: Every day | ORAL | 3 refills | Status: DC
Start: 2019-08-01 — End: 2019-11-01

## 2019-08-01 MED FILL — MELOXICAM 15 MG TABLET: 15 | 30 days supply | Qty: 30 | Fill #0

## 2019-08-01 NOTE — Progress Notes (Signed)
Subjective:     [name] is a 59 y.o. woman who comes in today for a  pap smear only. Her most recent annual exam was on 06/28/2019. Her most recent Pap smear was on 04/29/2015 and showed no abnormalities. Previous abnormal Pap smears: yes - HPV with some abnormal cells with cone procedure. Contraception: hysterectomy.   2 days ago she hit 2nd left toe on a table. It is bruised and very tender. Not done anything to make better.   [name] also having some right knee pain. She has constant bilateral knee pain for years but over the last month right knee pain has been worse. It seems to be more medial right knee pain and catching. It is preventing her from being as active as she would want to be.     The following portions of the patient's history were reviewed and updated as appropriate: allergies, current medications, past family history, past medical history, past social history, past surgical history and problem list.  Review of Systems Pertinent items are noted in HPI.   Objective:    BP 127/62   Pulse 81   Ht 5\' 7"  (1.702 m)   Wt 211 lb (95.7 kg)   SpO2 97%   BMI 33.05 kg/m  Pelvic Exam: external genitalia normal, uterus surgically absent and vagina normal without discharge. Pap smear obtained Right knee pain with Mcmurrays. No laxity. Tenderness over medial right knee to palpation. Fluid wave noted.   Assessment:    Screening pap smear.   Plan:    pt has had complete hysterectomy if normal no need for future paps.   Will get xrays of knees and left toe.   Discussed buddy taping and ice for toe.  Likely some bilateral knee arthritis.xrays ordered.  mobic given. Certainly could benefit from injections. Follow up with sports medicine. With the new catching may need to consider MRI.   Addendum: Middle toe fracture:buddy tape/Ice and good supportive show for next 4-6 weeks.  Arthritis in both knees: right osteophytes. I think trial of knee injections with [name] could help.  If not improving MRI.   Marland Kitchen

## 2019-08-02 LAB — CYTOLOGY - PAP
Comment: NEGATIVE
Diagnosis: NEGATIVE
High risk HPV: NEGATIVE

## 2019-08-02 NOTE — Progress Notes (Signed)
Arthritis and degenerative changes in the medial compartment where you are having the pain. Does not completely rule out meniscal involvement as well. I would suggest appt with Dr. Darene Lamer to consider injection and then if not improving consider MRI.

## 2019-08-02 NOTE — Progress Notes (Signed)
Sandi,   Negative for HPV and normal cells. No more paps needed.

## 2019-08-02 NOTE — Progress Notes (Signed)
Left knee showed some medial bone osteophytes(spurs).

## 2019-08-02 NOTE — Progress Notes (Signed)
[name],   Nondisplaced fracture at base of middle phalanx of second toe. Buddy tape until pain resolves usually 4-6 weeks. Ice regularly.

## 2019-08-07 ENCOUNTER — Ambulatory Visit: Payer: 59 | Admitting: Sports Medicine

## 2019-08-07 ENCOUNTER — Encounter: Payer: Self-pay | Admitting: Physician Assistant

## 2019-08-07 DIAGNOSIS — M1710 Unilateral primary osteoarthritis, unspecified knee: Secondary | ICD-10-CM | POA: Insufficient documentation

## 2019-08-07 DIAGNOSIS — S99922A Unspecified injury of left foot, initial encounter: Secondary | ICD-10-CM | POA: Insufficient documentation

## 2019-08-07 DIAGNOSIS — G8929 Other chronic pain: Secondary | ICD-10-CM | POA: Insufficient documentation

## 2019-08-11 ENCOUNTER — Other Ambulatory Visit: Payer: Self-pay

## 2019-08-11 ENCOUNTER — Ambulatory Visit: Payer: 59 | Admitting: Sports Medicine

## 2019-08-11 DIAGNOSIS — M170 Bilateral primary osteoarthritis of knee: Secondary | ICD-10-CM

## 2019-08-11 NOTE — Progress Notes (Signed)
    Procedures performed today:    Procedure: Real-time Ultrasound Guided injection of the right knee Device: Samsung HS60  Verbal informed consent obtained.  Time-out conducted.  Noted no overlying erythema, induration, or other signs of local infection.  Skin prepped in a sterile fashion.  Local anesthesia: Topical Ethyl chloride.  With sterile technique and under real time ultrasound guidance: 1 cc Kenalog 40, 2 cc lidocaine, 2 cc bupivacaine injected easily Completed without difficulty  Pain immediately resolved suggesting accurate placement of the medication.  Advised to call if fevers/chills, erythema, induration, drainage, or persistent bleeding.  Images permanently stored and available for review in the ultrasound unit.  Impression: Technically successful ultrasound guided injection.  Independent interpretation of notes and tests performed by another provider:   None.  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 Today we injected her knee, rehab exercises given, return to see me in 1 month. Viscosupplementation if no better.    ___________________________________________ Gwen Her. Dianah Field, M.D., ABFM., CAQSM. Primary Care and [place] Instructor of [place] of [facility] of Medicine

## 2019-08-11 NOTE — Assessment & Plan Note (Signed)
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 Today we injected her knee, rehab exercises given, return to see me in 1 month. Viscosupplementation if no better.

## 2019-09-12 ENCOUNTER — Other Ambulatory Visit: Payer: Self-pay

## 2019-09-12 ENCOUNTER — Encounter: Payer: Self-pay | Admitting: Sports Medicine

## 2019-09-12 ENCOUNTER — Ambulatory Visit (INDEPENDENT_AMBULATORY_CARE_PROVIDER_SITE_OTHER): Payer: 59 | Admitting: Sports Medicine

## 2019-09-12 ENCOUNTER — Ambulatory Visit (INDEPENDENT_AMBULATORY_CARE_PROVIDER_SITE_OTHER): Payer: 59

## 2019-09-12 DIAGNOSIS — M170 Bilateral primary osteoarthritis of knee: Secondary | ICD-10-CM | POA: Diagnosis not present

## 2019-09-12 DIAGNOSIS — M25561 Pain in right knee: Secondary | ICD-10-CM | POA: Diagnosis not present

## 2019-09-12 MED ORDER — HYDROCODONE-ACETAMINOPHEN 5-325 MG PO TABS: 1.0000 | ORAL_TABLET | Freq: Three times a day (TID) | ORAL | 0 refills | Status: DC | PRN

## 2019-09-12 MED FILL — HYDROCODON-APAP 5-325: 5-325 | 5 days supply | Qty: 15 | Fill #0

## 2019-09-12 MED FILL — SAXENDA 18 MG/3 ML PEN: 18 | 30 days supply | Qty: 15 | Fill #1

## 2019-09-12 NOTE — Progress Notes (Signed)
    Procedures performed today:    None.  Independent interpretation of notes and tests performed by another provider:   None.  Brief History, Exam, Impression, and Recommendations:    [name] is a pleasant 59yo female who presents today for f/u regarding right knee pain. The pain did not improve with injection at her last visit a month ago. The pain has been consistentyl worsening since last visit especially after moving last week. The pain is at the medial joint line. She reports extreme pain on twisting. She has a positive mcmurray and thessaly sign. This is highly suspicious of a meniscal tear. We are going to get an MRI of her knee today and prescribe hydrocodone for pain relief. We have discussed the potential for arthroscopy being needed depending on the extent of injury.   Marcelino Duster, MS3   ___________________________________________ Gwen Her. Dianah Field, M.D., ABFM., CAQSM. Primary Care and [place] Instructor of [place] of [facility] of Medicine

## 2019-09-12 NOTE — Assessment & Plan Note (Signed)
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 She does have positive McMurray's, Thessaly signs, pain with terminal flexion, pain at the medial joint line. She also has significant mechanical symptoms, due to the mechanical symptoms including locking this is a surgical urgency. I am going to add hydrocodone for pain relief and we will get her an MRI right now. I do suspect we will be sending her to one of our surgeons for arthroscopy.

## 2019-09-15 ENCOUNTER — Encounter: Payer: Self-pay | Admitting: Physician Assistant

## 2019-09-15 DIAGNOSIS — M170 Bilateral primary osteoarthritis of knee: Secondary | ICD-10-CM

## 2019-09-15 MED ORDER — CYCLOBENZAPRINE HCL 10 MG PO TABS
10.0000 mg | ORAL_TABLET | Freq: Three times a day (TID) | ORAL | 0 refills | Status: DC | PRN
Start: 2019-09-15 — End: 2020-05-14

## 2019-09-15 MED FILL — CYCLOBENZAPRINE HCL 10 MG T: 10 | 10 days supply | Qty: 30 | Fill #0

## 2019-09-16 NOTE — Addendum Note (Signed)
Addended by: Silverio Decamp on: 09/16/2019 11:51 AM   Modules accepted: Orders

## 2019-09-19 DIAGNOSIS — M25561 Pain in right knee: Secondary | ICD-10-CM | POA: Insufficient documentation

## 2019-09-20 ENCOUNTER — Ambulatory Visit: Payer: 59 | Admitting: Sports Medicine

## 2019-09-21 DIAGNOSIS — M25561 Pain in right knee: Secondary | ICD-10-CM | POA: Diagnosis not present

## 2019-09-22 ENCOUNTER — Encounter: Payer: Self-pay | Admitting: Physician Assistant

## 2019-09-22 MED ORDER — IBUPROFEN 800 MG PO TABS
800.0000 mg | ORAL_TABLET | Freq: Three times a day (TID) | ORAL | 2 refills | Status: DC | PRN
Start: 2019-09-22 — End: 2020-05-14

## 2019-09-22 MED FILL — IBUPROFEN 800 MG TAB: 800 | 30 days supply | Qty: 90 | Fill #0

## 2019-09-23 ENCOUNTER — Ambulatory Visit: Payer: 59 | Attending: Internal Medicine

## 2019-09-23 ENCOUNTER — Other Ambulatory Visit: Payer: Self-pay

## 2019-09-23 DIAGNOSIS — M170 Bilateral primary osteoarthritis of knee: Secondary | ICD-10-CM

## 2019-09-23 DIAGNOSIS — Z23 Encounter for immunization: Secondary | ICD-10-CM

## 2019-09-23 NOTE — Progress Notes (Signed)
° °  Covid-19 Vaccination Clinic  Name:  ERMELINDA ECKERT    MRN: [id number] DOB: 1960-11-29  09/23/2019  Ms. Habeeb was observed post Covid-19 immunization for 15 minutes without incident. She was provided with Vaccine Information Sheet and instruction to access the V-Safe system.   Ms. Deeg was instructed to call 911 with any severe reactions post vaccine:  Difficulty breathing   Swelling of face and throat   A fast heartbeat   A bad rash all over body   Dizziness and weakness

## 2019-09-26 ENCOUNTER — Other Ambulatory Visit: Payer: Self-pay | Admitting: Sports Medicine

## 2019-09-26 DIAGNOSIS — M170 Bilateral primary osteoarthritis of knee: Secondary | ICD-10-CM

## 2019-10-04 ENCOUNTER — Encounter: Payer: Self-pay | Admitting: Physician Assistant

## 2019-10-17 DIAGNOSIS — M1711 Unilateral primary osteoarthritis, right knee: Secondary | ICD-10-CM | POA: Diagnosis not present

## 2019-10-30 ENCOUNTER — Other Ambulatory Visit: Payer: Self-pay | Admitting: Physician Assistant

## 2019-10-30 DIAGNOSIS — E6609 Other obesity due to excess calories: Secondary | ICD-10-CM

## 2019-10-30 MED FILL — LEVOTHYROXINE SODIUM 88 MCG: 88 | 90 days supply | Qty: 90 | Fill #1

## 2019-10-30 MED FILL — SAXENDA 18 MG/3 ML PEN: 18 | 30 days supply | Qty: 15 | Fill #0

## 2019-11-01 ENCOUNTER — Encounter: Payer: Self-pay | Admitting: Physician Assistant

## 2019-11-01 ENCOUNTER — Other Ambulatory Visit: Payer: Self-pay

## 2019-11-01 ENCOUNTER — Ambulatory Visit: Payer: 59 | Admitting: Physician Assistant

## 2019-11-01 ENCOUNTER — Ambulatory Visit (INDEPENDENT_AMBULATORY_CARE_PROVIDER_SITE_OTHER): Payer: 59 | Admitting: Physician Assistant

## 2019-11-01 VITALS — BP 123/70 | HR 83 | Ht 67.0 in | Wt 199.0 lb

## 2019-11-01 DIAGNOSIS — Z6832 Body mass index (BMI) 32.0-32.9, adult: Secondary | ICD-10-CM

## 2019-11-01 DIAGNOSIS — Z01818 Encounter for other preprocedural examination: Secondary | ICD-10-CM | POA: Diagnosis not present

## 2019-11-01 DIAGNOSIS — E6609 Other obesity due to excess calories: Secondary | ICD-10-CM | POA: Diagnosis not present

## 2019-11-01 MED ORDER — SAXENDA 18 MG/3ML ~~LOC~~ SOPN: 3.0000 mg | PEN_INJECTOR | Freq: Every day | SUBCUTANEOUS | 1 refills | Status: DC

## 2019-11-01 NOTE — Progress Notes (Signed)
Subjective:    Patient ID: [name], female    DOB: 05-23-1960, 59 y.o.   MRN: [id number]  HPI  Patient is a 59 year old female who presents to the clinic for surgical clearance for her partial right knee replacement on 11/13/2019.  Pt has temporarily stopped saxenda for surgery but plans to go back on it. Lost 12lbs. Doing great.   .. Active Ambulatory Problems    Diagnosis Date Noted  . Goiter, unspecified 12/01/2006  . THYROID NODULE 02/04/2009  . B12 DEFICIENCY 11/02/2007  . HYPERCHOLESTEROLEMIA 11/03/2005  . Major depressive disorder, recurrent episode ([place]) 11/03/2005  . VARICOSE VEINS 11/03/2005  . EXTERNAL HEMORRHOIDS WITHOUT MENTION COMP 10/24/2007  . ALLERGIC RHINITIS 04/06/2006  . HERNIA, UNILATERAL INGUINAL, W/O OBST/GNGR 05/06/2006  . Diverticulitis of colon (without mention of hemorrhage)(562.11) 11/03/2005  . ARTHRITIS, ACROMIOCLAVICULAR 05/08/2009  . Acute pain of right knee 09/26/2009  . [place] DISEASE, CERVICAL 05/08/2009  . BARIATRIC SURGERY STATUS 03/16/2007  . ASTHMA, WITH ACUTE EXACERBATION 02/05/2010  . Thyromegaly 06/26/2010  . Fatigue 06/26/2010  . Migraines 06/26/2010  . Asthma, mild intermittent 01/01/2014  . Depression 01/01/2014  . Thyroid activity decreased 01/01/2014  . Vitamin D deficiency 01/01/2014  . Class 2 obesity due to excess calories without serious comorbidity with body mass index (BMI) of 35.0 to 35.9 in adult 04/29/2015  . Adenomatous polyps 09/04/2015  . Primary osteoarthritis of both knees 03/25/2016  . BMI 32.0-32.9,adult 05/19/2016  . Dermatitis of left ear canal 04/08/2017  . Nausea 04/23/2017  . Drug reaction 04/23/2017  . Muscle spasm 04/23/2017  . Neck pain 04/23/2017  . Overweight (BMI 25.0-29.9) 08/10/2017  . Injury of left toe 08/07/2019  . Bilateral chronic knee pain 08/07/2019   Resolved Ambulatory Problems    Diagnosis Date Noted  . Other chronic sinusitis 12/01/2006  . SHOULDER PAIN 02/04/2006  .  CERVICAL LYMPHADENOPATHY 10/24/2007  . Right upper quadrant pain 04/17/2010  . VULVAR CANDIDIASIS 10/28/2010  . EUSTACHIAN TUBE DYSFUNCTION, RIGHT 08/31/2010  . TINNITUS, RIGHT 08/31/2010  . ANAL PRURITUS 10/28/2010  . Swollen uvula 10/08/2015  . Primary osteoarthritis of knee 08/07/2019   Past Medical History:  Diagnosis Date  . Asthma   . DDD (degenerative disc disease)   . Diverticulosis   . Hemorrhoids   . Hemorrhoids   . History of gastric bypass   . Thyroid disease      Review of Systems  All other systems reviewed and are negative.      Objective:   Physical Exam Vitals reviewed.  Constitutional:      Appearance: Normal appearance. She is obese.  HENT:     Head: Normocephalic.  Cardiovascular:     Rate and Rhythm: Normal rate and regular rhythm.     Pulses: Normal pulses.     Heart sounds: Normal heart sounds.  Pulmonary:     Effort: Pulmonary effort is normal.     Breath sounds: Normal breath sounds.  Musculoskeletal:     Right lower leg: No edema.     Left lower leg: No edema.  Neurological:     General: No focal deficit present.     Mental Status: She is alert and oriented to person, place, and time.  Psychiatric:        Mood and Affect: Mood normal.        Behavior: Behavior normal.           Assessment & Plan:  Marland KitchenMarland KitchenAmalia was seen today for pre-op exam and follow-up.  Diagnoses and all orders for this visit:  Preoperative clearance -     CBC -     BASIC METABOLIC PANEL WITH GFR -     Albumin -     Protime-INR -     Hemoglobin A1c -     EKG 12-Lead  Class 1 obesity due to excess calories without serious comorbidity with body mass index (BMI) of 32.0 to 32.9 in adult -     Liraglutide -Weight Management (SAXENDA) 18 MG/3ML SOPN; Inject 3 mg into the skin daily.   Labs ordered for preop. EKG- NSR, no arrhthymias, no acute changes.   Paperwork signed for surgical clearance pending no lab abnormality.   Refilled saxenda.  Continue  with diet and exercise.  Follow up in 6 months.

## 2019-11-07 ENCOUNTER — Other Ambulatory Visit: Payer: 59

## 2019-11-07 DIAGNOSIS — Z20822 Contact with and (suspected) exposure to covid-19: Secondary | ICD-10-CM

## 2019-11-08 ENCOUNTER — Encounter: Payer: Self-pay | Admitting: Physician Assistant

## 2019-11-08 ENCOUNTER — Other Ambulatory Visit (HOSPITAL_BASED_OUTPATIENT_CLINIC_OR_DEPARTMENT_OTHER): Payer: Self-pay | Admitting: Orthopedic Surgery

## 2019-11-08 ENCOUNTER — Other Ambulatory Visit: Payer: 59

## 2019-11-08 LAB — SARS-COV-2, NAA 2 DAY TAT

## 2019-11-08 LAB — NOVEL CORONAVIRUS, NAA: SARS-CoV-2, NAA: NOT DETECTED

## 2019-11-08 MED FILL — ASPIRIN CHILD 81 MG TAB CHE: 81 | 36 days supply | Qty: 72 | Fill #0

## 2019-11-08 MED FILL — DOCUSATE NA 100 MG SOFTGEL: 100 | 50 days supply | Qty: 100 | Fill #0

## 2019-11-08 MED FILL — CELECOXIB 200 MG CAP: 200 | 30 days supply | Qty: 60 | Fill #0

## 2019-11-08 MED FILL — FERROUS SULFATE 325 MG TAB: 325 (65 FE) | 33 days supply | Qty: 100 | Fill #0

## 2019-11-08 MED FILL — METHOCARBAMOL 500 MG TABS: 500 | 12 days supply | Qty: 50 | Fill #0

## 2019-11-08 MED FILL — HYDROCODON-APAP 7.5-325: 7.5-325 | 5 days supply | Qty: 60 | Fill #0

## 2019-11-09 ENCOUNTER — Other Ambulatory Visit: Payer: 59

## 2019-11-13 DIAGNOSIS — M1711 Unilateral primary osteoarthritis, right knee: Secondary | ICD-10-CM | POA: Diagnosis not present

## 2019-11-13 DIAGNOSIS — G8918 Other acute postprocedural pain: Secondary | ICD-10-CM | POA: Diagnosis not present

## 2019-11-15 ENCOUNTER — Other Ambulatory Visit: Payer: 59

## 2019-11-15 DIAGNOSIS — M6281 Muscle weakness (generalized): Secondary | ICD-10-CM | POA: Diagnosis not present

## 2019-11-15 DIAGNOSIS — M25661 Stiffness of right knee, not elsewhere classified: Secondary | ICD-10-CM | POA: Diagnosis not present

## 2019-11-15 DIAGNOSIS — Z4789 Encounter for other orthopedic aftercare: Secondary | ICD-10-CM | POA: Diagnosis not present

## 2019-11-15 DIAGNOSIS — M25561 Pain in right knee: Secondary | ICD-10-CM | POA: Diagnosis not present

## 2019-11-17 DIAGNOSIS — M6281 Muscle weakness (generalized): Secondary | ICD-10-CM | POA: Diagnosis not present

## 2019-11-17 DIAGNOSIS — Z4789 Encounter for other orthopedic aftercare: Secondary | ICD-10-CM | POA: Diagnosis not present

## 2019-11-17 DIAGNOSIS — M25661 Stiffness of right knee, not elsewhere classified: Secondary | ICD-10-CM | POA: Diagnosis not present

## 2019-11-17 DIAGNOSIS — M25561 Pain in right knee: Secondary | ICD-10-CM | POA: Diagnosis not present

## 2019-11-20 DIAGNOSIS — M25561 Pain in right knee: Secondary | ICD-10-CM | POA: Diagnosis not present

## 2019-11-20 DIAGNOSIS — Z4789 Encounter for other orthopedic aftercare: Secondary | ICD-10-CM | POA: Diagnosis not present

## 2019-11-20 DIAGNOSIS — M6281 Muscle weakness (generalized): Secondary | ICD-10-CM | POA: Diagnosis not present

## 2019-11-20 DIAGNOSIS — M25661 Stiffness of right knee, not elsewhere classified: Secondary | ICD-10-CM | POA: Diagnosis not present

## 2019-11-22 DIAGNOSIS — Z4789 Encounter for other orthopedic aftercare: Secondary | ICD-10-CM | POA: Diagnosis not present

## 2019-11-22 DIAGNOSIS — M25661 Stiffness of right knee, not elsewhere classified: Secondary | ICD-10-CM | POA: Diagnosis not present

## 2019-11-22 DIAGNOSIS — M25561 Pain in right knee: Secondary | ICD-10-CM | POA: Diagnosis not present

## 2019-11-22 DIAGNOSIS — M6281 Muscle weakness (generalized): Secondary | ICD-10-CM | POA: Diagnosis not present

## 2019-11-23 DIAGNOSIS — M25661 Stiffness of right knee, not elsewhere classified: Secondary | ICD-10-CM | POA: Diagnosis not present

## 2019-11-23 DIAGNOSIS — Z4789 Encounter for other orthopedic aftercare: Secondary | ICD-10-CM | POA: Diagnosis not present

## 2019-11-23 DIAGNOSIS — M25561 Pain in right knee: Secondary | ICD-10-CM | POA: Diagnosis not present

## 2019-11-23 DIAGNOSIS — M6281 Muscle weakness (generalized): Secondary | ICD-10-CM | POA: Diagnosis not present

## 2019-11-27 ENCOUNTER — Telehealth: Payer: Self-pay | Admitting: *Deleted

## 2019-11-27 DIAGNOSIS — M6281 Muscle weakness (generalized): Secondary | ICD-10-CM | POA: Diagnosis not present

## 2019-11-27 DIAGNOSIS — M25661 Stiffness of right knee, not elsewhere classified: Secondary | ICD-10-CM | POA: Diagnosis not present

## 2019-11-27 DIAGNOSIS — Z4789 Encounter for other orthopedic aftercare: Secondary | ICD-10-CM | POA: Diagnosis not present

## 2019-11-27 DIAGNOSIS — M25561 Pain in right knee: Secondary | ICD-10-CM | POA: Diagnosis not present

## 2019-11-27 MED FILL — TECHLITE PEN NDL 32GX1/4: 32G X 6 MM | 90 days supply | Qty: 100 | Fill #1

## 2019-11-27 MED FILL — SAXENDA 18 MG/3 ML PEN: 18 | 30 days supply | Qty: 15 | Fill #0

## 2019-11-27 NOTE — Telephone Encounter (Signed)
Saxenda approved with [employer] for a max of 12 refills from 11/25/19-11/23/20. Pharmacy notified.

## 2019-11-29 DIAGNOSIS — M25661 Stiffness of right knee, not elsewhere classified: Secondary | ICD-10-CM | POA: Diagnosis not present

## 2019-11-29 DIAGNOSIS — M25561 Pain in right knee: Secondary | ICD-10-CM | POA: Diagnosis not present

## 2019-11-29 DIAGNOSIS — Z4789 Encounter for other orthopedic aftercare: Secondary | ICD-10-CM | POA: Diagnosis not present

## 2019-11-29 DIAGNOSIS — M6281 Muscle weakness (generalized): Secondary | ICD-10-CM | POA: Diagnosis not present

## 2019-12-01 DIAGNOSIS — M6281 Muscle weakness (generalized): Secondary | ICD-10-CM | POA: Diagnosis not present

## 2019-12-01 DIAGNOSIS — M25661 Stiffness of right knee, not elsewhere classified: Secondary | ICD-10-CM | POA: Diagnosis not present

## 2019-12-01 DIAGNOSIS — M25561 Pain in right knee: Secondary | ICD-10-CM | POA: Diagnosis not present

## 2019-12-01 DIAGNOSIS — Z4789 Encounter for other orthopedic aftercare: Secondary | ICD-10-CM | POA: Diagnosis not present

## 2019-12-04 DIAGNOSIS — M6281 Muscle weakness (generalized): Secondary | ICD-10-CM | POA: Diagnosis not present

## 2019-12-04 DIAGNOSIS — M25561 Pain in right knee: Secondary | ICD-10-CM | POA: Diagnosis not present

## 2019-12-04 DIAGNOSIS — M25661 Stiffness of right knee, not elsewhere classified: Secondary | ICD-10-CM | POA: Diagnosis not present

## 2019-12-04 DIAGNOSIS — Z4789 Encounter for other orthopedic aftercare: Secondary | ICD-10-CM | POA: Diagnosis not present

## 2019-12-06 DIAGNOSIS — M25561 Pain in right knee: Secondary | ICD-10-CM | POA: Diagnosis not present

## 2019-12-06 DIAGNOSIS — M6281 Muscle weakness (generalized): Secondary | ICD-10-CM | POA: Diagnosis not present

## 2019-12-06 DIAGNOSIS — M25661 Stiffness of right knee, not elsewhere classified: Secondary | ICD-10-CM | POA: Diagnosis not present

## 2019-12-06 DIAGNOSIS — Z4789 Encounter for other orthopedic aftercare: Secondary | ICD-10-CM | POA: Diagnosis not present

## 2019-12-08 DIAGNOSIS — M25561 Pain in right knee: Secondary | ICD-10-CM | POA: Diagnosis not present

## 2019-12-08 DIAGNOSIS — Z4789 Encounter for other orthopedic aftercare: Secondary | ICD-10-CM | POA: Diagnosis not present

## 2019-12-08 DIAGNOSIS — M25661 Stiffness of right knee, not elsewhere classified: Secondary | ICD-10-CM | POA: Diagnosis not present

## 2019-12-08 DIAGNOSIS — M6281 Muscle weakness (generalized): Secondary | ICD-10-CM | POA: Diagnosis not present

## 2019-12-11 DIAGNOSIS — M25661 Stiffness of right knee, not elsewhere classified: Secondary | ICD-10-CM | POA: Diagnosis not present

## 2019-12-11 DIAGNOSIS — M25561 Pain in right knee: Secondary | ICD-10-CM | POA: Diagnosis not present

## 2019-12-11 DIAGNOSIS — M6281 Muscle weakness (generalized): Secondary | ICD-10-CM | POA: Diagnosis not present

## 2019-12-11 DIAGNOSIS — Z4789 Encounter for other orthopedic aftercare: Secondary | ICD-10-CM | POA: Diagnosis not present

## 2019-12-13 MED FILL — SAXENDA 18 MG/3 ML PEN: 18 | 30 days supply | Qty: 15 | Fill #0

## 2019-12-13 MED FILL — TECHLITE PEN NDL 32GX1/4: 32G X 6 MM | 90 days supply | Qty: 100 | Fill #1

## 2019-12-14 DIAGNOSIS — M6281 Muscle weakness (generalized): Secondary | ICD-10-CM | POA: Diagnosis not present

## 2019-12-14 DIAGNOSIS — M25561 Pain in right knee: Secondary | ICD-10-CM | POA: Diagnosis not present

## 2019-12-14 DIAGNOSIS — M25661 Stiffness of right knee, not elsewhere classified: Secondary | ICD-10-CM | POA: Diagnosis not present

## 2019-12-14 DIAGNOSIS — Z4789 Encounter for other orthopedic aftercare: Secondary | ICD-10-CM | POA: Diagnosis not present

## 2019-12-18 DIAGNOSIS — M25561 Pain in right knee: Secondary | ICD-10-CM | POA: Diagnosis not present

## 2019-12-18 DIAGNOSIS — M25661 Stiffness of right knee, not elsewhere classified: Secondary | ICD-10-CM | POA: Diagnosis not present

## 2019-12-18 DIAGNOSIS — M6281 Muscle weakness (generalized): Secondary | ICD-10-CM | POA: Diagnosis not present

## 2019-12-18 DIAGNOSIS — Z4789 Encounter for other orthopedic aftercare: Secondary | ICD-10-CM | POA: Diagnosis not present

## 2019-12-26 DIAGNOSIS — Z4789 Encounter for other orthopedic aftercare: Secondary | ICD-10-CM | POA: Diagnosis not present

## 2019-12-26 DIAGNOSIS — M25561 Pain in right knee: Secondary | ICD-10-CM | POA: Diagnosis not present

## 2019-12-26 DIAGNOSIS — M25661 Stiffness of right knee, not elsewhere classified: Secondary | ICD-10-CM | POA: Diagnosis not present

## 2019-12-26 DIAGNOSIS — M6281 Muscle weakness (generalized): Secondary | ICD-10-CM | POA: Diagnosis not present

## 2019-12-27 DIAGNOSIS — Z96651 Presence of right artificial knee joint: Secondary | ICD-10-CM | POA: Diagnosis not present

## 2019-12-27 DIAGNOSIS — Z471 Aftercare following joint replacement surgery: Secondary | ICD-10-CM | POA: Diagnosis not present

## 2020-01-03 DIAGNOSIS — M25561 Pain in right knee: Secondary | ICD-10-CM | POA: Diagnosis not present

## 2020-01-03 DIAGNOSIS — M6281 Muscle weakness (generalized): Secondary | ICD-10-CM | POA: Diagnosis not present

## 2020-01-03 DIAGNOSIS — Z4789 Encounter for other orthopedic aftercare: Secondary | ICD-10-CM | POA: Diagnosis not present

## 2020-01-03 DIAGNOSIS — M25661 Stiffness of right knee, not elsewhere classified: Secondary | ICD-10-CM | POA: Diagnosis not present

## 2020-01-10 MED FILL — SAXENDA 18 MG/3 ML PEN: 18 | 30 days supply | Qty: 15 | Fill #1

## 2020-02-03 DIAGNOSIS — Z1152 Encounter for screening for COVID-19: Secondary | ICD-10-CM | POA: Diagnosis not present

## 2020-02-06 ENCOUNTER — Other Ambulatory Visit: Payer: Self-pay

## 2020-02-06 ENCOUNTER — Ambulatory Visit (INDEPENDENT_AMBULATORY_CARE_PROVIDER_SITE_OTHER): Payer: Self-pay | Admitting: Physician Assistant

## 2020-02-06 DIAGNOSIS — Z111 Encounter for screening for respiratory tuberculosis: Secondary | ICD-10-CM

## 2020-02-06 NOTE — Progress Notes (Signed)
Pt in today for PPD placement.  Given left arm with wheal clearly visible.

## 2020-02-06 NOTE — Progress Notes (Signed)
Patient ID: [name], female   DOB: [birth year], 60 y.o.   MRN: [id number] Agree with above plan.

## 2020-02-08 ENCOUNTER — Other Ambulatory Visit: Payer: Self-pay | Admitting: Physician Assistant

## 2020-02-08 ENCOUNTER — Ambulatory Visit (INDEPENDENT_AMBULATORY_CARE_PROVIDER_SITE_OTHER): Payer: Self-pay | Admitting: Family Medicine

## 2020-02-08 ENCOUNTER — Other Ambulatory Visit: Payer: Self-pay

## 2020-02-08 VITALS — BP 125/67 | HR 73

## 2020-02-08 DIAGNOSIS — Z111 Encounter for screening for respiratory tuberculosis: Secondary | ICD-10-CM

## 2020-02-08 LAB — TB SKIN TEST
Induration: 0 mm
TB Skin Test: NEGATIVE

## 2020-02-08 NOTE — Progress Notes (Signed)
Established Patient Office Visit  Subjective:  Patient ID: [name], female    DOB: 1960/06/25  Age: 60 y.o. MRN: [id number]  CC:  Chief Complaint  Patient presents with  . PPD Reading    HPI JOURNI MOFFA presents for PPD read.   Past Medical History:  Diagnosis Date  . Asthma    mild intermittent  . DDD (degenerative disc disease)    c spine  . Diverticulosis   . Hemorrhoids    Dr Zettie Pho  . Hemorrhoids   . History of gastric bypass   . Thyroid disease     Past Surgical History:  Procedure Laterality Date  . APPENDECTOMY    . DILATION AND CURETTAGE OF UTERUS    . HEEL SPUR SURGERY     R/L  . HERNIA REPAIR  2008   R inguinal hernia  . lap gaastric bypass    . LTCS     X 3   . NEUROMA SURGERY     left  . REDUCTION MAMMAPLASTY    . TOTAL ABDOMINAL HYSTERECTOMY     w/o oophorectomy/ non cancerous    Family History  Problem Relation Age of Onset  . Hypertension Father   . Diabetes Father   . Cancer Father        melanoma    Social History   Socioeconomic History  . Marital status: Divorced    Spouse name: Not on file  . Number of children: Not on file  . Years of education: Not on file  . Highest education level: Not on file  Occupational History  . Not on file  Tobacco Use  . Smoking status: Never Smoker  . Smokeless tobacco: Never Used  Substance and Sexual Activity  . Alcohol use: No  . Drug use: No  . Sexual activity: Yes    Birth control/protection: Condom  Other Topics Concern  . Not on file  Social History Narrative  . Not on file   Social Determinants of Health   Financial Resource Strain: Not on file  Food Insecurity: Not on file  Transportation Needs: Not on file  Physical Activity: Not on file  Stress: Not on file  Social Connections: Not on file  Intimate Partner Violence: Not on file    Outpatient Medications Prior to Visit  Medication Sig Dispense Refill  . acetic acid-hydrocortisone (VOSOL-HC) OTIC solution  Place 3 drops into both ears 2 (two) times daily. 10 mL 5  . Albuterol Sulfate (PROAIR RESPICLICK) 765 (90 Base) MCG/ACT AEPB Inhale 2 puffs into the lungs every 4 (four) hours. 30 each 2  . Ascorbic Acid (VITAMIN C) 1000 MG tablet Take 1,000 mg by mouth daily.    . calcium citrate-vitamin D (CITRACAL+D) 315-200 MG-UNIT tablet Take by mouth.    . cetirizine (ZYRTEC) 10 MG tablet Take by mouth.    . Cholecalciferol (VITAMIN D PO) Take 5,000 Int'l Units by mouth daily.    . cyclobenzaprine (FLEXERIL) 10 MG tablet Take 1 tablet (10 mg total) by mouth 3 (three) times daily as needed for muscle spasms. 30 tablet 0  . Ferrous Sulfate (IRON) 28 MG TABS Take 365 mg by mouth daily.    Marland Kitchen ibuprofen (ADVIL) 800 MG tablet Take 1 tablet (800 mg total) by mouth every 8 (eight) hours as needed. 90 tablet 2  . Insulin Pen Needle (NOVOFINE) 32G X 6 MM MISC USE AS DIRECETED WITH SAXENDA 100 each 1  . levothyroxine (SYNTHROID) 88 MCG tablet Take  1 tablet (88 mcg total) by mouth daily before breakfast. 90 tablet 3  . Liraglutide -Weight Management (SAXENDA) 18 MG/3ML SOPN Inject 3 mg into the skin daily. 15 mL 1  . Melatonin 10 MG TABS Take 10 mg by mouth at bedtime.    . Multiple Vitamin (MULTIVITAMIN) capsule Take by mouth.    . ondansetron (ZOFRAN) 8 MG tablet Take 1 tablet (8 mg total) by mouth every 8 (eight) hours as needed for nausea or vomiting. 20 tablet 0  . valACYclovir (VALTREX) 1000 MG tablet Take 1 tablet (1,000 mg total) by mouth 2 (two) times daily. 20 tablet 0  . diclofenac sodium (VOLTAREN) 1 % GEL Apply 4 g topically 4 (four) times daily. To affected joint. 100 g 1  . triamcinolone cream (KENALOG) 0.1 % Apply 1 application topically 2 (two) times daily. 60 g 0   No facility-administered medications prior to visit.    Allergies  Allergen Reactions  . Contrave [Naltrexone-Bupropion Hcl Er]     Dizziness, nausea, severe headache.   . Phentermine     Increased blood pressure  . Sulfonamide  Derivatives     REACTION: all sulfa meds causes hives    ROS Review of Systems    Objective:    Physical Exam  BP 125/67   Pulse 73   SpO2 100%  Wt Readings from Last 3 Encounters:  11/01/19 199 lb (90.3 kg)  08/01/19 211 lb (95.7 kg)  06/28/19 210 lb (95.3 kg)     There are no preventive care reminders to display for this patient.  There are no preventive care reminders to display for this patient.  Lab Results  Component Value Date   TSH 0.81 07/18/2019   Lab Results  Component Value Date   WBC 6.0 08/18/2016   HGB 12.6 08/18/2016   HCT 38.2 08/18/2016   MCV 87.4 08/18/2016   PLT 294 08/18/2016   Lab Results  Component Value Date   NA 139 07/18/2019   K 4.2 07/18/2019   CO2 27 07/18/2019   GLUCOSE 83 07/18/2019   BUN 9 07/18/2019   CREATININE 0.68 07/18/2019   BILITOT 0.4 07/18/2019   ALKPHOS 83 08/18/2016   AST 14 07/18/2019   ALT 4 (L) 07/18/2019   PROT 6.6 07/18/2019   ALBUMIN 4.3 08/18/2016   CALCIUM 9.2 07/18/2019   Lab Results  Component Value Date   CHOL 223 (H) 07/18/2019   Lab Results  Component Value Date   HDL 67 07/18/2019   Lab Results  Component Value Date   LDLCALC 137 (H) 07/18/2019   Lab Results  Component Value Date   TRIG 88 07/18/2019   Lab Results  Component Value Date   CHOLHDL 3.3 07/18/2019   No results found for: HGBA1C    Assessment & Plan:  TB Screening - Negative 0 mm   Problem List Items Addressed This Visit   None   Visit Diagnoses    Screening-pulmonary TB    -  Primary      No orders of the defined types were placed in this encounter.   Follow-up: No follow-ups on file.    Lavell Luster, [place]

## 2020-02-08 NOTE — Progress Notes (Signed)
Agree with documentation as above.   Cyla Haluska, MD  

## 2020-02-09 MED ORDER — ONDANSETRON HCL 8 MG PO TABS: 8.0000 mg | ORAL_TABLET | Freq: Three times a day (TID) | ORAL | 0 refills | Status: DC | PRN

## 2020-02-09 MED FILL — ONDANSETRON HCL 8 MG TABLET: 8 | 6 days supply | Qty: 20 | Fill #0

## 2020-02-20 ENCOUNTER — Ambulatory Visit (INDEPENDENT_AMBULATORY_CARE_PROVIDER_SITE_OTHER): Payer: Self-pay | Admitting: Physician Assistant

## 2020-02-20 VITALS — BP 122/60 | HR 86

## 2020-02-20 DIAGNOSIS — Z111 Encounter for screening for respiratory tuberculosis: Secondary | ICD-10-CM

## 2020-02-20 NOTE — Progress Notes (Signed)
Patient ID: [name], female   DOB: 09/09/1960, 59 y.o.   MRN: [id number] Agree with above plan.  

## 2020-02-20 NOTE — Progress Notes (Signed)
Established Patient Office Visit  Subjective:  Patient ID: [name], female    DOB: Nov 13, 1960  Age: 60 y.o. MRN: [id number]  CC:  Chief Complaint  Patient presents with  . PPD Placement    HPI [name] presents for PPD placement.   Past Medical History:  Diagnosis Date  . Asthma    mild intermittent  . DDD (degenerative disc disease)    c spine  . Diverticulosis   . Hemorrhoids    Dr Zettie Pho  . Hemorrhoids   . History of gastric bypass   . Thyroid disease     Past Surgical History:  Procedure Laterality Date  . APPENDECTOMY    . DILATION AND CURETTAGE OF UTERUS    . HEEL SPUR SURGERY     R/L  . HERNIA REPAIR  2008   R inguinal hernia  . lap gaastric bypass    . LTCS     X 3   . NEUROMA SURGERY     left  . REDUCTION MAMMAPLASTY    . TOTAL ABDOMINAL HYSTERECTOMY     w/o oophorectomy/ non cancerous    Family History  Problem Relation Age of Onset  . Hypertension Father   . Diabetes Father   . Cancer Father        melanoma    Social History   Socioeconomic History  . Marital status: Divorced    Spouse name: Not on file  . Number of children: Not on file  . Years of education: Not on file  . Highest education level: Not on file  Occupational History  . Not on file  Tobacco Use  . Smoking status: Never Smoker  . Smokeless tobacco: Never Used  Substance and Sexual Activity  . Alcohol use: No  . Drug use: No  . Sexual activity: Yes    Birth control/protection: Condom  Other Topics Concern  . Not on file  Social History Narrative  . Not on file   Social Determinants of Health   Financial Resource Strain: Not on file  Food Insecurity: Not on file  Transportation Needs: Not on file  Physical Activity: Not on file  Stress: Not on file  Social Connections: Not on file  Intimate Partner Violence: Not on file    Outpatient Medications Prior to Visit  Medication Sig Dispense Refill  . acetic acid-hydrocortisone (VOSOL-HC) OTIC  solution Place 3 drops into both ears 2 (two) times daily. 10 mL 5  . Albuterol Sulfate (PROAIR RESPICLICK) 517 (90 Base) MCG/ACT AEPB Inhale 2 puffs into the lungs every 4 (four) hours. 30 each 2  . Ascorbic Acid (VITAMIN C) 1000 MG tablet Take 1,000 mg by mouth daily.    . calcium citrate-vitamin D (CITRACAL+D) 315-200 MG-UNIT tablet Take by mouth.    . cetirizine (ZYRTEC) 10 MG tablet Take by mouth.    . Cholecalciferol (VITAMIN D PO) Take 5,000 Int'l Units by mouth daily.    . cyclobenzaprine (FLEXERIL) 10 MG tablet Take 1 tablet (10 mg total) by mouth 3 (three) times daily as needed for muscle spasms. 30 tablet 0  . Ferrous Sulfate (IRON) 28 MG TABS Take 365 mg by mouth daily.    Marland Kitchen ibuprofen (ADVIL) 800 MG tablet Take 1 tablet (800 mg total) by mouth every 8 (eight) hours as needed. 90 tablet 2  . Insulin Pen Needle (NOVOFINE) 32G X 6 MM MISC USE AS DIRECETED WITH SAXENDA 100 each 1  . levothyroxine (SYNTHROID) 88 MCG tablet Take  1 tablet (88 mcg total) by mouth daily before breakfast. 90 tablet 3  . Melatonin 10 MG TABS Take 10 mg by mouth at bedtime.    . Multiple Vitamin (MULTIVITAMIN) capsule Take by mouth.    . ondansetron (ZOFRAN) 8 MG tablet Take 1 tablet (8 mg total) by mouth every 8 (eight) hours as needed for nausea or vomiting. 20 tablet 0  . valACYclovir (VALTREX) 1000 MG tablet Take 1 tablet (1,000 mg total) by mouth 2 (two) times daily. 20 tablet 0  . diclofenac sodium (VOLTAREN) 1 % GEL Apply 4 g topically 4 (four) times daily. To affected joint. 100 g 1  . triamcinolone cream (KENALOG) 0.1 % Apply 1 application topically 2 (two) times daily. 60 g 0  . Liraglutide -Weight Management (SAXENDA) 18 MG/3ML SOPN Inject 3 mg into the skin daily. 15 mL 1   No facility-administered medications prior to visit.    Allergies  Allergen Reactions  . Contrave [Naltrexone-Bupropion Hcl Er]     Dizziness, nausea, severe headache.   . Phentermine     Increased blood pressure  .  Sulfonamide Derivatives     REACTION: all sulfa meds causes hives    ROS Review of Systems    Objective:    Physical Exam  BP 122/60   Pulse 86   SpO2 98%  Wt Readings from Last 3 Encounters:  11/01/19 199 lb (90.3 kg)  08/01/19 211 lb (95.7 kg)  06/28/19 210 lb (95.3 kg)     There are no preventive care reminders to display for this patient.  There are no preventive care reminders to display for this patient.  Lab Results  Component Value Date   TSH 0.81 07/18/2019   Lab Results  Component Value Date   WBC 6.0 08/18/2016   HGB 12.6 08/18/2016   HCT 38.2 08/18/2016   MCV 87.4 08/18/2016   PLT 294 08/18/2016   Lab Results  Component Value Date   NA 139 07/18/2019   K 4.2 07/18/2019   CO2 27 07/18/2019   GLUCOSE 83 07/18/2019   BUN 9 07/18/2019   CREATININE 0.68 07/18/2019   BILITOT 0.4 07/18/2019   ALKPHOS 83 08/18/2016   AST 14 07/18/2019   ALT 4 (L) 07/18/2019   PROT 6.6 07/18/2019   ALBUMIN 4.3 08/18/2016   CALCIUM 9.2 07/18/2019   Lab Results  Component Value Date   CHOL 223 (H) 07/18/2019   Lab Results  Component Value Date   HDL 67 07/18/2019   Lab Results  Component Value Date   LDLCALC 137 (H) 07/18/2019   Lab Results  Component Value Date   TRIG 88 07/18/2019   Lab Results  Component Value Date   CHOLHDL 3.3 07/18/2019   No results found for: HGBA1C    Assessment & Plan:  PPD placement - Patient tolerated injection well without complications. Patient advised to schedule PPD read 2 days from today.    Problem List Items Addressed This Visit   None   Visit Diagnoses    Screening-pulmonary TB    -  Primary   Relevant Orders   PPD (Completed)      No orders of the defined types were placed in this encounter.   Follow-up: Return in about 2 days (around 02/22/2020) for PPD read.    Lavell Luster, [place]

## 2020-02-22 ENCOUNTER — Other Ambulatory Visit: Payer: Self-pay

## 2020-02-22 ENCOUNTER — Ambulatory Visit (INDEPENDENT_AMBULATORY_CARE_PROVIDER_SITE_OTHER): Payer: Self-pay | Admitting: Medical-Surgical

## 2020-02-22 DIAGNOSIS — Z111 Encounter for screening for respiratory tuberculosis: Secondary | ICD-10-CM

## 2020-02-22 NOTE — Progress Notes (Signed)
Patient is here for PPD read.   Results were Negative 0 mm.   Note written, she will print off mychart.

## 2020-03-06 ENCOUNTER — Other Ambulatory Visit: Payer: Self-pay | Admitting: Neurology

## 2020-03-06 MED ORDER — ALBUTEROL SULFATE HFA 108 (90 BASE) MCG/ACT IN AERS: 2.0000 | INHALATION_SPRAY | Freq: Four times a day (QID) | RESPIRATORY_TRACT | 0 refills | Status: DC | PRN

## 2020-03-06 MED FILL — ALBUTEROL SULFATE HFA 108 (: 108 (90 BAS | 16 days supply | Qty: 18 | Fill #0

## 2020-03-06 NOTE — Telephone Encounter (Signed)
Received request from pharmacy that they will not cover Proair, that they want her to try albuterol or Levalbuterol. Sent Albuterol to pharmacy. FYI.

## 2020-03-07 MED FILL — LEVOTHYROXINE SODIUM 88 MCG: 88 | 90 days supply | Qty: 90 | Fill #2

## 2020-03-27 ENCOUNTER — Encounter: Payer: Self-pay | Admitting: Physician Assistant

## 2020-04-29 ENCOUNTER — Encounter: Payer: Self-pay | Admitting: Physician Assistant

## 2020-04-30 ENCOUNTER — Other Ambulatory Visit: Payer: Self-pay | Admitting: Physician Assistant

## 2020-05-01 MED ORDER — VALACYCLOVIR HCL 1 G PO TABS: 1000.0000 mg | ORAL_TABLET | Freq: Two times a day (BID) | ORAL | 0 refills | Status: DC

## 2020-05-14 ENCOUNTER — Ambulatory Visit: Payer: BC Managed Care – PPO | Admitting: Sports Medicine

## 2020-05-14 ENCOUNTER — Other Ambulatory Visit: Payer: Self-pay

## 2020-05-14 DIAGNOSIS — I8390 Asymptomatic varicose veins of unspecified lower extremity: Secondary | ICD-10-CM | POA: Diagnosis not present

## 2020-05-14 MED ORDER — AMBULATORY NON FORMULARY MEDICATION: 0 refills | Status: AC

## 2020-05-14 NOTE — Assessment & Plan Note (Signed)
This is a pleasant 60 year old female, she used to work in the operating room, she has a long history of leg achiness, pain, swelling with varicose veins, reticular, and spider veins bilaterally, right worse than left, predominantly along the great saphenous vein. She is likely going to need a surgical/laser vein stripping type procedure. I will write her prescription for thigh-high graduated compression stockings and set her up with our vein clinic.

## 2020-05-14 NOTE — Progress Notes (Signed)
    Procedures performed today:    None.  Independent interpretation of notes and tests performed by another provider:   None.  Brief History, Exam, Impression, and Recommendations:    VARICOSE VEINS This is a pleasant 60 year old female, she used to work in the operating room, she has a long history of leg achiness, pain, swelling with varicose veins, reticular, and spider veins bilaterally, right worse than left, predominantly along the great saphenous vein. She is likely going to need a surgical/laser vein stripping type procedure. I will write her prescription for thigh-high graduated compression stockings and set her up with our vein clinic.    ___________________________________________ Gwen Her. Dianah Field, M.D., ABFM., CAQSM. Primary Care and [place] [employer] of [employer] [facility] [employer] [employer]

## 2020-05-14 NOTE — Patient Instructions (Signed)
Nonsurgical Procedures for Varicose Veins Various nonsurgical procedures can be used to treat varicose veins. Varicose veins are swollen, twisted veins that are visible under the skin. They occur most often in the legs. These veins may appear blue and bulging. Varicose veins are caused by damage to the valves in veins. All veins have a valve that makes blood flow in only one direction. If a valve gets weak or damaged, blood can pool and cause varicose veins. You may need a procedure to treat your varicose veins if they are causing symptoms or complications, or if lifestyle changes have not helped. These procedures can reduce pain, aching, and the risk of bleeding and blood clots. They can also improve the way the affected area looks (cosmetic appearance). The three common nonsurgical procedures are:  Sclerotherapy. A chemical is injected to close off a vein.  Laser treatment. Light energy is applied to close off the vein.  Radiofrequency vein ablation. Electrical energy is used to produce heat that closes off the vein. Your health care provider will discuss the method that is best for you based on your condition. Tell a health care provider about:  Any allergies you have.  All medicines you are taking, including vitamins, herbs, eye drops, creams, and over-the-counter medicines.  Any problems you or family members have had with anesthetic medicines.  Any blood disorders you have.  Any surgeries you have had.  Any medical conditions you have.  Whether you are pregnant or may be pregnant. What are the risks? Generally, this is a safe procedure. However, problems may occur, including:  Damage to nearby nerves, tissues, or veins.  Skin irritation, sores, or dark spots.  Numbness.  Clotting.  Infection.  Allergic reactions to medicines.  Scarring.  Leg swelling.  Need for additional treatments.  Bruising. What happens before the procedure?  Ask your health care  provider about: ? Changing or stopping your regular medicines. This is especially important if you are taking diabetes medicines or blood thinners. ? Taking over-the-counter medicines, vitamins, herbs, and supplements. ? Taking medicines such as aspirin and ibuprofen. These medicines can thin your blood. Do not take these medicines unless your health care provider tells you to take them.  You may have an exam or testing. This can include a tests to: ? Check for clots and check blood flow using sound waves (Doppler ultrasound). ? Observe how blood flows through your veins by injecting a dye that outlines your veins on X-rays (angiogram). This test is used in rare cases. What happens during the procedure? One of the following procedures will be performed: Sclerotherapy This procedure is often used for small to medium veins.  A chemical (sclerosant) that irritates the lining of the vein will be injected into the vein. This will cause the varicose vein to be closed off. Sclerosants in different amounts and strengths can be used, depending on the size and location of the vein.  All of the varicose vein sites will be injected. You may need more than one treatment because new varicose veins may develop, or more than one injection may be needed for each varicose vein.   Laser treatment There are two ways that lasers are used to treat varicose veins:  Light energy from a laser may be directed onto the vein through the skin.  A needle may be used to pass a thin laser catheter into the vein to cause it to close. You may need more than one treatment if the vein re-opens.  In some cases, laser treatment may be combined with sclerotherapy.   Radiofrequency vein ablation  You will be given a medicine that numbs the area (local anesthetic).  A small incision will be made near the varicose vein.  A thin tube (catheter) will be threaded into your vein.  The tip of the catheter will deploy  electrodes.  The electrodes will deliver electrical energy to produce heat that closes off the vein.   What happens after the procedure?  A bandage (dressing) may be used to cover the injection site or incisions.  You may have to wear compression stockings. These stockings help to prevent blood clots and reduce swelling in your legs.  Return to your normal activities as told by your health care provider. Summary  Varicose veins are swollen, twisted veins that are visible under the skin. They occur most often in the legs.  Various procedures can be used to treat varicose veins. You may need a procedure to treat your varicose veins if they are causing symptoms or complications, or if lifestyle changes have not helped.  Your health care provider will discuss the method that is best for you based on your condition. This information is not intended to replace advice given to you by your health care provider. Make sure you discuss any questions you have with your health care provider. Document Revised: 10/13/2019 Document Reviewed: 10/13/2019 Elsevier Patient Education  2021 [place]. Surgical Procedures for Varicose Veins Surgical procedures can be used to treat varicose veins. Varicose veins are swollen, twisted veins that are visible under the skin. They occur most often in the legs. These veins may appear blue and bulging. Varicose veins are caused by damage to the valves in veins. All veins have a valve that keeps blood flowing in only one direction. If a valve gets weak or damaged, blood can pool and cause varicose veins. You may need surgery to treat your varicose veins if they are causing symptoms or complications, or if lifestyle changes have not helped. These procedures can lower the risk of bleeding and blood clots, and reduce pain and aching. They can also improve the way the affected area looks (cosmetic appearance). Two common surgical procedures are:  Phlebectomy. The vein is  surgically removed through a small incision. This procedure is used to remove the veins closest to the skin.  Vein ligation and stripping. The vein is surgically removed through incisions after the vein has been tied off. This procedure is used to treat severe cases. Based on your overall condition, your health care provider will discuss the method that is best for you. Tell a health care provider about:  Any allergies you have.  All medicines you are taking, including vitamins, herbs, eye drops, creams, and over-the-counter medicines.  Any problems you or family members have had with anesthetic medicines.  Any blood disorders you have.  Any surgeries you have had.  Any medical conditions you have.  Whether you are pregnant or may be pregnant. What are the risks? Generally, this is a safe procedure. However, problems may occur, including:  Damage to nearby nerves, tissues, or veins.  Skin irritation, sores, or dark spots.  Numbness.  Clotting.  Infection.  Allergic reactions to medicines.  Scarring.  Leg swelling.  Need for additional treatments. What happens before the procedure?  Follow instructions from your health care provider about eating or drinking restrictions.  Ask your health care provider about: ? Changing or stopping your regular medicines. This is especially important  if you are taking diabetes medicines or blood thinners. ? Taking over-the-counter medicines, vitamins, herbs, and supplements. ? Taking medicines such as aspirin and ibuprofen. These medicines can thin your blood. Do not take these medicines unless your health care provider tells you to take them.  You may have an exam or testing. This can include a test to: ? Check for clots and check blood flow using sound waves (Doppler ultrasound). ? Observe how blood flows through your veins by injecting a dye that outlines your veins on X-rays (angiogram). This test is used in rare cases.  Plan to  have someone take you home from the hospital or clinic.  Ask your health care provider how your surgical site will be marked or identified.  You may be given antibiotic medicine to help prevent infection.  You may be asked to shower with a germ-killing soap. What happens during the procedure?  To lower your risk of infection: ? Your health care team will wash or sanitize their hands. ? Hair may be removed from the surgical area. ? Your skin will be washed with soap.  One of the following procedures will be performed: Phlebectomy  You will be given a medicine to numb the area (local anesthetic).  A small puncture will be made close to each of the varicose veins.  A tiny hook will be used to pull out the vein.  Bandages (dressings) or adhesive strips will be used to cover the puncture sites.   Vein ligation and stripping  You will be given a medicine to make you fall asleep (general anesthetic).  A small incision will be made near the vein in your groin (saphenous vein).  The surgeon will tie off (ligate) the vein.  Several more incisions will then be made along the vein.  The vein will be removed (stripped).  The incisions will be closed with stitches (sutures) under the skin.  Bandages or adhesive strips will be used to cover the incision sites. What happens after the procedure?  Your blood pressure, heart rate, breathing rate, and blood oxygen level will be monitored until the medicines you were given have worn off.  You may have to wear compression stockings. These stockings help to prevent blood clots and reduce swelling in your legs. Summary  Varicose veins are swollen, twisted veins that are visible under the skin. They occur most often in the legs.  You may need surgery to treat your varicose veins if they are causing symptoms or complications, or if lifestyle changes have not helped.  Your health care provider will discuss the method that is best for you based  on your condition. This information is not intended to replace advice given to you by your health care provider. Make sure you discuss any questions you have with your health care provider. Document Revised: 05/25/2019 Document Reviewed: 05/25/2019 Elsevier Patient Education  [place].

## 2020-05-31 ENCOUNTER — Other Ambulatory Visit: Payer: Self-pay | Admitting: *Deleted

## 2020-05-31 DIAGNOSIS — I8390 Asymptomatic varicose veins of unspecified lower extremity: Secondary | ICD-10-CM

## 2020-06-11 DIAGNOSIS — Z9884 Bariatric surgery status: Secondary | ICD-10-CM | POA: Insufficient documentation

## 2020-06-17 ENCOUNTER — Encounter (HOSPITAL_COMMUNITY): Payer: BC Managed Care – PPO

## 2020-08-13 ENCOUNTER — Encounter: Payer: Self-pay | Admitting: Physician Assistant

## 2020-08-20 ENCOUNTER — Ambulatory Visit (INDEPENDENT_AMBULATORY_CARE_PROVIDER_SITE_OTHER): Payer: BC Managed Care – PPO | Admitting: Physician Assistant

## 2020-08-20 ENCOUNTER — Other Ambulatory Visit: Payer: Self-pay

## 2020-08-20 VITALS — BP 129/73 | HR 81 | Temp 98.6°F

## 2020-08-20 DIAGNOSIS — Z23 Encounter for immunization: Secondary | ICD-10-CM

## 2020-08-20 NOTE — Progress Notes (Signed)
Patient ID: [name], female   DOB: Jun 24, 1960, 60 y.o.   MRN: [id number] Agree with above plan.

## 2020-08-20 NOTE — Progress Notes (Signed)
[name] requesting refill of Saxenda.  She states that she has an appt with you next Tuesday, August 27, 2020.  Charyl Bigger, CMA

## 2020-08-21 ENCOUNTER — Ambulatory Visit: Payer: BC Managed Care – PPO | Admitting: Physician Assistant

## 2020-08-27 ENCOUNTER — Ambulatory Visit (INDEPENDENT_AMBULATORY_CARE_PROVIDER_SITE_OTHER): Payer: BC Managed Care – PPO | Admitting: Physician Assistant

## 2020-08-27 ENCOUNTER — Encounter: Payer: Self-pay | Admitting: Physician Assistant

## 2020-08-27 VITALS — BP 111/58 | HR 65 | Temp 98.0°F | Ht 67.0 in | Wt 191.0 lb

## 2020-08-27 DIAGNOSIS — R002 Palpitations: Secondary | ICD-10-CM

## 2020-08-27 DIAGNOSIS — Z131 Encounter for screening for diabetes mellitus: Secondary | ICD-10-CM | POA: Diagnosis not present

## 2020-08-27 DIAGNOSIS — Z79899 Other long term (current) drug therapy: Secondary | ICD-10-CM | POA: Diagnosis not present

## 2020-08-27 DIAGNOSIS — R0789 Other chest pain: Secondary | ICD-10-CM

## 2020-08-27 DIAGNOSIS — E663 Overweight: Secondary | ICD-10-CM

## 2020-08-27 DIAGNOSIS — E039 Hypothyroidism, unspecified: Secondary | ICD-10-CM | POA: Diagnosis not present

## 2020-08-27 DIAGNOSIS — F439 Reaction to severe stress, unspecified: Secondary | ICD-10-CM

## 2020-08-27 DIAGNOSIS — Z13220 Encounter for screening for lipoid disorders: Secondary | ICD-10-CM

## 2020-08-27 MED ORDER — WEGOVY 0.5 MG/0.5ML ~~LOC~~ SOAJ: 0.5000 mg | SUBCUTANEOUS | 0 refills | Status: DC

## 2020-08-27 NOTE — Progress Notes (Signed)
Subjective:    Patient ID: [name], female    DOB: [birth year], 60 y.o.   MRN: [id number]  HPI [name] a 60 yo overweight female with hypothyroidism who presents to the clinic for follow up.   She was on saxenda and just stopped working. She initially had significant appetite suppression and chitin control but over time just waned in efficacy.  She would like to try something else for weight loss.  She is interested in [place].  She does need labs for her thyroid.  She is compliant with her medication.  She does mention some palpitations over the past 6 weeks.  She has had a lot of stress at home.  Her son is transitioning to a female.  She has a lot going on and stressed out.  She will noticed that her heart rate will jump up and she does get some chest tightness.  This is not with exertion.  She denies any chest pain.  She has not had any diagnosed cardiac if she before.    .. Active Ambulatory Problems    Diagnosis Date Noted   Goiter, unspecified 12/01/2006   THYROID NODULE 02/04/2009   B12 DEFICIENCY 11/02/2007   HYPERCHOLESTEROLEMIA 11/03/2005   Major depressive disorder, recurrent episode ([place]) 11/03/2005   VARICOSE VEINS 11/03/2005   EXTERNAL HEMORRHOIDS WITHOUT MENTION COMP 10/24/2007   ALLERGIC RHINITIS 04/06/2006   HERNIA, UNILATERAL INGUINAL, W/O OBST/GNGR 05/06/2006   Diverticulitis of colon (without mention of hemorrhage)(562.11) 11/03/2005   ARTHRITIS, ACROMIOCLAVICULAR 05/08/2009   Acute pain of right knee 09/26/2009   DISC DISEASE, CERVICAL 05/08/2009   BARIATRIC SURGERY STATUS 03/16/2007   ASTHMA, WITH ACUTE EXACERBATION 02/05/2010   Thyromegaly 06/26/2010   Fatigue 06/26/2010   Migraines 06/26/2010   Asthma, mild intermittent 01/01/2014   Depression 01/01/2014   Thyroid activity decreased 01/01/2014   Vitamin D deficiency 01/01/2014   Class 2 obesity due to excess calories without serious comorbidity with body mass index (BMI) of 35.0 to 35.9 in adult  04/29/2015   Adenomatous polyps 09/04/2015   Primary osteoarthritis of both knees 03/25/2016   BMI 32.0-32.9,adult 05/19/2016   Dermatitis of left ear canal 04/08/2017   Nausea 04/23/2017   Drug reaction 04/23/2017   Muscle spasm 04/23/2017   Neck pain 04/23/2017   Overweight (BMI 25.0-29.9) 08/10/2017   Injury of left toe 08/07/2019   Bilateral chronic knee pain 08/07/2019   Class 1 obesity due to excess calories without serious comorbidity with body mass index (BMI) of 32.0 to 32.9 in adult 11/01/2019   Acromioclavicular joint arthritis 11/19/2010   Closed fracture of distal end of left radius 06/04/2014   Contact dermatitis and other eczema due to other chemical products 11/20/2010   Biceps tendonitis 01/02/2011   Diverticulosis of intestine 12/23/2010   History of adenomatous polyp of colon 09/03/2015   History of HPV infection 09/23/2012   Impingement syndrome of shoulder region 12/11/2010   Iron deficiency anemia [id number]   Lichen planus [id number]   Multiple thyroid nodules 09/01/2011   Left knee pain 12/08/2011   Pain in right knee 09/19/2019   Perineal rash in female 11/19/2010   S/P gastric bypass 06/11/2020   Type 2 superior labral anterior-to-posterior (SLAP) tear of shoulder 01/02/2011   Stress at home 08/27/2020   Palpitations 08/27/2020   Chest tightness 08/27/2020   Resolved Ambulatory Problems    Diagnosis Date Noted   Other chronic sinusitis 12/01/2006   SHOULDER PAIN 02/04/2006   CERVICAL LYMPHADENOPATHY 10/24/2007  Right upper quadrant pain 04/17/2010   VULVAR CANDIDIASIS 10/28/2010   EUSTACHIAN TUBE DYSFUNCTION, RIGHT 08/31/2010   TINNITUS, RIGHT 08/31/2010   ANAL PRURITUS 10/28/2010   Swollen uvula 10/08/2015   Primary osteoarthritis of knee 08/07/2019   Past Medical History:  Diagnosis Date   Asthma    DDD (degenerative disc disease)    Diverticulosis    Hemorrhoids    Hemorrhoids    History of gastric bypass    Thyroid disease        Review of Systems See HPI.     Objective:   Physical Exam Vitals reviewed.  Constitutional:      Appearance: Normal appearance. She is obese.  HENT:     Head: Normocephalic.  Cardiovascular:     Pulses: Normal pulses.     Heart sounds: Normal heart sounds. No murmur heard.    Comments: Scattered PVC.  Pulmonary:     Effort: Pulmonary effort is normal.     Breath sounds: Normal breath sounds.  Neurological:     General: No focal deficit present.     Mental Status: She is alert and oriented to person, place, and time.  Psychiatric:        Mood and Affect: Mood normal.   .. Depression screen [facility] 2/9 08/27/2020 08/01/2019 06/28/2019 06/22/2018 04/06/2017  Decreased Interest 0 0 0 1 0  Down, Depressed, Hopeless 1 0 0 2 0  PHQ - 2 Score 1 0 0 3 0  Altered sleeping '1 1 1 2 1  '$ Tired, decreased energy '3 1 3 3 2  '$ Change in appetite 2 0 0 3 1  Feeling bad or failure about yourself  0 0 0 1 0  Trouble concentrating 1 0 0 2 0  Moving slowly or fidgety/restless 0 0 0 0 0  Suicidal thoughts 0 0 0 0 -  PHQ-9 Score '8 2 4 14 4  '$ Difficult doing work/chores Somewhat difficult Not difficult at all Somewhat difficult Very difficult Not difficult at all   .Marland Kitchen GAD 7 : Generalized Anxiety Score 08/27/2020 08/01/2019 06/28/2019 06/22/2018  Nervous, Anxious, on Edge 0 0 0 1  Control/stop worrying 0 0 0 2  Worry too much - different things 1 0 0 2  Trouble relaxing 0 0 0 1  Restless 0 0 0 0  Easily annoyed or irritable 1 1 0 2  Afraid - awful might happen 0 0 0 0  Total GAD 7 Score 2 1 0 8  Anxiety Difficulty Somewhat difficult Not difficult at all Not difficult at all Somewhat difficult           Assessment & Plan:  Marland KitchenMarland KitchenTaleeyah was seen today for weight check and hypothyroidism.  Diagnoses and all orders for this visit:  Acquired hypothyroidism -     TSH  Medication management -     TSH -     COMPLETE METABOLIC PANEL WITH GFR -     Lipid Panel w/reflex Direct LDL  Screening for  diabetes mellitus -     COMPLETE METABOLIC PANEL WITH GFR  Screening for lipid disorders -     Lipid Panel w/reflex Direct LDL  Palpitations -     CBC with Differential/Platelet -     LONG TERM MONITOR (3-14 DAYS); Future  Stress at home  Overweight (BMI 25.0-29.9) -     WEGOVY 0.5 MG/0.5ML SOAJ; Inject 0.5 mg into the skin once a week. Use this dose for 1 month (4 shots) and then increase to next higher  dose.  Chest tightness -     LONG TERM MONITOR (3-14 DAYS); Future  New insurance on saxenda just not helping as much as had previously. Sent wegovy. Discussed titration to call. Follow up in 3 months.   Palpitations ordered zio patch. TSH and CBC ordered. Discussed stress. Pt declined medication today. Will adjust levothyroxine as needed.    Spent 40 minutes with patient reviewing chart, discussing medications, going over treatment plan and discussing stress at home.

## 2020-08-27 NOTE — Patient Instructions (Addendum)
Ordered labs and heart monitor Try wegovy for weight loss.

## 2020-08-28 ENCOUNTER — Encounter: Payer: Self-pay | Admitting: Physician Assistant

## 2020-08-28 LAB — CBC WITH DIFFERENTIAL/PLATELET
Absolute Monocytes: 262 cells/uL (ref 200–950)
Basophils Absolute: 30 cells/uL (ref 0–200)
Basophils Relative: 0.8 %
Eosinophils Absolute: 190 cells/uL (ref 15–500)
Eosinophils Relative: 5 %
HCT: 38.7 % (ref 35.0–45.0)
Hemoglobin: 12.7 g/dL (ref 11.7–15.5)
Lymphs Abs: 1205 cells/uL (ref 850–3900)
MCH: 29.9 pg (ref 27.0–33.0)
MCHC: 32.8 g/dL (ref 32.0–36.0)
MCV: 91.1 fL (ref 80.0–100.0)
MPV: 12 fL (ref 7.5–12.5)
Monocytes Relative: 6.9 %
Neutro Abs: 2113 cells/uL (ref 1500–7800)
Neutrophils Relative %: 55.6 %
Platelets: 211 10*3/uL (ref 140–400)
RBC: 4.25 10*6/uL (ref 3.80–5.10)
RDW: 13.3 % (ref 11.0–15.0)
Total Lymphocyte: 31.7 %
WBC: 3.8 10*3/uL (ref 3.8–10.8)

## 2020-08-29 ENCOUNTER — Other Ambulatory Visit: Payer: Self-pay | Admitting: Physician Assistant

## 2020-08-29 ENCOUNTER — Encounter: Payer: Self-pay | Admitting: Physician Assistant

## 2020-08-29 DIAGNOSIS — E039 Hypothyroidism, unspecified: Secondary | ICD-10-CM

## 2020-08-29 LAB — COMPLETE METABOLIC PANEL WITH GFR
AG Ratio: 1.5 (calc) (ref 1.0–2.5)
ALT: 25 U/L (ref 6–29)
AST: 29 U/L (ref 10–35)
Albumin: 4 g/dL (ref 3.6–5.1)
Alkaline phosphatase (APISO): 81 U/L (ref 37–153)
BUN: 9 mg/dL (ref 7–25)
CO2: 28 mmol/L (ref 20–32)
Calcium: 9.3 mg/dL (ref 8.6–10.4)
Chloride: 104 mmol/L (ref 98–110)
Creat: 0.58 mg/dL (ref 0.50–1.03)
Globulin: 2.6 g/dL (calc) (ref 1.9–3.7)
Glucose, Bld: 87 mg/dL (ref 65–99)
Potassium: 4.4 mmol/L (ref 3.5–5.3)
Sodium: 141 mmol/L (ref 135–146)
Total Bilirubin: 0.4 mg/dL (ref 0.2–1.2)
Total Protein: 6.6 g/dL (ref 6.1–8.1)
eGFR: 104 mL/min/{1.73_m2} (ref >=60)

## 2020-08-29 LAB — LIPID PANEL W/REFLEX DIRECT LDL
Cholesterol: 218 mg/dL — ABNORMAL HIGH (ref <200)
HDL: 70 mg/dL (ref >=50)
LDL Cholesterol (Calc): 123 mg/dL (calc) — ABNORMAL HIGH
Non-HDL Cholesterol (Calc): 148 mg/dL (calc) — ABNORMAL HIGH (ref <130)
Total CHOL/HDL Ratio: 3.1 (calc) (ref <5.0)
Triglycerides: 137 mg/dL (ref <150)

## 2020-08-29 LAB — TSH: TSH: 2 mIU/L (ref 0.40–4.50)

## 2020-08-29 MED ORDER — LEVOTHYROXINE SODIUM 88 MCG PO TABS: 88.0000 ug | ORAL_TABLET | Freq: Every day | ORAL | 3 refills | Status: DC

## 2020-08-29 NOTE — Progress Notes (Signed)
[name],   Kidney, liver, glucose look good.  Thyroid looks great. Sent refills.  HDL, good cholesterol, looks great.  LDL, bad cholesterol, still not optimal but better than 1 year ago. Your overall 10 year CV risk is still lower but I would consider very low dose statin to help with CV prevention due to age and LDL. Thoughts?

## 2020-08-30 ENCOUNTER — Other Ambulatory Visit: Payer: Self-pay | Admitting: Physician Assistant

## 2020-08-30 DIAGNOSIS — Z1231 Encounter for screening mammogram for malignant neoplasm of breast: Secondary | ICD-10-CM

## 2020-09-03 LAB — HM COLONOSCOPY

## 2020-09-06 ENCOUNTER — Telehealth: Payer: Self-pay

## 2020-09-06 NOTE — Telephone Encounter (Signed)
Medication: Wegovy 0.'5mg'$ /0.3m Prior authorization submitted via CoverMyMeds [place] submission pending

## 2020-09-09 NOTE — Telephone Encounter (Addendum)
Medication: Wegovy 0.5 mg/0.5 mL Prior authorization determination received Medication has been approved Approval dates: 09/06/20-04/06/21  Pt aware via voicemail of approval. Instructed her TCB and schedule an appt for wt check in 1 month Rx was printed for pt.

## 2020-09-11 ENCOUNTER — Other Ambulatory Visit: Payer: Self-pay

## 2020-09-11 ENCOUNTER — Ambulatory Visit (INDEPENDENT_AMBULATORY_CARE_PROVIDER_SITE_OTHER): Payer: BC Managed Care – PPO

## 2020-09-11 DIAGNOSIS — Z1231 Encounter for screening mammogram for malignant neoplasm of breast: Secondary | ICD-10-CM | POA: Diagnosis not present

## 2020-09-13 ENCOUNTER — Encounter: Payer: Self-pay | Admitting: Physician Assistant

## 2020-09-13 DIAGNOSIS — K635 Polyp of colon: Secondary | ICD-10-CM | POA: Insufficient documentation

## 2020-09-13 NOTE — Progress Notes (Signed)
Normal mammogram. Follow up in 1 year.

## 2020-10-18 ENCOUNTER — Encounter: Payer: Self-pay | Admitting: Physician Assistant

## 2020-10-21 MED ORDER — SAXENDA 18 MG/3ML ~~LOC~~ SOPN: 3.0000 mg | PEN_INJECTOR | Freq: Every day | SUBCUTANEOUS | 1 refills | Status: DC

## 2020-11-08 ENCOUNTER — Telehealth: Payer: Self-pay

## 2020-11-08 NOTE — Telephone Encounter (Signed)
Medication: Liraglutide -Weight Management ([place]) 18 MG/3ML SOPN Prior authorization submitted via CoverMyMeds on 11/08/2020 [place] submission pending

## 2020-11-11 NOTE — Telephone Encounter (Signed)
Medication:  Liraglutide -Weight Management ([place]) 18 MG/3ML SOPN Prior authorization determination received Medication has been approved Approval dates: 11/08/2020-03/11/2021  Patient aware via: [employer] aware: Yes Provider aware via this encounter

## 2020-11-12 ENCOUNTER — Other Ambulatory Visit: Payer: Self-pay | Admitting: Physician Assistant

## 2020-11-12 DIAGNOSIS — H60542 Acute eczematoid otitis externa, left ear: Secondary | ICD-10-CM

## 2020-11-18 ENCOUNTER — Telehealth: Payer: Self-pay | Admitting: Neurology

## 2020-11-18 MED ORDER — PEN NEEDLES 32G X 4 MM MISC: 1.0000 | Freq: Every day | 0 refills | Status: DC

## 2020-11-18 NOTE — Telephone Encounter (Signed)
RX sent for pen needles to use with Saxenda

## 2020-11-29 ENCOUNTER — Ambulatory Visit: Payer: BC Managed Care – PPO | Admitting: Physician Assistant

## 2020-12-23 ENCOUNTER — Encounter: Payer: Self-pay | Admitting: Physician Assistant

## 2020-12-23 MED ORDER — WEGOVY 2.4 MG/0.75ML ~~LOC~~ SOAJ: 2.4000 mg | SUBCUTANEOUS | 0 refills | Status: DC

## 2020-12-23 MED ORDER — ONDANSETRON 8 MG PO TBDP: 8.0000 mg | ORAL_TABLET | Freq: Three times a day (TID) | ORAL | 0 refills | Status: AC | PRN

## 2020-12-23 NOTE — Addendum Note (Signed)
Addended by: Donella Stade on: 12/23/2020 03:44 PM   Modules accepted: Orders

## 2021-01-01 ENCOUNTER — Other Ambulatory Visit: Payer: Self-pay

## 2021-01-01 ENCOUNTER — Encounter: Payer: Self-pay | Admitting: Physician Assistant

## 2021-01-01 ENCOUNTER — Ambulatory Visit: Payer: BC Managed Care – PPO | Admitting: Physician Assistant

## 2021-01-01 VITALS — BP 129/66 | HR 88 | Ht 67.0 in | Wt 189.0 lb

## 2021-01-01 DIAGNOSIS — M5030 Other cervical disc degeneration, unspecified cervical region: Secondary | ICD-10-CM

## 2021-01-01 DIAGNOSIS — M549 Dorsalgia, unspecified: Secondary | ICD-10-CM | POA: Diagnosis not present

## 2021-01-01 DIAGNOSIS — M542 Cervicalgia: Secondary | ICD-10-CM | POA: Diagnosis not present

## 2021-01-01 MED ORDER — BACLOFEN 10 MG PO TABS: 10.0000 mg | ORAL_TABLET | Freq: Three times a day (TID) | ORAL | 0 refills | Status: DC

## 2021-01-01 MED ORDER — DICLOFENAC SODIUM 1 % EX GEL: 4.0000 g | Freq: Four times a day (QID) | CUTANEOUS | 1 refills | Status: AC

## 2021-01-01 MED ORDER — TRAMADOL HCL 50 MG PO TABS
50.0000 mg | ORAL_TABLET | Freq: Four times a day (QID) | ORAL | 0 refills | Status: DC | PRN
Start: 2021-01-01 — End: 2021-01-06

## 2021-01-01 NOTE — Patient Instructions (Signed)
Will get set up for PT.  Baclofen at bedtime.  Tramadol as needed.  Voltaren gel.

## 2021-01-01 NOTE — Progress Notes (Signed)
Subjective:    Patient ID: [name], female    DOB: 1960/10/25, 60 y.o.   MRN: [id number]  HPI [name] a 60 yo female with cervical DDD and past hx of cervical disc herniation who presents to the clinic with worsening neck and upper back pain. She feels like the pain has been worsening for the last year and becoming a daily thing causing headaches. She has switched pillows and taking voltaren bid. Nothing is really helping. Not lost any strength. Seems to be bilateral. No new numbness and tingling radiating down. Has constant numbness in the index and middle finger. She has had cervical epidural injections before. Last xray was 2012.   .. Active Ambulatory Problems    Diagnosis Date Noted   Goiter, unspecified 12/01/2006   THYROID NODULE 02/04/2009   B12 DEFICIENCY 11/02/2007   HYPERCHOLESTEROLEMIA 11/03/2005   Major depressive disorder, recurrent episode ([place]) 11/03/2005   VARICOSE VEINS 11/03/2005   EXTERNAL HEMORRHOIDS WITHOUT MENTION COMP 10/24/2007   ALLERGIC RHINITIS 04/06/2006   HERNIA, UNILATERAL INGUINAL, W/O OBST/GNGR 05/06/2006   Diverticulitis of colon (without mention of hemorrhage)(562.11) 11/03/2005   ARTHRITIS, ACROMIOCLAVICULAR 05/08/2009   Acute pain of right knee 09/26/2009   DISC DISEASE, CERVICAL 05/08/2009   BARIATRIC SURGERY STATUS 03/16/2007   ASTHMA, WITH ACUTE EXACERBATION 02/05/2010   Thyromegaly 06/26/2010   Fatigue 06/26/2010   Migraines 06/26/2010   Asthma, mild intermittent 01/01/2014   Depression 01/01/2014   Thyroid activity decreased 01/01/2014   Vitamin D deficiency 01/01/2014   Class 2 obesity due to excess calories without serious comorbidity with body mass index (BMI) of 35.0 to 35.9 in adult 04/29/2015   Adenomatous polyps 09/04/2015   Primary osteoarthritis of both knees 03/25/2016   BMI 32.0-32.9,adult 05/19/2016   Dermatitis of left ear canal 04/08/2017   Nausea 04/23/2017   Drug reaction 04/23/2017   Muscle spasm 04/23/2017    Neck pain 04/23/2017   Overweight (BMI 25.0-29.9) 08/10/2017   Injury of left toe 08/07/2019   Bilateral chronic knee pain 08/07/2019   Class 1 obesity due to excess calories without serious comorbidity with body mass index (BMI) of 32.0 to 32.9 in adult 11/01/2019   Acromioclavicular joint arthritis 11/19/2010   Closed fracture of distal end of left radius 06/04/2014   Contact dermatitis and other eczema due to other chemical products 11/20/2010   Biceps tendonitis 01/02/2011   Diverticulosis of intestine 12/23/2010   History of adenomatous polyp of colon 09/03/2015   History of HPV infection 09/23/2012   Impingement syndrome of shoulder region 12/11/2010   Iron deficiency anemia 62/69/4854   Lichen planus 62/70/3500   Multiple thyroid nodules 09/01/2011   Left knee pain 12/08/2011   Pain in right knee 09/19/2019   Perineal rash in female 11/19/2010   S/P gastric bypass 06/11/2020   Type 2 superior labral anterior-to-posterior (SLAP) tear of shoulder 01/02/2011   Stress at home 08/27/2020   Palpitations 08/27/2020   Chest tightness 08/27/2020   Colon polyp 09/13/2020   Upper back pain 01/01/2021   Resolved Ambulatory Problems    Diagnosis Date Noted   Other chronic sinusitis 12/01/2006   SHOULDER PAIN 02/04/2006   CERVICAL LYMPHADENOPATHY 10/24/2007   Right upper quadrant pain 04/17/2010   VULVAR CANDIDIASIS 10/28/2010   EUSTACHIAN TUBE DYSFUNCTION, RIGHT 08/31/2010   TINNITUS, RIGHT 08/31/2010   ANAL PRURITUS 10/28/2010   Swollen uvula 10/08/2015   Primary osteoarthritis of knee 08/07/2019   Past Medical History:  Diagnosis Date   Asthma  DDD (degenerative disc disease)    Diverticulosis    Hemorrhoids    Hemorrhoids    History of gastric bypass    Thyroid disease       Review of Systems See HPI.     Objective:   Physical Exam        Assessment & Plan:  Marland KitchenMarland KitchenJoeanna was seen today for neck pain.  Diagnoses and all orders for this visit:  [place], CERVICAL -     DG Cervical Spine Complete; Future -     diclofenac Sodium (VOLTAREN) 1 % GEL; Apply 4 g topically 4 (four) times daily. To affected joint. -     traMADol (ULTRAM) 50 MG tablet; Take 1 tablet (50 mg total) by mouth every 6 (six) hours as needed for up to 5 days. -     baclofen (LIORESAL) 10 MG tablet; Take 1 tablet (10 mg total) by mouth 3 (three) times daily. -     Ambulatory referral to Physical Therapy  Upper back pain -     DG Cervical Spine Complete; Future -     diclofenac Sodium (VOLTAREN) 1 % GEL; Apply 4 g topically 4 (four) times daily. To affected joint. -     traMADol (ULTRAM) 50 MG tablet; Take 1 tablet (50 mg total) by mouth every 6 (six) hours as needed for up to 5 days. -     baclofen (LIORESAL) 10 MG tablet; Take 1 tablet (10 mg total) by mouth 3 (three) times daily. -     Ambulatory referral to Physical Therapy  Neck pain -     DG Cervical Spine Complete; Future -     diclofenac Sodium (VOLTAREN) 1 % GEL; Apply 4 g topically 4 (four) times daily. To affected joint. -     traMADol (ULTRAM) 50 MG tablet; Take 1 tablet (50 mg total) by mouth every 6 (six) hours as needed for up to 5 days. -     baclofen (LIORESAL) 10 MG tablet; Take 1 tablet (10 mg total) by mouth 3 (three) times daily. -     Ambulatory referral to Physical Therapy  Last cervical xray 2012.  Ordered new xray.  Referral for PT.  Stop oral voltaren due to hx of gastric bypass.  Voltaren gel sent.  Baclofen at bedtime.  Continue to use good supportive pillow, icy hot, tens unit, massage.  Tramadol as needed for break through pain.  Follow up as needed.

## 2021-01-03 ENCOUNTER — Telehealth: Payer: Self-pay

## 2021-01-03 ENCOUNTER — Ambulatory Visit: Payer: BC Managed Care – PPO | Admitting: Physical Therapy

## 2021-01-03 ENCOUNTER — Encounter: Payer: Self-pay | Admitting: Physician Assistant

## 2021-01-03 NOTE — Telephone Encounter (Signed)
Medication: diclofenac Sodium (VOLTAREN) 1 % GEL Prior authorization submitted via CoverMyMeds on 01/03/2021 PA submission pending

## 2021-01-06 ENCOUNTER — Other Ambulatory Visit: Payer: Self-pay

## 2021-01-06 ENCOUNTER — Encounter: Payer: Self-pay | Admitting: Physical Therapy

## 2021-01-06 ENCOUNTER — Ambulatory Visit (INDEPENDENT_AMBULATORY_CARE_PROVIDER_SITE_OTHER): Payer: BC Managed Care – PPO | Admitting: Physical Therapy

## 2021-01-06 ENCOUNTER — Ambulatory Visit (INDEPENDENT_AMBULATORY_CARE_PROVIDER_SITE_OTHER): Payer: BC Managed Care – PPO

## 2021-01-06 DIAGNOSIS — M542 Cervicalgia: Secondary | ICD-10-CM

## 2021-01-06 DIAGNOSIS — R293 Abnormal posture: Secondary | ICD-10-CM

## 2021-01-06 DIAGNOSIS — M6281 Muscle weakness (generalized): Secondary | ICD-10-CM | POA: Diagnosis not present

## 2021-01-06 DIAGNOSIS — M47812 Spondylosis without myelopathy or radiculopathy, cervical region: Secondary | ICD-10-CM | POA: Diagnosis not present

## 2021-01-06 DIAGNOSIS — M549 Dorsalgia, unspecified: Secondary | ICD-10-CM

## 2021-01-06 DIAGNOSIS — M5030 Other cervical disc degeneration, unspecified cervical region: Secondary | ICD-10-CM | POA: Diagnosis not present

## 2021-01-06 DIAGNOSIS — R252 Cramp and spasm: Secondary | ICD-10-CM

## 2021-01-06 NOTE — Patient Instructions (Signed)

## 2021-01-06 NOTE — Therapy (Signed)
[name] [place], [place], [address] Phone: [phone]   Fax:  [phone]  Physical Therapy Evaluation  Patient Details  Name: [name] MRN: [id number] Date of Birth: 31-Jul-1960 Referring Provider (PT): Donella Stade, [place]   Encounter Date: 01/06/2021   PT End of Session - 01/06/21 1520     Visit Number 1    Number of Visits 12    Date for PT Re-Evaluation 02/17/21    Authorization Type BCBS    PT Start Time 1520    PT Stop Time 1600    PT Time Calculation (min) 40 min    Activity Tolerance Patient tolerated treatment well    Behavior During Therapy [name] for tasks assessed/performed             Past Medical History:  Diagnosis Date   Asthma    mild intermittent   DDD (degenerative disc disease)    c spine   Diverticulosis    Hemorrhoids    Dr Zettie Pho   Hemorrhoids    History of gastric bypass    Thyroid disease     Past Surgical History:  Procedure Laterality Date   APPENDECTOMY     DILATION AND CURETTAGE OF UTERUS     HEEL SPUR SURGERY     R/L   HERNIA REPAIR  2008   R inguinal hernia   lap gaastric bypass     LTCS     X 3    NEUROMA SURGERY     left   REDUCTION MAMMAPLASTY     TOTAL ABDOMINAL HYSTERECTOMY     w/o oophorectomy/ non cancerous    There were no vitals filed for this visit.    Subjective Assessment - 01/06/21 1522     Subjective "Early 2000s I was diagnosed with a bulging disk in my neck. ACDF was not recommended at the time. Performed epidural injections and it improved. Over the years it has continued to worsen." Pt reports she is feeling better today but did note that in the past week/weekend she had increased neck/shoulder pain s/p taking a new shot. Pt not getting enough sleep due to the pain.    Limitations Other (comment)   Sleep   Patient Stated Goals Improve neck and shoulder pain    Currently in Pain? Yes    Pain Score 4     Pain  Location Neck    Pain Orientation Right;Left    Pain Descriptors / Indicators Constant;Aching    Pain Type Chronic pain    Pain Radiating Towards bilat shoulders    Pain Onset More than a month ago    Pain Frequency Constant    Aggravating Factors  Worse in the morning    Pain Relieving Factors Has not found anything    Effect of Pain on Daily Activities Sleep and comfort                [facility] PT Assessment - 01/06/21 0001       Assessment   Medical Diagnosis M50.30 (ICD-10-CM) - Degeneration of cervical intervertebral disc  M54.9 (ICD-10-CM) - Upper back pain  M54.2 (ICD-10-CM) - Neck pain    Referring Provider (PT) Donella Stade, PA-C    Onset Date/Surgical Date --   Early 2000s   Hand Dominance Right      Precautions   Precautions None      Restrictions   Weight Bearing Restrictions No      Balance  Screen   Has the patient fallen in the past 6 months No      [place] residence    Living Arrangements Alone      Prior Function   Vocation Full time employment    Vocation Requirements Nurse educator      ROM / Strength   AROM / PROM / Strength AROM;Strength      AROM   Overall AROM Comments All shoulder AROM WFL; however, noted increased pain with bilat shoulder flexion >140, abduction >110    AROM Assessment Site Cervical    Cervical Flexion 51    Cervical Extension 45    Cervical - Right Side Bend 18    Cervical - Left Side Bend 20    Cervical - Right Rotation 57    Cervical - Left Rotation 57      Strength   Strength Assessment Site Shoulder    Right/Left Shoulder Right;Left    Right Shoulder Flexion 4+/5    Right Shoulder ABduction 4+/5    Right Shoulder Internal Rotation 4/5    Right Shoulder External Rotation 4/5    Left Shoulder Flexion 4+/5    Left Shoulder ABduction 4+/5    Left Shoulder Internal Rotation 4/5    Left Shoulder External Rotation 4/5      Palpation   Spinal mobility Cervical lateral  glides and PA mobs hypomobile bilat    Palpation comment TTP: bilat UTs, cervical paraspinals, suboccipitals                        Objective measurements completed on examination: See above findings.       [facility] Adult PT Treatment/Exercise - 01/06/21 0001       Exercises   Exercises Neck      Neck Exercises: Seated   Neck Retraction 10 reps      Neck Exercises: Stretches   Upper Trapezius Stretch 30 seconds;Right;Left    Levator Stretch 30 seconds;Right;Left              Trigger Point Dry Needling - 01/06/21 0001     Consent Given? Yes    Education Handout Provided Yes    Muscles Treated Head and Neck Upper trapezius;Suboccipitals;Cervical multifidi;Levator scapulae    Upper Trapezius Response Twitch reponse elicited;Palpable increased muscle length    Suboccipitals Response Twitch response elicited;Palpable increased muscle length    Levator Scapulae Response Twitch response elicited;Palpable increased muscle length    Cervical multifidi Response Twitch reponse elicited;Palpable increased muscle length                   PT Education - 01/06/21 1647     Education Details Discussed exam findings, POC, HEP and TPDN    Person(s) Educated Patient    Methods Explanation;Demonstration;Verbal cues;Tactile cues;Handout    Comprehension Verbalized understanding;Returned demonstration;Verbal cues required;Tactile cues required                 PT Long Term Goals - 01/06/21 1653       PT LONG TERM GOAL #1   Title Pt will be independent with advanced HEP    Time 6    Period Weeks    Status New    Target Date 02/17/21      PT LONG TERM GOAL #2   Title Pt will have reduced pain by at least 50% for improved sleep    Time 6    Period Weeks  Status New    Target Date 02/17/21      PT LONG TERM GOAL #3   Title Pt will demonstrate improved cervical lateral flexion by at least 10 deg    Time 6    Period Weeks    Status New    Target  Date 02/17/21      PT LONG TERM GOAL #4   Title Pt will have no pain with any shoulder elevation    Baseline Painful at end ranges    Time 6    Period Weeks    Status New    Target Date 02/17/21                    Plan - 01/06/21 1648     Clinical Impression Statement [name] is a 60 y/o F presenting to OPPT due to complaint of neck pain radiating down to her upper shoulders. PMH significant for bulging disk and DDD. Assessment found pt to have limited cervical ROM with hypomobile cervical lateral glides bilat, forward head with taut paraspinal musculature and decreased posterior neck and shoulder girdle strength. Pt would benefit from PT to improve her pain for increased comfort with sleep, work, and home tasks. [name] interested in TPDN -- provided TPDN today for C-spine.    Personal Factors and Comorbidities Age;Time since onset of injury/illness/exacerbation;Profession    Examination-Activity Limitations Lift;Sleep;Reach Overhead    Examination-Participation Restrictions Occupation    Stability/Clinical Decision Making Evolving/Moderate complexity    Clinical Decision Making Moderate    Rehab Potential Good    PT Frequency 2x / week    PT Duration 6 weeks    PT Treatment/Interventions ADLs/Self Care Home Management;Electrical Stimulation;Moist Heat;Traction;Iontophoresis 4mg /ml Dexamethasone;Neuromuscular re-education;Therapeutic exercise;Therapeutic activities;Functional mobility training;Patient/family education;Manual techniques;Dry needling;Passive range of motion;Taping;Spinal Manipulations;Joint Manipulations    PT Next Visit Plan Assess response to TPDN. Continue TPDN and manual therapy as indicated. Initiate strengthening posterior neck and posterior shoulder girdle.    PT Home Exercise Plan Access Code [email or web address]    Consulted and Agree with Plan of Care Patient             Patient will benefit from skilled therapeutic intervention in order to improve the  following deficits and impairments:  Decreased range of motion, Increased fascial restricitons, Increased muscle spasms, Dizziness, Impaired UE functional use, Pain, Hypomobility, Improper body mechanics, Decreased mobility, Postural dysfunction  Visit Diagnosis: Cervicalgia - Plan: PT plan of care cert/re-cert  Abnormal posture - Plan: PT plan of care cert/re-cert  Muscle weakness (generalized) - Plan: PT plan of care cert/re-cert  Cramp and spasm - Plan: PT plan of care cert/re-cert     Problem List Patient Active Problem List   Diagnosis Date Noted   Upper back pain 01/01/2021   Colon polyp 09/13/2020   Stress at home 08/27/2020   Palpitations 08/27/2020   Chest tightness 08/27/2020   S/P gastric bypass 06/11/2020   Class 1 obesity due to excess calories without serious comorbidity with body mass index (BMI) of 32.0 to 32.9 in adult 11/01/2019   Pain in right knee 09/19/2019   Injury of left toe 08/07/2019   Bilateral chronic knee pain 08/07/2019   Overweight (BMI 25.0-29.9) 08/10/2017   Nausea 04/23/2017   Drug reaction 04/23/2017   Muscle spasm 04/23/2017   Neck pain 04/23/2017   Dermatitis of left ear canal 04/08/2017   BMI 32.0-32.9,adult 05/19/2016   Primary osteoarthritis of both knees 03/25/2016   Adenomatous polyps 09/04/2015   History  of adenomatous polyp of colon 09/03/2015   Class 2 obesity due to excess calories without serious comorbidity with body mass index (BMI) of 35.0 to 35.9 in adult 04/29/2015   Closed fracture of distal end of left radius 06/04/2014   Asthma, mild intermittent 01/01/2014   Depression 01/01/2014   Thyroid activity decreased 01/01/2014   Vitamin D deficiency 15/72/6203   Lichen planus 55/97/4163   History of HPV infection 09/23/2012   Left knee pain 12/08/2011   Multiple thyroid nodules 09/01/2011   Biceps tendonitis 01/02/2011   Type 2 superior labral anterior-to-posterior (SLAP) tear of shoulder 01/02/2011   Diverticulosis of  intestine 12/23/2010   Iron deficiency anemia 12/23/2010   Impingement syndrome of shoulder region 12/11/2010   Contact dermatitis and other eczema due to other chemical products 11/20/2010   Acromioclavicular joint arthritis 11/19/2010   Perineal rash in female 11/19/2010   Thyromegaly 06/26/2010   Fatigue 06/26/2010   Migraines 06/26/2010   ASTHMA, WITH ACUTE EXACERBATION 02/05/2010   Acute pain of right knee 09/26/2009   ARTHRITIS, ACROMIOCLAVICULAR 05/08/2009   DISC DISEASE, CERVICAL 05/08/2009   THYROID NODULE 02/04/2009   B12 DEFICIENCY 11/02/2007   EXTERNAL HEMORRHOIDS WITHOUT MENTION COMP 10/24/2007   BARIATRIC SURGERY STATUS 03/16/2007   Goiter, unspecified 12/01/2006   HERNIA, UNILATERAL INGUINAL, W/O OBST/GNGR 05/06/2006   ALLERGIC RHINITIS 04/06/2006   HYPERCHOLESTEROLEMIA 11/03/2005   Major depressive disorder, recurrent episode ([place]) 11/03/2005   VARICOSE VEINS 11/03/2005   Diverticulitis of colon (without mention of hemorrhage)(562.11) 11/03/2005    [facility] April Gordy Levan, PT, DPT 01/06/2021, [place] [address] [place], [place], [address] Phone: [phone]   Fax:  [phone]  Name: [name] MRN: [id number] Date of Birth: May 27, 1960

## 2021-01-06 NOTE — Telephone Encounter (Signed)
Medication: diclofenac Sodium (VOLTAREN) 1 % GEL Prior authorization determination received Medication has been approved Approval dates: 01/03/2021-01/04/2024  Patient aware via: [place] aware: Yes Provider aware via this encounter

## 2021-01-13 ENCOUNTER — Encounter: Payer: Self-pay | Admitting: Physician Assistant

## 2021-01-13 ENCOUNTER — Ambulatory Visit: Payer: BC Managed Care – PPO | Admitting: Rehabilitative and Restorative Service Providers"

## 2021-01-13 ENCOUNTER — Other Ambulatory Visit: Payer: Self-pay

## 2021-01-13 ENCOUNTER — Encounter: Payer: Self-pay | Admitting: Rehabilitative and Restorative Service Providers"

## 2021-01-13 DIAGNOSIS — M542 Cervicalgia: Secondary | ICD-10-CM | POA: Diagnosis not present

## 2021-01-13 DIAGNOSIS — M6281 Muscle weakness (generalized): Secondary | ICD-10-CM

## 2021-01-13 DIAGNOSIS — R293 Abnormal posture: Secondary | ICD-10-CM

## 2021-01-13 DIAGNOSIS — R252 Cramp and spasm: Secondary | ICD-10-CM

## 2021-01-13 NOTE — Therapy (Signed)
[facility] [place], [place], [address] Phone: [phone]   Fax:  [phone]  Physical Therapy Treatment  Patient Details  Name: [name] MRN: [id number] Date of Birth: 1960/08/25 Referring Provider (PT): Donella Stade, [place]   Encounter Date: 01/13/2021   PT End of Session - 01/13/21 1558     Visit Number 2    Number of Visits 12    Date for PT Re-Evaluation 02/17/21    Authorization Type BCBS    PT Start Time 1558    PT Stop Time 1646    PT Time Calculation (min) 48 min    Activity Tolerance Patient tolerated treatment well             Past Medical History:  Diagnosis Date   Asthma    mild intermittent   DDD (degenerative disc disease)    c spine   Diverticulosis    Hemorrhoids    Dr Zettie Pho   Hemorrhoids    History of gastric bypass    Thyroid disease     Past Surgical History:  Procedure Laterality Date   APPENDECTOMY     DILATION AND CURETTAGE OF UTERUS     HEEL SPUR SURGERY     R/L   HERNIA REPAIR  2008   R inguinal hernia   lap gaastric bypass     LTCS     X 3    NEUROMA SURGERY     left   REDUCTION MAMMAPLASTY     TOTAL ABDOMINAL HYSTERECTOMY     w/o oophorectomy/ non cancerous    There were no vitals filed for this visit.   Subjective Assessment - 01/13/21 1559     Subjective Did well woith DN and had a massage Saturday. Symptoms are not as intense.                               [facility] Adult PT Treatment/Exercise - 01/13/21 0001       Posture/Postural Control   Posture Comments working on posture and alignment engaging posterior shoulder girdle      Self-Care   Other Self-Care Comments  myofacial ball release work; use of swim noodle in sitting      Neck Exercises: Theraband   Scapula Retraction 10 reps;Red    Scapula Retraction Limitations noodle along spine 3 sec hold    Other Theraband Exercises W's x 10 noodle along spine       Neck Exercises: Standing   Neck Retraction 5 reps;5 secs      Neck Exercises: Seated   Neck Retraction 10 reps      Neck Exercises: Supine   Neck Retraction 5 reps;5 secs    Other Supine Exercise noddling yes/nodding no x 5 each      Neck Exercises: Stretches   Upper Trapezius Stretch 30 seconds;Right;Left      Moist Heat Therapy   Number Minutes Moist Heat 10 Minutes    Moist Heat Location Cervical;Shoulder      Manual Therapy   Manual therapy comments skilled palpatioin to assess response to DN and manual work    Joint Mobilization cervical PA mobs pt supine    Soft tissue mobilization deep tissue work through ant/lat/post cervical musculature; upper traps/leveator    Myofascial Release anterior chest    Manual Traction manual cervical traction 10-15 sec hold x 3 reps  Trigger Point Dry Needling - 01/13/21 0001     Consent Given? Yes    Education Handout Provided Previously provided    Dry Needling Comments bilat    Upper Trapezius Response Palpable increased muscle length    Suboccipitals Response Palpable increased muscle length    Cervical multifidi Response Palpable increased muscle length                   PT Education - 01/13/21 1616     Education Details HEP    Person(s) Educated Patient    Methods Demonstration;Tactile cues;Explanation;Verbal cues;Handout    Comprehension Verbalized understanding;Returned demonstration;Verbal cues required;Tactile cues required                 PT Long Term Goals - 01/06/21 1653       PT LONG TERM GOAL #1   Title Pt will be independent with advanced HEP    Time 6    Period Weeks    Status New    Target Date 02/17/21      PT LONG TERM GOAL #2   Title Pt will have reduced pain by at least 50% for improved sleep    Time 6    Period Weeks    Status New    Target Date 02/17/21      PT LONG TERM GOAL #3   Title Pt will demonstrate improved cervical lateral flexion by at least 10  deg    Time 6    Period Weeks    Status New    Target Date 02/17/21      PT LONG TERM GOAL #4   Title Pt will have no pain with any shoulder elevation    Baseline Painful at end ranges    Time 6    Period Weeks    Status New    Target Date 02/17/21                   Plan - 01/13/21 1601     Clinical Impression Statement Good response to initial treatment. Added exercise to address tightness through pecs and initiate posterior shoulder girdle strengthening. Continued with DN and manual work.    Rehab Potential Good    PT Frequency 2x / week    PT Duration 6 weeks    PT Treatment/Interventions ADLs/Self Care Home Management;Electrical Stimulation;Moist Heat;Traction;Iontophoresis 4mg /ml Dexamethasone;Neuromuscular re-education;Therapeutic exercise;Therapeutic activities;Functional mobility training;Patient/family education;Manual techniques;Dry needling;Passive range of motion;Taping;Spinal Manipulations;Joint Manipulations    PT Next Visit Plan Continue TPDN; manual therapy; posterior shoulder girdle strengthening    PT Home Exercise Plan [email or web address]    Consulted and Agree with Plan of Care Patient             Patient will benefit from skilled therapeutic intervention in order to improve the following deficits and impairments:     Visit Diagnosis: Cervicalgia  Abnormal posture  Muscle weakness (generalized)  Cramp and spasm     Problem List Patient Active Problem List   Diagnosis Date Noted   Upper back pain 01/01/2021   Colon polyp 09/13/2020   Stress at home 08/27/2020   Palpitations 08/27/2020   Chest tightness 08/27/2020   S/P gastric bypass 06/11/2020   Class 1 obesity due to excess calories without serious comorbidity with body mass index (BMI) of 32.0 to 32.9 in adult 11/01/2019   Pain in right knee 09/19/2019   Injury of left toe 08/07/2019   Bilateral chronic knee pain 08/07/2019   Overweight (BMI 25.0-29.9) 08/10/2017  Nausea 04/23/2017    Drug reaction 04/23/2017   Muscle spasm 04/23/2017   Neck pain 04/23/2017   Dermatitis of left ear canal 04/08/2017   BMI 32.0-32.9,adult 05/19/2016   Primary osteoarthritis of both knees 03/25/2016   Adenomatous polyps 09/04/2015   History of adenomatous polyp of colon 09/03/2015   Class 2 obesity due to excess calories without serious comorbidity with body mass index (BMI) of 35.0 to 35.9 in adult 04/29/2015   Closed fracture of distal end of left radius 06/04/2014   Asthma, mild intermittent 01/01/2014   Depression 01/01/2014   Thyroid activity decreased 01/01/2014   Vitamin D deficiency 62/70/3500   Lichen planus 93/81/8299   History of HPV infection 09/23/2012   Left knee pain 12/08/2011   Multiple thyroid nodules 09/01/2011   Biceps tendonitis 01/02/2011   Type 2 superior labral anterior-to-posterior (SLAP) tear of shoulder 01/02/2011   Diverticulosis of intestine 12/23/2010   Iron deficiency anemia 12/23/2010   Impingement syndrome of shoulder region 12/11/2010   Contact dermatitis and other eczema due to other chemical products 11/20/2010   Acromioclavicular joint arthritis 11/19/2010   Perineal rash in female 11/19/2010   Thyromegaly 06/26/2010   Fatigue 06/26/2010   Migraines 06/26/2010   ASTHMA, WITH ACUTE EXACERBATION 02/05/2010   Acute pain of right knee 09/26/2009   ARTHRITIS, ACROMIOCLAVICULAR 05/08/2009   DISC DISEASE, CERVICAL 05/08/2009   THYROID NODULE 02/04/2009   B12 DEFICIENCY 11/02/2007   EXTERNAL HEMORRHOIDS WITHOUT MENTION COMP 10/24/2007   BARIATRIC SURGERY STATUS 03/16/2007   Goiter, unspecified 12/01/2006   HERNIA, UNILATERAL INGUINAL, W/O OBST/GNGR 05/06/2006   ALLERGIC RHINITIS 04/06/2006   HYPERCHOLESTEROLEMIA 11/03/2005   Major depressive disorder, recurrent episode ([place]) 11/03/2005   VARICOSE VEINS 11/03/2005   Diverticulitis of colon (without mention of hemorrhage)(562.11) 11/03/2005    Jodene Polyak Nilda Simmer, PT, MPH  01/13/2021, 4:51 PM  [facility] [place] [address] [place], [place], [address] Phone: [phone]   Fax:  [phone]  Name: LAUREN MODISETTE MRN: [id number] Date of Birth: 1960-04-19

## 2021-01-13 NOTE — Patient Instructions (Signed)
Access Code: [email or web address] URL: [email or web address] Date: 01/13/2021 Prepared by: Gillermo Murdoch  Exercises Seated Scapular Retraction - 1 x daily - 7 x weekly - 2 sets - 10 reps Standing Cervical Retraction - 1 x daily - 7 x weekly - 2 sets - 10 reps Seated Upper Trapezius Stretch - 1 x daily - 7 x weekly - 2 sets - 30 hold Seated Levator Scapulae Stretch - 1 x daily - 7 x weekly - 2 sets - 30 sec hold Doorway Pec Stretch at 60 Degrees Abduction - 3 x daily - 7 x weekly - 1 sets - 3 reps Doorway Pec Stretch at 90 Degrees Abduction - 3 x daily - 7 x weekly - 1 sets - 3 reps - 30 seconds hold Doorway Pec Stretch at 120 Degrees Abduction - 3 x daily - 7 x weekly - 1 sets - 3 reps - 30 second hold hold Shoulder External Rotation and Scapular Retraction with Resistance - 2 x daily - 7 x weekly - 3 sets - 10 reps Shoulder External Rotation in 45 Degrees Abduction - 2 x daily - 7 x weekly - 1-2 sets - 10 reps - 3 sec hold

## 2021-01-13 NOTE — Progress Notes (Signed)
Progressive moderate degenerative disc disease throughout all of cervical spine. Anterior disc slip C3 on C4 and C7 or T1 and posterior slip C5 on C6. You need to see ortho or neurosurgeon but you could start with our sports med provider and see if he thinks injections could help.  What are your thoughts?

## 2021-01-15 ENCOUNTER — Telehealth: Payer: Self-pay

## 2021-01-16 ENCOUNTER — Other Ambulatory Visit: Payer: Self-pay

## 2021-01-16 ENCOUNTER — Ambulatory Visit (INDEPENDENT_AMBULATORY_CARE_PROVIDER_SITE_OTHER): Payer: BC Managed Care – PPO | Admitting: Physical Therapy

## 2021-01-16 DIAGNOSIS — R252 Cramp and spasm: Secondary | ICD-10-CM | POA: Diagnosis not present

## 2021-01-16 DIAGNOSIS — M542 Cervicalgia: Secondary | ICD-10-CM

## 2021-01-16 DIAGNOSIS — R293 Abnormal posture: Secondary | ICD-10-CM

## 2021-01-16 DIAGNOSIS — M6281 Muscle weakness (generalized): Secondary | ICD-10-CM | POA: Diagnosis not present

## 2021-01-16 NOTE — Therapy (Signed)
[facility] [place], [place], [address] Phone: [phone]   Fax:  [phone]  Physical Therapy Treatment  Patient Details  Name: [name] MRN: [id number] Date of Birth: [birth year] Referring Provider (PT): Donella Stade, [place]   Encounter Date: 01/16/2021   PT End of Session - 01/16/21 1546     Visit Number 3    Number of Visits 12    Date for PT Re-Evaluation 02/17/21    Authorization Type BCBS    PT Start Time 1540    PT Stop Time 1628    PT Time Calculation (min) 48 min    Activity Tolerance Patient tolerated treatment well    Behavior During Therapy [facility] for tasks assessed/performed             Past Medical History:  Diagnosis Date   Asthma    mild intermittent   DDD (degenerative disc disease)    c spine   Diverticulosis    Hemorrhoids    Dr Zettie Pho   Hemorrhoids    History of gastric bypass    Thyroid disease     Past Surgical History:  Procedure Laterality Date   APPENDECTOMY     DILATION AND CURETTAGE OF UTERUS     HEEL SPUR SURGERY     R/L   HERNIA REPAIR  2008   R inguinal hernia   lap gaastric bypass     LTCS     X 3    NEUROMA SURGERY     left   REDUCTION MAMMAPLASTY     TOTAL ABDOMINAL HYSTERECTOMY     w/o oophorectomy/ non cancerous    There were no vitals filed for this visit.   Subjective Assessment - 01/16/21 1546     Subjective [name] to see Dr. Darene Lamer on 12/30 for possible cortisone. Pt states that she feels that her neck is getting looser but it's still tight in the mornings.    Currently in Pain? Yes    Pain Score 4     Pain Location Neck    Pain Orientation Right;Left    Pain Descriptors / Indicators Aching    Pain Type Chronic pain                               [facility] Adult PT Treatment/Exercise - 01/16/21 0001       Neck Exercises: Machines for Strengthening   UBE (Upper Arm Bike) L3, 3 min forward and 3 min backwards       Neck Exercises: Seated   Other Seated Exercise thoracic extension seated x10      Neck Exercises: Prone   Axial Exension 5 reps    Axial Extension Limitations 5 sec holds    Neck Retraction 10 reps    W Back 10 reps   with neck retraction   Shoulder Extension 10 reps    Shoulder Extension Limitations with neck retraction    Rows 10 reps      Neck Exercises: Stretches   Upper Trapezius Stretch 30 seconds;Right;Left    Levator Stretch 30 seconds;Right;Left    Other Neck Stretches doorway pec stretch 60, 90 x30 sec each      Moist Heat Therapy   Number Minutes Moist Heat 10 Minutes    Moist Heat Location Cervical;Shoulder      Manual Therapy   Manual therapy comments skilled palpatioin to assess response to DN and  manual work    Soft tissue mobilization deep tissue work through ant/lat/post cervical musculature; upper traps/leveator    Myofascial Release anterior chest    Manual Traction manual cervical traction 10-15 sec hold x 3 reps              Trigger Point Dry Needling - 01/16/21 0001     Muscles Treated Head and Neck Splenius capitus;Semispinalis capitus    Suboccipitals Response Palpable increased muscle length;Twitch response elicited    Splenius capitus Response Twitch reponse elicited;Palpable increased muscle length    Semispinalis capitus Response Twitch reponse elicited;Palpable increased muscle length    Cervical multifidi Response Palpable increased muscle length                        PT Long Term Goals - 01/06/21 1653       PT LONG TERM GOAL #1   Title Pt will be independent with advanced HEP    Time 6    Period Weeks    Status New    Target Date 02/17/21      PT LONG TERM GOAL #2   Title Pt will have reduced pain by at least 50% for improved sleep    Time 6    Period Weeks    Status New    Target Date 02/17/21      PT LONG TERM GOAL #3   Title Pt will demonstrate improved cervical lateral flexion by at least 10 deg    Time  6    Period Weeks    Status New    Target Date 02/17/21      PT LONG TERM GOAL #4   Title Pt will have no pain with any shoulder elevation    Baseline Painful at end ranges    Time 6    Period Weeks    Status New    Target Date 02/17/21                   Plan - 01/16/21 1742     Clinical Impression Statement Continued to work on improving neck mobility with DN and manual therapy. Less tightness in UT insertion towards shoulder. Worked on posterior neck and shoulder girdle strengthening in prone this session. Pt tolerated well.    Personal Factors and Comorbidities Age;Time since onset of injury/illness/exacerbation;Profession    Examination-Activity Limitations Lift;Sleep;Reach Overhead    Examination-Participation Restrictions Occupation    Rehab Potential Good    PT Frequency 2x / week    PT Duration 6 weeks    PT Treatment/Interventions ADLs/Self Care Home Management;Electrical Stimulation;Moist Heat;Traction;Iontophoresis 4mg /ml Dexamethasone;Neuromuscular re-education;Therapeutic exercise;Therapeutic activities;Functional mobility training;Patient/family education;Manual techniques;Dry needling;Passive range of motion;Taping;Spinal Manipulations;Joint Manipulations    PT Next Visit Plan Continue TPDN; manual therapy; posterior neck and shoulder girdle strengthening    PT Home Exercise Plan [email or web address]    Consulted and Agree with Plan of Care Patient             Patient will benefit from skilled therapeutic intervention in order to improve the following deficits and impairments:  Decreased range of motion, Increased fascial restricitons, Increased muscle spasms, Dizziness, Impaired UE functional use, Pain, Hypomobility, Improper body mechanics, Decreased mobility, Postural dysfunction  Visit Diagnosis: Cervicalgia  Abnormal posture  Muscle weakness (generalized)  Cramp and spasm     Problem List Patient Active Problem List   Diagnosis Date Noted   Upper  back pain 01/01/2021   Colon polyp 09/13/2020   Stress  at home 08/27/2020   Palpitations 08/27/2020   Chest tightness 08/27/2020   S/P gastric bypass 06/11/2020   Class 1 obesity due to excess calories without serious comorbidity with body mass index (BMI) of 32.0 to 32.9 in adult 11/01/2019   Pain in right knee 09/19/2019   Injury of left toe 08/07/2019   Bilateral chronic knee pain 08/07/2019   Overweight (BMI 25.0-29.9) 08/10/2017   Nausea 04/23/2017   Drug reaction 04/23/2017   Muscle spasm 04/23/2017   Neck pain 04/23/2017   Dermatitis of left ear canal 04/08/2017   BMI 32.0-32.9,adult 05/19/2016   Primary osteoarthritis of both knees 03/25/2016   Adenomatous polyps 09/04/2015   History of adenomatous polyp of colon 09/03/2015   Class 2 obesity due to excess calories without serious comorbidity with body mass index (BMI) of 35.0 to 35.9 in adult 04/29/2015   Closed fracture of distal end of left radius 06/04/2014   Asthma, mild intermittent 01/01/2014   Depression 01/01/2014   Thyroid activity decreased 01/01/2014   Vitamin D deficiency 80/32/1224   Lichen planus 82/50/0370   History of HPV infection 09/23/2012   Left knee pain 12/08/2011   Multiple thyroid nodules 09/01/2011   Biceps tendonitis 01/02/2011   Type 2 superior labral anterior-to-posterior (SLAP) tear of shoulder 01/02/2011   Diverticulosis of intestine 12/23/2010   Iron deficiency anemia 12/23/2010   Impingement syndrome of shoulder region 12/11/2010   Contact dermatitis and other eczema due to other chemical products 11/20/2010   Acromioclavicular joint arthritis 11/19/2010   Perineal rash in female 11/19/2010   Thyromegaly 06/26/2010   Fatigue 06/26/2010   Migraines 06/26/2010   ASTHMA, WITH ACUTE EXACERBATION 02/05/2010   Acute pain of right knee 09/26/2009   ARTHRITIS, ACROMIOCLAVICULAR 05/08/2009   DISC DISEASE, CERVICAL 05/08/2009   THYROID NODULE 02/04/2009   B12 DEFICIENCY 11/02/2007    EXTERNAL HEMORRHOIDS WITHOUT MENTION COMP 10/24/2007   BARIATRIC SURGERY STATUS 03/16/2007   Goiter, unspecified 12/01/2006   HERNIA, UNILATERAL INGUINAL, W/O OBST/GNGR 05/06/2006   ALLERGIC RHINITIS 04/06/2006   HYPERCHOLESTEROLEMIA 11/03/2005   Major depressive disorder, recurrent episode ([place]) 11/03/2005   VARICOSE VEINS 11/03/2005   Diverticulitis of colon (without mention of hemorrhage)(562.11) 11/03/2005    [facility] April Gordy Levan, PT, DPT 01/16/2021, 5:46 PM  [facility] [place], [place], [address] Phone: [phone]   Fax:  [phone]  Name: [name] MRN: [id number] Date of Birth: January 12, 1961

## 2021-01-23 ENCOUNTER — Other Ambulatory Visit: Payer: Self-pay

## 2021-01-23 ENCOUNTER — Ambulatory Visit: Payer: BC Managed Care – PPO | Admitting: Physical Therapy

## 2021-01-23 ENCOUNTER — Other Ambulatory Visit: Payer: Self-pay | Admitting: Physician Assistant

## 2021-01-23 DIAGNOSIS — R252 Cramp and spasm: Secondary | ICD-10-CM

## 2021-01-23 DIAGNOSIS — R293 Abnormal posture: Secondary | ICD-10-CM

## 2021-01-23 DIAGNOSIS — M542 Cervicalgia: Secondary | ICD-10-CM | POA: Diagnosis not present

## 2021-01-23 DIAGNOSIS — M549 Dorsalgia, unspecified: Secondary | ICD-10-CM

## 2021-01-23 DIAGNOSIS — M5030 Other cervical disc degeneration, unspecified cervical region: Secondary | ICD-10-CM

## 2021-01-23 DIAGNOSIS — M6281 Muscle weakness (generalized): Secondary | ICD-10-CM | POA: Diagnosis not present

## 2021-01-23 NOTE — Telephone Encounter (Signed)
Just sent 01/01/2021 #90 no refills, seen same day Please advise.

## 2021-01-23 NOTE — Therapy (Signed)
[name], [place], [address] Phone: [phone]   Fax:  [phone]  Physical Therapy Treatment  Patient Details  Name: [name] MRN: [id number] Date of Birth: Feb 13, 1960 Referring Provider (PT): Donella Stade, [place]   Encounter Date: 01/23/2021   PT End of Session - 01/23/21 0932     Visit Number 4    Number of Visits 12    Date for PT Re-Evaluation 02/17/21    Authorization Type BCBS    PT Start Time 0932    PT Stop Time 1015    PT Time Calculation (min) 43 min    Activity Tolerance Patient tolerated treatment well    Behavior During Therapy [facility] [place] [facility] for tasks assessed/performed             Past Medical History:  Diagnosis Date   Asthma    mild intermittent   DDD (degenerative disc disease)    c spine   Diverticulosis    Hemorrhoids    Dr Zettie Pho   Hemorrhoids    History of gastric bypass    Thyroid disease     Past Surgical History:  Procedure Laterality Date   APPENDECTOMY     DILATION AND CURETTAGE OF UTERUS     HEEL SPUR SURGERY     R/L   HERNIA REPAIR  2008   R inguinal hernia   lap gaastric bypass     LTCS     X 3    NEUROMA SURGERY     left   REDUCTION MAMMAPLASTY     TOTAL ABDOMINAL HYSTERECTOMY     w/o oophorectomy/ non cancerous    There were no vitals filed for this visit.   Subjective Assessment - 01/23/21 0934     Subjective "It hurt yesterday but I was helping my daughter move heavy stuff." Pt states she has an appointment with Dr. Darene Lamer tomorrow for possible cortisone.    Limitations Other (comment)    Patient Stated Goals Improve neck and shoulder pain    Currently in Pain? Yes    Pain Score 4     Pain Location Neck    Pain Orientation Right;Left    Pain Descriptors / Indicators Aching                               [facility] Adult PT Treatment/Exercise - 01/23/21 0001       Neck Exercises: Machines for Strengthening   UBE  (Upper Arm Bike) L3, 3 min forward and 3 min backwards      Neck Exercises: Theraband   Rows 20 reps;Red    Rows Limitations maintaining neck retraction    Other Theraband Exercises W's 2x 10 red tband noodle along spine   maintaining neck retraction   Other Theraband Exercises Bicep curl 2x10 red tband; shoulder flexion 2x10 red tband   maintaining neck retraction, cues to reduce UT overactivity     Neck Exercises: Stretches   Upper Trapezius Stretch 30 seconds;Right;Left    Levator Stretch 30 seconds;Right;Left    Other Neck Stretches doorway pec stretch 60, 90 x30 sec each      Moist Heat Therapy   Number Minutes Moist Heat 10 Minutes    Moist Heat Location Cervical;Shoulder      Manual Therapy   Manual Therapy Passive ROM    Manual therapy comments skilled palpatioin to assess response to DN and manual work  Joint Mobilization cervical side glides and rotation grade II to III    Soft tissue mobilization deep tissue work through ant/lat/post cervical musculature; upper traps/levators    Myofascial Release --    Passive ROM Manual stretch into cervical flexion and into lateral flexion each side    Manual Traction manual cervical traction 10-15 sec hold x 3 reps              Trigger Point Dry Needling - 01/23/21 0001     Consent Given? Yes    Education Handout Provided Previously provided    Muscles Treated Upper Quadrant Rhomboids    Upper Trapezius Response Palpable increased muscle length    Levator Scapulae Response Twitch response elicited;Palpable increased muscle length    Cervical multifidi Response Palpable increased muscle length    Rhomboids Response Twitch response elicited;Palpable increased muscle length                        PT Long Term Goals - 01/06/21 1653       PT LONG TERM GOAL #1   Title Pt will be independent with advanced HEP    Time 6    Period Weeks    Status New    Target Date 02/17/21      PT LONG TERM GOAL #2   Title  Pt will have reduced pain by at least 50% for improved sleep    Time 6    Period Weeks    Status New    Target Date 02/17/21      PT LONG TERM GOAL #3   Title Pt will demonstrate improved cervical lateral flexion by at least 10 deg    Time 6    Period Weeks    Status New    Target Date 02/17/21      PT LONG TERM GOAL #4   Title Pt will have no pain with any shoulder elevation    Baseline Painful at end ranges    Time 6    Period Weeks    Status New    Target Date 02/17/21                   Plan - 01/23/21 1010     Clinical Impression Statement Less tightness noted along suboccipitals and upper C-spine. Worked primarily through mid/lower C-spine and into periscapular muscles with manual therapy, TPDN, and stretches. Continued to progress strengthening posterior neck and shoulder girdle. Focused on decreasing UT/neck activity with shoulder strengthening.    Personal Factors and Comorbidities Age;Time since onset of injury/illness/exacerbation;Profession    Examination-Activity Limitations Lift;Sleep;Reach Overhead    Examination-Participation Restrictions Occupation    Rehab Potential Good    PT Frequency 2x / week    PT Duration 6 weeks    PT Treatment/Interventions ADLs/Self Care Home Management;Electrical Stimulation;Moist Heat;Traction;Iontophoresis 4mg /ml Dexamethasone;Neuromuscular re-education;Therapeutic exercise;Therapeutic activities;Functional mobility training;Patient/family education;Manual techniques;Dry needling;Passive range of motion;Taping;Spinal Manipulations;Joint Manipulations    PT Next Visit Plan Continue TPDN; manual therapy; posterior neck and shoulder girdle strengthening    PT Home Exercise Plan [id number]    Consulted and Agree with Plan of Care Patient             Patient will benefit from skilled therapeutic intervention in order to improve the following deficits and impairments:  Decreased range of motion, Increased fascial restricitons,  Increased muscle spasms, Dizziness, Impaired UE functional use, Pain, Hypomobility, Improper body mechanics, Decreased mobility, Postural dysfunction  Visit Diagnosis: Cervicalgia  Abnormal posture  Muscle weakness (generalized)  Cramp and spasm     Problem List Patient Active Problem List   Diagnosis Date Noted   Upper back pain 01/01/2021   Colon polyp 09/13/2020   Stress at home 08/27/2020   Palpitations 08/27/2020   Chest tightness 08/27/2020   S/P gastric bypass 06/11/2020   Class 1 obesity due to excess calories without serious comorbidity with body mass index (BMI) of 32.0 to 32.9 in adult 11/01/2019   Pain in right knee 09/19/2019   Injury of left toe 08/07/2019   Bilateral chronic knee pain 08/07/2019   Overweight (BMI 25.0-29.9) 08/10/2017   Nausea 04/23/2017   Drug reaction 04/23/2017   Muscle spasm 04/23/2017   Neck pain 04/23/2017   Dermatitis of left ear canal 04/08/2017   BMI 32.0-32.9,adult 05/19/2016   Primary osteoarthritis of both knees 03/25/2016   Adenomatous polyps 09/04/2015   History of adenomatous polyp of colon 09/03/2015   Class 2 obesity due to excess calories without serious comorbidity with body mass index (BMI) of 35.0 to 35.9 in adult 04/29/2015   Closed fracture of distal end of left radius 06/04/2014   Asthma, mild intermittent 01/01/2014   Depression 01/01/2014   Thyroid activity decreased 01/01/2014   Vitamin D deficiency 69/48/5462   Lichen planus 70/35/0093   History of HPV infection 09/23/2012   Left knee pain 12/08/2011   Multiple thyroid nodules 09/01/2011   Biceps tendonitis 01/02/2011   Type 2 superior labral anterior-to-posterior (SLAP) tear of shoulder 01/02/2011   Diverticulosis of intestine 12/23/2010   Iron deficiency anemia 12/23/2010   Impingement syndrome of shoulder region 12/11/2010   Contact dermatitis and other eczema due to other chemical products 11/20/2010   Acromioclavicular joint arthritis 11/19/2010    Perineal rash in female 11/19/2010   Thyromegaly 06/26/2010   Fatigue 06/26/2010   Migraines 06/26/2010   ASTHMA, WITH ACUTE EXACERBATION 02/05/2010   Acute pain of right knee 09/26/2009   ARTHRITIS, ACROMIOCLAVICULAR 05/08/2009   DISC DISEASE, CERVICAL 05/08/2009   THYROID NODULE 02/04/2009   B12 DEFICIENCY 11/02/2007   EXTERNAL HEMORRHOIDS WITHOUT MENTION COMP 10/24/2007   BARIATRIC SURGERY STATUS 03/16/2007   Goiter, unspecified 12/01/2006   HERNIA, UNILATERAL INGUINAL, W/O OBST/GNGR 05/06/2006   ALLERGIC RHINITIS 04/06/2006   HYPERCHOLESTEROLEMIA 11/03/2005   Major depressive disorder, recurrent episode ([place]) 11/03/2005   VARICOSE VEINS 11/03/2005   Diverticulitis of colon (without mention of hemorrhage)(562.11) 11/03/2005    [facility] April Gordy Levan, PT, DPT 01/23/2021, 10:27 AM  [facility] [address] [place], [place], [address] Phone: [phone]   Fax:  [phone]  Name: SUSA BONES MRN: [id number] Date of Birth: 06-May-1960

## 2021-01-24 ENCOUNTER — Ambulatory Visit: Payer: BC Managed Care – PPO | Admitting: Sports Medicine

## 2021-01-24 ENCOUNTER — Encounter: Payer: BC Managed Care – PPO | Admitting: Physical Therapy

## 2021-01-24 DIAGNOSIS — M5030 Other cervical disc degeneration, unspecified cervical region: Secondary | ICD-10-CM | POA: Diagnosis not present

## 2021-01-24 DIAGNOSIS — M542 Cervicalgia: Secondary | ICD-10-CM

## 2021-01-24 MED ORDER — TRAMADOL HCL 50 MG PO TABS
50.0000 mg | ORAL_TABLET | Freq: Three times a day (TID) | ORAL | 0 refills | Status: DC | PRN
Start: 2021-01-24 — End: 2021-04-24

## 2021-01-24 MED ORDER — GABAPENTIN 300 MG PO CAPS: ORAL_CAPSULE | ORAL | 3 refills | Status: DC

## 2021-01-24 NOTE — Assessment & Plan Note (Addendum)
[name] has chronic neck pain, she has multilevel cervical DDD on x-rays, chronic neck pain, present for over a decade, historically has caused right-sided C7 radiculitis. She has done formal physical therapy, greater than 6 weeks, analgesics, tramadol, steroids, muscle relaxers, nothing helping. At this point I do think she is a candidate for cervical epidural, adding a cervical spine MRI, we will switch from baclofen to gabapentin, I will refill her tramadol, I can order the epidural injection as soon as I see the MRI results. Follow-up with me 1 month after injection.

## 2021-01-24 NOTE — Progress Notes (Signed)
° ° °  Procedures performed today:    None.  Independent interpretation of notes and tests performed by another provider:   Cervical spine x-rays personally reviewed, multilevel DDD.  Brief History, Exam, Impression, and Recommendations:    DDD (degenerative disc disease), cervical Sandi has chronic neck pain, she has multilevel cervical DDD on x-rays, chronic neck pain, present for over a decade, historically has caused right-sided C7 radiculitis. She has done formal physical therapy, greater than 6 weeks, analgesics, tramadol, steroids, muscle relaxers, nothing helping. At this point I do think she is a candidate for cervical epidural, adding a cervical spine MRI, we will switch from baclofen to gabapentin, I will refill her tramadol, I can order the epidural injection as soon as I see the MRI results. Follow-up with me 1 month after injection.    ___________________________________________ Gwen Her. Dianah Field, M.D., ABFM., CAQSM. Primary Care and [place] Instructor of [place] [employer] [facility] [employer] [facility]

## 2021-01-28 ENCOUNTER — Other Ambulatory Visit: Payer: Self-pay

## 2021-01-28 ENCOUNTER — Ambulatory Visit
Payer: BC Managed Care – PPO | Attending: Physician Assistant | Admitting: Rehabilitative and Restorative Service Providers"

## 2021-01-28 ENCOUNTER — Encounter: Payer: Self-pay | Admitting: Rehabilitative and Restorative Service Providers"

## 2021-01-28 DIAGNOSIS — M549 Dorsalgia, unspecified: Secondary | ICD-10-CM | POA: Diagnosis not present

## 2021-01-28 DIAGNOSIS — R293 Abnormal posture: Secondary | ICD-10-CM

## 2021-01-28 DIAGNOSIS — R252 Cramp and spasm: Secondary | ICD-10-CM

## 2021-01-28 DIAGNOSIS — M542 Cervicalgia: Secondary | ICD-10-CM

## 2021-01-28 DIAGNOSIS — M5030 Other cervical disc degeneration, unspecified cervical region: Secondary | ICD-10-CM | POA: Diagnosis not present

## 2021-01-28 DIAGNOSIS — M6281 Muscle weakness (generalized): Secondary | ICD-10-CM

## 2021-01-28 NOTE — Therapy (Signed)
[facility] [place], [place], [address] Phone: [phone]   Fax:  [phone]  Physical Therapy Treatment  Patient Details  Name: [name] MRN: [id number] Date of Birth: [birth year] Referring Provider ([name]): Donella Stade, [place]   Encounter Date: 01/28/2021   [name] End of Session - 01/28/21 1025     Visit Number 5    Number of Visits 12    Date for [name] Re-Evaluation 02/17/21    Authorization Type BCBS    [name] Start Time 1014    [name] Stop Time 1104    [name] Time Calculation (min) 50 min    Activity Tolerance Patient tolerated treatment well    Behavior During Therapy [facility] for tasks assessed/performed             Past Medical History:  Diagnosis Date   Asthma    mild intermittent   DDD (degenerative disc disease)    c spine   Diverticulosis    Hemorrhoids    Dr Zettie Pho   Hemorrhoids    History of gastric bypass    Thyroid disease     Past Surgical History:  Procedure Laterality Date   APPENDECTOMY     DILATION AND CURETTAGE OF UTERUS     HEEL SPUR SURGERY     R/L   HERNIA REPAIR  2008   R inguinal hernia   lap gaastric bypass     LTCS     X 3    NEUROMA SURGERY     left   REDUCTION MAMMAPLASTY     TOTAL ABDOMINAL HYSTERECTOMY     w/o oophorectomy/ non cancerous    There were no vitals filed for this visit.   Subjective Assessment - 01/28/21 1014     Subjective The patient reports she has pain in the morning.  She reports "it's more the lightheadedness that bothers me more than anything. My head doesn't feel right."  She reports h/o sinus issues.  She does have a h/o vertigo and feels the lightheadedness is fairly new.  She denies spinning sensation.  Lightheadedness worse when pain is worse.    Patient Stated Goals Improve neck and shoulder pain    Currently in Pain? Yes    Pain Score 3     Pain Location Neck    Pain Orientation Right;Left    Pain Descriptors / Indicators Aching     Pain Type Chronic pain    Pain Radiating Towards shoulders    Pain Onset More than a month ago    Pain Frequency Constant    Aggravating Factors  worse in the morning    Pain Relieving Factors ibuprofen                [facility] [name] Assessment - 01/28/21 1019       Assessment   Medical Diagnosis M50.30 (ICD-10-CM) - Degeneration of cervical intervertebral disc  M54.9 (ICD-10-CM) - Upper back pain  M54.2 (ICD-10-CM) - Neck pain    Referring Provider ([name]) Lavada Mesi                           [facility] [name] Treatment/Exercise - 01/28/21 1019       Posture/Postural Control   Posture Comments working on posture and alignment engaging posterior shoulder girdle      Exercises   Exercises Neck      Neck Exercises: Machines for Strengthening   UBE (  Upper Arm Bike) L2, 3 minutes forward, and 2 minutes backward      Neck Exercises: Theraband   Other Theraband Exercises L's 2x 15 red tband noodle along spine    Other Theraband Exercises T's 2 sets x 10 red tband, with pool noodle      Neck Exercises: Standing   Neck Retraction 5 reps;5 secs      Neck Exercises: Stretches   Upper Trapezius Stretch Right;Left;1 rep;20 seconds    Other Neck Stretches doorway pec stretch 60, 90 x30 sec each      Moist Heat Therapy   Number Minutes Moist Heat 8 Minutes    Moist Heat Location Cervical;Shoulder      Manual Therapy   Manual Therapy Joint mobilization;Soft tissue mobilization    Manual therapy comments skilled palpatioin to assess response to DN and manual work/ **stretching R SCM yields L temporal HA/pain    Joint Mobilization mid and lower cervical PA grade II-III, lateral glides grade II-III mid c-spine    Soft tissue mobilization STM in bilateral multifidi, scalenes, upper trap, cervical paraspinals    Passive ROM manual stretch into flexion and lateral flexion bilaterally    Manual Traction manual cervical traction              Trigger Point Dry  Needling - 01/28/21 1052     Consent Given? Yes    Education Handout Provided Previously provided    Upper Trapezius Response Twitch reponse elicited;Palpable increased muscle length    Levator Scapulae Response Twitch response elicited;Palpable increased muscle length    Splenius capitus Response Palpable increased muscle length    Cervical multifidi Response Palpable increased muscle length                        [name] Long Term Goals - 01/06/21 1653       [name] LONG TERM GOAL #1   Title [name] will be independent with advanced HEP    Time 6    Period Weeks    Status New    Target Date 02/17/21      [name] LONG TERM GOAL #2   Title [name] will have reduced pain by at least 50% for improved sleep    Time 6    Period Weeks    Status New    Target Date 02/17/21      [name] LONG TERM GOAL #3   Title [name] will demonstrate improved cervical lateral flexion by at least 10 deg    Time 6    Period Weeks    Status New    Target Date 02/17/21      [name] LONG TERM GOAL #4   Title [name] will have no pain with any shoulder elevation    Baseline Painful at end ranges    Time 6    Period Weeks    Status New    Target Date 02/17/21                   Plan - 01/28/21 1052     Clinical Impression Statement The patient continues with cervical pain worse in the morning and we discussed using a towel roll to maintain spinal position at night.  Cervical rotation to the R is most painful motion. [name] continuing to progress scapular strengthening and stabilization.    [name] Treatment/Interventions ADLs/Self Care Home Management;Electrical Stimulation;Moist Heat;Traction;Iontophoresis 4mg /ml Dexamethasone;Neuromuscular re-education;Therapeutic exercise;Therapeutic activities;Functional mobility training;Patient/family education;Manual techniques;Dry needling;Passive range of motion;Taping;Spinal Manipulations;Joint Manipulations  [name] Next Visit Plan R scalene dry needling assessment, continue postural  strengthening, scapular stabilization, manual for mobilization    [name] Home Exercise Plan [id number]    Consulted and Agree with Plan of Care Patient             Patient will benefit from skilled therapeutic intervention in order to improve the following deficits and impairments:     Visit Diagnosis: Cervicalgia  Abnormal posture  Muscle weakness (generalized)  Cramp and spasm     Problem List Patient Active Problem List   Diagnosis Date Noted   DDD (degenerative disc disease), cervical 01/24/2021   Upper back pain 01/01/2021   Colon polyp 09/13/2020   Stress at home 08/27/2020   Palpitations 08/27/2020   Chest tightness 08/27/2020   S/P gastric bypass 06/11/2020   Class 1 obesity due to excess calories without serious comorbidity with body mass index (BMI) of 32.0 to 32.9 in adult 11/01/2019   Pain in right knee 09/19/2019   Injury of left toe 08/07/2019   Bilateral chronic knee pain 08/07/2019   Overweight (BMI 25.0-29.9) 08/10/2017   Nausea 04/23/2017   Drug reaction 04/23/2017   Muscle spasm 04/23/2017   Neck pain 04/23/2017   Dermatitis of left ear canal 04/08/2017   BMI 32.0-32.9,adult 05/19/2016   Primary osteoarthritis of both knees 03/25/2016   Adenomatous polyps 09/04/2015   History of adenomatous polyp of colon 09/03/2015   Class 2 obesity due to excess calories without serious comorbidity with body mass index (BMI) of 35.0 to 35.9 in adult 04/29/2015   Closed fracture of distal end of left radius 06/04/2014   Asthma, mild intermittent 01/01/2014   Depression 01/01/2014   Thyroid activity decreased 01/01/2014   Vitamin D deficiency 16/10/9602   Lichen planus 54/09/8117   History of HPV infection 09/23/2012   Left knee pain 12/08/2011   Multiple thyroid nodules 09/01/2011   Biceps tendonitis 01/02/2011   Type 2 superior labral anterior-to-posterior (SLAP) tear of shoulder 01/02/2011   Diverticulosis of intestine 12/23/2010   Iron deficiency anemia  12/23/2010   Impingement syndrome of shoulder region 12/11/2010   Contact dermatitis and other eczema due to other chemical products 11/20/2010   Acromioclavicular joint arthritis 11/19/2010   Perineal rash in female 11/19/2010   Thyromegaly 06/26/2010   Fatigue 06/26/2010   Migraines 06/26/2010   ASTHMA, WITH ACUTE EXACERBATION 02/05/2010   Acute pain of right knee 09/26/2009   ARTHRITIS, ACROMIOCLAVICULAR 05/08/2009   THYROID NODULE 02/04/2009   B12 DEFICIENCY 11/02/2007   EXTERNAL HEMORRHOIDS WITHOUT MENTION COMP 10/24/2007   BARIATRIC SURGERY STATUS 03/16/2007   Goiter, unspecified 12/01/2006   HERNIA, UNILATERAL INGUINAL, W/O OBST/GNGR 05/06/2006   ALLERGIC RHINITIS 04/06/2006   HYPERCHOLESTEROLEMIA 11/03/2005   Major depressive disorder, recurrent episode ([place]) 11/03/2005   VARICOSE VEINS 11/03/2005   Diverticulitis of colon (without mention of hemorrhage)(562.11) 11/03/2005    [name], [name] 01/28/2021, 4:19 PM  [facility] [address] [place], [place], [address] Phone: [phone]   Fax:  [phone]  Name: [name] MRN: [id number] Date of Birth: March 22, 1960

## 2021-01-30 ENCOUNTER — Other Ambulatory Visit: Payer: Self-pay

## 2021-01-30 ENCOUNTER — Ambulatory Visit: Payer: BC Managed Care – PPO | Admitting: Rehabilitative and Restorative Service Providers"

## 2021-01-30 ENCOUNTER — Encounter: Payer: Self-pay | Admitting: Rehabilitative and Restorative Service Providers"

## 2021-01-30 DIAGNOSIS — R293 Abnormal posture: Secondary | ICD-10-CM

## 2021-01-30 DIAGNOSIS — M542 Cervicalgia: Secondary | ICD-10-CM

## 2021-01-30 DIAGNOSIS — M6281 Muscle weakness (generalized): Secondary | ICD-10-CM

## 2021-01-30 DIAGNOSIS — M5030 Other cervical disc degeneration, unspecified cervical region: Secondary | ICD-10-CM | POA: Diagnosis not present

## 2021-01-30 DIAGNOSIS — R252 Cramp and spasm: Secondary | ICD-10-CM

## 2021-01-30 NOTE — Therapy (Signed)
[facility] [place], [place], [address] Phone: [phone]   Fax:  [phone]  Physical Therapy Treatment  Patient Details  Name: [name] MRN: [id number] Date of Birth: [birth year] Referring Provider (PT): Donella Stade, [place]   Encounter Date: 01/30/2021   PT End of Session - 01/30/21 1011     Visit Number 6    Number of Visits 12    Date for PT Re-Evaluation 02/17/21    Authorization Type BCBS    PT Start Time 1011    PT Stop Time 1100    PT Time Calculation (min) 49 min    Activity Tolerance Patient tolerated treatment well             Past Medical History:  Diagnosis Date   Asthma    mild intermittent   DDD (degenerative disc disease)    c spine   Diverticulosis    Hemorrhoids    Dr Zettie Pho   Hemorrhoids    History of gastric bypass    Thyroid disease     Past Surgical History:  Procedure Laterality Date   APPENDECTOMY     DILATION AND CURETTAGE OF UTERUS     HEEL SPUR SURGERY     R/L   HERNIA REPAIR  2008   R inguinal hernia   lap gaastric bypass     LTCS     X 3    NEUROMA SURGERY     left   REDUCTION MAMMAPLASTY     TOTAL ABDOMINAL HYSTERECTOMY     w/o oophorectomy/ non cancerous    There were no vitals filed for this visit.   Subjective Assessment - 01/30/21 1012     Subjective Patient reports that she found a new pillow yesterday and tried it last night. She still woke up hurting but not as bad. She has MRI Saturday and hopes to have ESI next week.    Currently in Pain? Yes    Pain Score 3     Pain Location Neck    Pain Orientation Right;Left    Pain Descriptors / Indicators Aching;Tightness    Pain Type Chronic pain    Pain Radiating Towards shoulders Rt > Lt    Pain Onset More than a month ago    Pain Frequency Intermittent    Aggravating Factors  worse in the morning    Pain Relieving Factors ibuprofen                                [facility] Adult PT Treatment/Exercise - 01/30/21 0001       Neck Exercises: Machines for Strengthening   UBE (Upper Arm Bike) L4, 3 minutes forward, and 2 minutes backward      Neck Exercises: Standing   Neck Retraction 5 reps;5 secs      Neck Exercises: Supine   Other Supine Exercise nodding yes/nodding no x 5 each      Neck Exercises: Stretches   Upper Trapezius Stretch Right;Left;1 rep;20 seconds    Other Neck Stretches doorway pec stretch 60, 90 x30 sec each x 2 reps      Moist Heat Therapy   Number Minutes Moist Heat 10 Minutes    Moist Heat Location Cervical;Shoulder      Manual Therapy   Manual therapy comments skilled palpation to assess response to DN and manual work    Joint Mobilization mid and lower cervical  PA grade II-III, lateral glides grade II-III mid c-spine    Soft tissue mobilization STM in bilateral multifidi, scalenes, upper trap, cervical paraspinals    Myofascial Release anterior chest    Passive ROM manual stretch into flexion and lateral flexion bilaterally    Manual Traction manual cervical traction              Trigger Point Dry Needling - 01/30/21 0001     Consent Given? Yes    Education Handout Provided Previously provided    Suboccipitals Response Palpable increased muscle length    Scalenes Response Palpable increased muscle length    Splenius capitus Response Palpable increased muscle length    Semispinalis capitus Response Palpable increased muscle length    Cervical multifidi Response Palpable increased muscle length                        PT Long Term Goals - 01/06/21 1653       PT LONG TERM GOAL #1   Title Pt will be independent with advanced HEP    Time 6    Period Weeks    Status New    Target Date 02/17/21      PT LONG TERM GOAL #2   Title Pt will have reduced pain by at least 50% for improved sleep    Time 6    Period Weeks    Status New    Target Date 02/17/21       PT LONG TERM GOAL #3   Title Pt will demonstrate improved cervical lateral flexion by at least 10 deg    Time 6    Period Weeks    Status New    Target Date 02/17/21      PT LONG TERM GOAL #4   Title Pt will have no pain with any shoulder elevation    Baseline Painful at end ranges    Time 6    Period Weeks    Status New    Target Date 02/17/21                   Plan - 01/30/21 1017     Clinical Impression Statement Continued increased pain in the morning. Some better with trial of new pillow last night. Note continued muscular tightness in the Rt > Lt cervical musculature. Good response to DN and manual work. Note decreased palpabe tightness following treatment    Rehab Potential Good    PT Frequency 2x / week    PT Duration 6 weeks    PT Treatment/Interventions ADLs/Self Care Home Management;Electrical Stimulation;Moist Heat;Traction;Iontophoresis 4mg /ml Dexamethasone;Neuromuscular re-education;Therapeutic exercise;Therapeutic activities;Functional mobility training;Patient/family education;Manual techniques;Dry needling;Passive range of motion;Taping;Spinal Manipulations;Joint Manipulations    PT Next Visit Plan continue postural strengthening, scapular stabilization, manual for mobilization; continue with DN and manual work through tightness in the cervical musculature    PT Home Exercise Plan [email or web address]    Consulted and Agree with Plan of Care Patient             Patient will benefit from skilled therapeutic intervention in order to improve the following deficits and impairments:     Visit Diagnosis: Cervicalgia  Abnormal posture  Muscle weakness (generalized)  Cramp and spasm     Problem List Patient Active Problem List   Diagnosis Date Noted   DDD (degenerative disc disease), cervical 01/24/2021   Upper back pain 01/01/2021   Colon polyp 09/13/2020   Stress at home 08/27/2020  Palpitations 08/27/2020   Chest tightness 08/27/2020   S/P gastric  bypass 06/11/2020   Class 1 obesity due to excess calories without serious comorbidity with body mass index (BMI) of 32.0 to 32.9 in adult 11/01/2019   Pain in right knee 09/19/2019   Injury of left toe 08/07/2019   Bilateral chronic knee pain 08/07/2019   Overweight (BMI 25.0-29.9) 08/10/2017   Nausea 04/23/2017   Drug reaction 04/23/2017   Muscle spasm 04/23/2017   Neck pain 04/23/2017   Dermatitis of left ear canal 04/08/2017   BMI 32.0-32.9,adult 05/19/2016   Primary osteoarthritis of both knees 03/25/2016   Adenomatous polyps 09/04/2015   History of adenomatous polyp of colon 09/03/2015   Class 2 obesity due to excess calories without serious comorbidity with body mass index (BMI) of 35.0 to 35.9 in adult 04/29/2015   Closed fracture of distal end of left radius 06/04/2014   Asthma, mild intermittent 01/01/2014   Depression 01/01/2014   Thyroid activity decreased 01/01/2014   Vitamin D deficiency 16/10/9602   Lichen planus 54/09/8117   History of HPV infection 09/23/2012   Left knee pain 12/08/2011   Multiple thyroid nodules 09/01/2011   Biceps tendonitis 01/02/2011   Type 2 superior labral anterior-to-posterior (SLAP) tear of shoulder 01/02/2011   Diverticulosis of intestine 12/23/2010   Iron deficiency anemia 12/23/2010   Impingement syndrome of shoulder region 12/11/2010   Contact dermatitis and other eczema due to other chemical products 11/20/2010   Acromioclavicular joint arthritis 11/19/2010   Perineal rash in female 11/19/2010   Thyromegaly 06/26/2010   Fatigue 06/26/2010   Migraines 06/26/2010   ASTHMA, WITH ACUTE EXACERBATION 02/05/2010   Acute pain of right knee 09/26/2009   ARTHRITIS, ACROMIOCLAVICULAR 05/08/2009   THYROID NODULE 02/04/2009   B12 DEFICIENCY 11/02/2007   EXTERNAL HEMORRHOIDS WITHOUT MENTION COMP 10/24/2007   BARIATRIC SURGERY STATUS 03/16/2007   Goiter, unspecified 12/01/2006   HERNIA, UNILATERAL INGUINAL, W/O OBST/GNGR 05/06/2006    ALLERGIC RHINITIS 04/06/2006   HYPERCHOLESTEROLEMIA 11/03/2005   Major depressive disorder, recurrent episode ([place]) 11/03/2005   VARICOSE VEINS 11/03/2005   Diverticulitis of colon (without mention of hemorrhage)(562.11) 11/03/2005    Kaisyn Reinhold Nilda Simmer, PT, MPH  01/30/2021, 11:02 AM  [facility] [place] [address] [place], [place], [address] Phone: [phone]   Fax:  [phone]  Name: [name] MRN: [id number] Date of Birth: [birth year]

## 2021-01-31 ENCOUNTER — Telehealth: Payer: Self-pay

## 2021-01-31 NOTE — Telephone Encounter (Signed)
Medication: traMADol (ULTRAM) 50 MG tablet Prior authorization submitted via CoverMyMeds on 01/31/2021 PA submission pending

## 2021-02-01 ENCOUNTER — Other Ambulatory Visit: Payer: Self-pay

## 2021-02-01 ENCOUNTER — Ambulatory Visit (INDEPENDENT_AMBULATORY_CARE_PROVIDER_SITE_OTHER): Payer: BC Managed Care – PPO

## 2021-02-01 DIAGNOSIS — M5030 Other cervical disc degeneration, unspecified cervical region: Secondary | ICD-10-CM

## 2021-02-01 DIAGNOSIS — M542 Cervicalgia: Secondary | ICD-10-CM

## 2021-02-01 DIAGNOSIS — M5412 Radiculopathy, cervical region: Secondary | ICD-10-CM | POA: Diagnosis not present

## 2021-02-01 DIAGNOSIS — M50121 Cervical disc disorder at C4-C5 level with radiculopathy: Secondary | ICD-10-CM | POA: Diagnosis not present

## 2021-02-01 DIAGNOSIS — M50122 Cervical disc disorder at C5-C6 level with radiculopathy: Secondary | ICD-10-CM

## 2021-02-01 DIAGNOSIS — M50123 Cervical disc disorder at C6-C7 level with radiculopathy: Secondary | ICD-10-CM | POA: Diagnosis not present

## 2021-02-01 DIAGNOSIS — M4722 Other spondylosis with radiculopathy, cervical region: Secondary | ICD-10-CM

## 2021-02-01 DIAGNOSIS — M5013 Cervical disc disorder with radiculopathy, cervicothoracic region: Secondary | ICD-10-CM

## 2021-02-01 NOTE — Telephone Encounter (Signed)
Medication: traMADol (ULTRAM) 50 MG tablet Prior authorization determination received Medication has been approved Approval dates: 01/31/2021-07/31/2021  Patient aware via: [place] aware: Yes Provider aware via this encounter

## 2021-02-02 ENCOUNTER — Encounter: Payer: Self-pay | Admitting: Sports Medicine

## 2021-02-02 DIAGNOSIS — M5030 Other cervical disc degeneration, unspecified cervical region: Secondary | ICD-10-CM

## 2021-02-03 ENCOUNTER — Encounter: Payer: Self-pay | Admitting: Physician Assistant

## 2021-02-03 ENCOUNTER — Other Ambulatory Visit: Payer: Self-pay

## 2021-02-03 ENCOUNTER — Ambulatory Visit: Payer: BC Managed Care – PPO | Admitting: Rehabilitative and Restorative Service Providers"

## 2021-02-03 ENCOUNTER — Encounter: Payer: Self-pay | Admitting: Rehabilitative and Restorative Service Providers"

## 2021-02-03 DIAGNOSIS — M5030 Other cervical disc degeneration, unspecified cervical region: Secondary | ICD-10-CM | POA: Diagnosis not present

## 2021-02-03 DIAGNOSIS — M542 Cervicalgia: Secondary | ICD-10-CM

## 2021-02-03 DIAGNOSIS — R252 Cramp and spasm: Secondary | ICD-10-CM

## 2021-02-03 DIAGNOSIS — M6281 Muscle weakness (generalized): Secondary | ICD-10-CM

## 2021-02-03 DIAGNOSIS — R293 Abnormal posture: Secondary | ICD-10-CM

## 2021-02-03 MED ORDER — WEGOVY 1 MG/0.5ML ~~LOC~~ SOAJ
1.0000 mg | SUBCUTANEOUS | 1 refills | Status: DC
Start: 2021-02-03 — End: 2021-04-24

## 2021-02-03 NOTE — Therapy (Signed)
[place], [place], [address] Phone: [phone]   Fax:  [phone]  Physical Therapy Treatment  Patient Details  Name: [name] MRN: [id number] Date of Birth: Jun 12, 1960 Referring Provider (PT): Donella Stade, [place]   Encounter Date: 02/03/2021   PT End of Session - 02/03/21 1324     Visit Number 7    Number of Visits 12    Date for PT Re-Evaluation 02/17/21    Authorization Type BCBS    PT Start Time 0933    PT Stop Time 1018    PT Time Calculation (min) 45 min    Activity Tolerance Patient tolerated treatment well             Past Medical History:  Diagnosis Date   Asthma    mild intermittent   DDD (degenerative disc disease)    c spine   Diverticulosis    Hemorrhoids    Dr Zettie Pho   Hemorrhoids    History of gastric bypass    Thyroid disease     Past Surgical History:  Procedure Laterality Date   APPENDECTOMY     DILATION AND CURETTAGE OF UTERUS     HEEL SPUR SURGERY     R/L   HERNIA REPAIR  2008   R inguinal hernia   lap gaastric bypass     LTCS     X 3    NEUROMA SURGERY     left   REDUCTION MAMMAPLASTY     TOTAL ABDOMINAL HYSTERECTOMY     w/o oophorectomy/ non cancerous    There were no vitals filed for this visit.   Subjective Assessment - 02/03/21 0938     Subjective Patient reports that her neck is feeling "pretty good". New pillow seems to be helping and the DN is helpful. The MRI was Saturday and she will proceed with ESI as soon as she can get it scheduled.    Currently in Pain? Yes    Pain Score 2     Pain Location Neck    Pain Orientation Left;Right    Pain Descriptors / Indicators Aching;Tightness                               [facility] Adult PT Treatment/Exercise - 02/03/21 0001       Neck Exercises: Machines for Strengthening   UBE (Upper Arm Bike) L4, 2 minutes forward, and 2 minutes backward      Neck Exercises:  Theraband   Rows 10 reps;Green    Rows Limitations bow and arrow x 10 reps each side    Other Theraband Exercises L's 2x 15 red tband noodle along spine      Neck Exercises: Standing   Neck Retraction 5 reps;5 secs      Neck Exercises: Seated   Other Seated Exercise thoracic extension with PT assist into extension - added trunk rotation with coregeous ball T-spine      Neck Exercises: Supine   Other Supine Exercise nodding yes/nodding no x 5 each      Neck Exercises: Stretches   Upper Trapezius Stretch Right;Left;1 rep;20 seconds    Other Neck Stretches doorway pec stretch 60, 90 x30 sec each x 2 reps      Moist Heat Therapy   Number Minutes Moist Heat 10 Minutes    Moist Heat Location Cervical;Shoulder      Manual Therapy  Manual therapy comments skilled palpation to assess response to DN and manual work    Joint Mobilization mid and lower cervical PA grade II-III, lateral glides grade II-III mid c-spine    Soft tissue mobilization STM in bilateral multifidi, scalenes, upper trap, cervical paraspinals              Trigger Point Dry Needling - 02/03/21 0001     Consent Given? Yes    Education Handout Provided Previously provided    Dry Needling Comments bilat    Upper Trapezius Response Palpable increased muscle length    Suboccipitals Response Palpable increased muscle length    Scalenes Response Palpable increased muscle length    Splenius capitus Response Palpable increased muscle length    Semispinalis capitus Response Palpable increased muscle length    Cervical multifidi Response Palpable increased muscle length                   PT Education - 02/03/21 0946     Education Details HEP    Person(s) Educated Patient    Methods Explanation;Demonstration;Tactile cues;Verbal cues;Handout    Comprehension Verbalized understanding;Returned demonstration;Verbal cues required;Tactile cues required                 PT Long Term Goals - 01/06/21 1653        PT LONG TERM GOAL #1   Title Pt will be independent with advanced HEP    Time 6    Period Weeks    Status New    Target Date 02/17/21      PT LONG TERM GOAL #2   Title Pt will have reduced pain by at least 50% for improved sleep    Time 6    Period Weeks    Status New    Target Date 02/17/21      PT LONG TERM GOAL #3   Title Pt will demonstrate improved cervical lateral flexion by at least 10 deg    Time 6    Period Weeks    Status New    Target Date 02/17/21      PT LONG TERM GOAL #4   Title Pt will have no pain with any shoulder elevation    Baseline Painful at end ranges    Time 6    Period Weeks    Status New    Target Date 02/17/21                   Plan - 02/03/21 0940     Clinical Impression Statement Some improvement in neck pain with modifications of sleeping position and DN/manual work/HEP. Note continued muscular tightness in the cervical musculature as well as joint stiffness. Good response to treatment.    Rehab Potential Good    PT Frequency 2x / week    PT Duration 6 weeks    PT Treatment/Interventions ADLs/Self Care Home Management;Electrical Stimulation;Moist Heat;Traction;Iontophoresis 4mg /ml Dexamethasone;Neuromuscular re-education;Therapeutic exercise;Therapeutic activities;Functional mobility training;Patient/family education;Manual techniques;Dry needling;Passive range of motion;Taping;Spinal Manipulations;Joint Manipulations    PT Next Visit Plan continue postural strengthening, scapular stabilization, manual for mobilization; continue with DN and manual work through tightness in the cervical musculature    PT Home Exercise Plan [email or web address]    Consulted and Agree with Plan of Care Patient             Patient will benefit from skilled therapeutic intervention in order to improve the following deficits and impairments:     Visit Diagnosis: Cervicalgia  Abnormal posture  Muscle weakness (generalized)  Cramp and  spasm     Problem List Patient Active Problem List   Diagnosis Date Noted   DDD (degenerative disc disease), cervical 01/24/2021   Upper back pain 01/01/2021   Colon polyp 09/13/2020   Stress at home 08/27/2020   Palpitations 08/27/2020   Chest tightness 08/27/2020   S/P gastric bypass 06/11/2020   Class 1 obesity due to excess calories without serious comorbidity with body mass index (BMI) of 32.0 to 32.9 in adult 11/01/2019   Pain in right knee 09/19/2019   Injury of left toe 08/07/2019   Bilateral chronic knee pain 08/07/2019   Overweight (BMI 25.0-29.9) 08/10/2017   Nausea 04/23/2017   Drug reaction 04/23/2017   Muscle spasm 04/23/2017   Neck pain 04/23/2017   Dermatitis of left ear canal 04/08/2017   BMI 32.0-32.9,adult 05/19/2016   Primary osteoarthritis of both knees 03/25/2016   Adenomatous polyps 09/04/2015   History of adenomatous polyp of colon 09/03/2015   Class 2 obesity due to excess calories without serious comorbidity with body mass index (BMI) of 35.0 to 35.9 in adult 04/29/2015   Closed fracture of distal end of left radius 06/04/2014   Asthma, mild intermittent 01/01/2014   Depression 01/01/2014   Thyroid activity decreased 01/01/2014   Vitamin D deficiency 76/19/5093   Lichen planus 26/71/2458   History of HPV infection 09/23/2012   Left knee pain 12/08/2011   Multiple thyroid nodules 09/01/2011   Biceps tendonitis 01/02/2011   Type 2 superior labral anterior-to-posterior (SLAP) tear of shoulder 01/02/2011   Diverticulosis of intestine 12/23/2010   Iron deficiency anemia 12/23/2010   Impingement syndrome of shoulder region 12/11/2010   Contact dermatitis and other eczema due to other chemical products 11/20/2010   Acromioclavicular joint arthritis 11/19/2010   Perineal rash in female 11/19/2010   Thyromegaly 06/26/2010   Fatigue 06/26/2010   Migraines 06/26/2010   ASTHMA, WITH ACUTE EXACERBATION 02/05/2010   Acute pain of right knee 09/26/2009    ARTHRITIS, ACROMIOCLAVICULAR 05/08/2009   THYROID NODULE 02/04/2009   B12 DEFICIENCY 11/02/2007   EXTERNAL HEMORRHOIDS WITHOUT MENTION COMP 10/24/2007   BARIATRIC SURGERY STATUS 03/16/2007   Goiter, unspecified 12/01/2006   HERNIA, UNILATERAL INGUINAL, W/O OBST/GNGR 05/06/2006   ALLERGIC RHINITIS 04/06/2006   HYPERCHOLESTEROLEMIA 11/03/2005   Major depressive disorder, recurrent episode ([place]) 11/03/2005   VARICOSE VEINS 11/03/2005   Diverticulitis of colon (without mention of hemorrhage)(562.11) 11/03/2005    Elva Breaker Nilda Simmer, PT, MPH 02/03/2021, 10:15 AM  [facility] [place] [address] [place], [place], [address] Phone: [phone]   Fax:  [phone]  Name: [name] MRN: [id number] Date of Birth: 1960/06/03

## 2021-02-03 NOTE — Telephone Encounter (Signed)
For ortho management.

## 2021-02-03 NOTE — Patient Instructions (Signed)
Access Code: [email or web address] URL: [email or web address] Date: 02/03/2021 Prepared by: Gillermo Murdoch  Exercises Seated Scapular Retraction - 1 x daily - 7 x weekly - 2 sets - 10 reps Standing Cervical Retraction - 1 x daily - 7 x weekly - 2 sets - 10 reps Seated Upper Trapezius Stretch - 1 x daily - 7 x weekly - 2 sets - 30 hold Seated Levator Scapulae Stretch - 1 x daily - 7 x weekly - 2 sets - 30 sec hold Doorway Pec Stretch at 60 Degrees Abduction - 3 x daily - 7 x weekly - 1 sets - 3 reps Doorway Pec Stretch at 90 Degrees Abduction - 3 x daily - 7 x weekly - 1 sets - 3 reps - 30 seconds hold Doorway Pec Stretch at 120 Degrees Abduction - 3 x daily - 7 x weekly - 1 sets - 3 reps - 30 second hold hold Shoulder External Rotation and Scapular Retraction with Resistance - 2 x daily - 7 x weekly - 3 sets - 10 reps Shoulder External Rotation in 45 Degrees Abduction - 2 x daily - 7 x weekly - 1-2 sets - 10 reps - 3 sec hold Standing Bilateral Low Shoulder Row with Anchored Resistance - 1 x daily - 7 x weekly - 2 sets - 10 reps Drawing Bow - 1 x daily - 7 x weekly - 1 sets - 10 reps - 3 sec hold

## 2021-02-03 NOTE — Assessment & Plan Note (Signed)
Chronic neck pain, multilevel cervical DDD, historically the symptoms have been a right-sided C7 radiculitis. We updated her MRI, we will proceed with a right C6-C7 interlaminar epidural, she did have some right-sided upper cervical facet arthritis as well, if she does not get sufficient relief from the epidural we will proceed with a right C2-C3 facet injection

## 2021-02-05 ENCOUNTER — Encounter: Payer: Self-pay | Admitting: Rehabilitative and Restorative Service Providers"

## 2021-02-05 ENCOUNTER — Ambulatory Visit: Payer: BC Managed Care – PPO | Admitting: Rehabilitative and Restorative Service Providers"

## 2021-02-05 ENCOUNTER — Other Ambulatory Visit: Payer: Self-pay

## 2021-02-05 DIAGNOSIS — M5030 Other cervical disc degeneration, unspecified cervical region: Secondary | ICD-10-CM | POA: Diagnosis not present

## 2021-02-05 DIAGNOSIS — M6281 Muscle weakness (generalized): Secondary | ICD-10-CM

## 2021-02-05 DIAGNOSIS — M542 Cervicalgia: Secondary | ICD-10-CM

## 2021-02-05 DIAGNOSIS — R293 Abnormal posture: Secondary | ICD-10-CM

## 2021-02-05 DIAGNOSIS — R252 Cramp and spasm: Secondary | ICD-10-CM

## 2021-02-05 NOTE — Therapy (Signed)
[place], [place], [address] Phone: [phone]   Fax:  [phone]  Physical Therapy Treatment  Patient Details  Name: [name] MRN: [id number] Date of Birth: 11/11/1960 Referring Provider (PT): Donella Stade, [place]   Encounter Date: 02/05/2021   PT End of Session - 02/05/21 1017     Visit Number 8    Number of Visits 12    Date for PT Re-Evaluation 02/17/21    Authorization Type BCBS    PT Start Time 1015    PT Stop Time 81   MH end of treatment   PT Time Calculation (min) 45 min    Activity Tolerance Patient tolerated treatment well             Past Medical History:  Diagnosis Date   Asthma    mild intermittent   DDD (degenerative disc disease)    c spine   Diverticulosis    Hemorrhoids    Dr Zettie Pho   Hemorrhoids    History of gastric bypass    Thyroid disease     Past Surgical History:  Procedure Laterality Date   APPENDECTOMY     DILATION AND CURETTAGE OF UTERUS     HEEL SPUR SURGERY     R/L   HERNIA REPAIR  2008   R inguinal hernia   lap gaastric bypass     LTCS     X 3    NEUROMA SURGERY     left   REDUCTION MAMMAPLASTY     TOTAL ABDOMINAL HYSTERECTOMY     w/o oophorectomy/ non cancerous    There were no vitals filed for this visit.   Subjective Assessment - 02/05/21 1018     Subjective Patient reports that she has some soreness from last treatment but is feeling some better. She has an ESI scheduled for tomorrow morning.    Currently in Pain? Yes    Pain Score 2     Pain Location Neck    Pain Orientation Left;Right    Pain Descriptors / Indicators Aching;Tightness    Pain Type Chronic pain                               [facility] Adult PT Treatment/Exercise - 02/05/21 0001       Neck Exercises: Machines for Strengthening   UBE (Upper Arm Bike) L4, 2 minutes forward, and 2 minutes backward      Neck Exercises: Theraband    Rows 10 reps;Green    Rows Limitations bow and arrow x 10 reps each side    Other Theraband Exercises L's 2x 15 red tband noodle along spine      Neck Exercises: Standing   Neck Retraction 5 reps;5 secs      Neck Exercises: Seated   Other Seated Exercise thoracic extension with PT assist into extension - added trunk rotation with coregeous ball T-spine      Neck Exercises: Prone   Axial Exension 10 reps    Axial Extension Limitations 5 sec holds    Neck Retraction 10 reps    W Back 10 reps      Neck Exercises: Stretches   Upper Trapezius Stretch Right;Left;1 rep;20 seconds    Other Neck Stretches doorway pec stretch 60, 90 x30 sec each x 2 reps      Moist Heat Therapy   Number Minutes Moist Heat 10 Minutes  Moist Heat Location Cervical;Shoulder      Manual Therapy   Manual therapy comments skilled palpation to assess response to DN and manual work    Joint Mobilization mid and lower cervical PA grade II-III, lateral glides grade II-III mid c-spine    Soft tissue mobilization STM in bilateral multifidi, scalenes, upper trap, cervical paraspinals              Trigger Point Dry Needling - 02/05/21 0001     Consent Given? Yes    Education Handout Provided Previously provided    Dry Needling Comments bilat    Upper Trapezius Response Palpable increased muscle length    Suboccipitals Response Palpable increased muscle length    Scalenes Response Palpable increased muscle length    Splenius capitus Response Palpable increased muscle length    Cervical multifidi Response Palpable increased muscle length                        PT Long Term Goals - 01/06/21 1653       PT LONG TERM GOAL #1   Title Pt will be independent with advanced HEP    Time 6    Period Weeks    Status New    Target Date 02/17/21      PT LONG TERM GOAL #2   Title Pt will have reduced pain by at least 50% for improved sleep    Time 6    Period Weeks    Status New    Target Date  02/17/21      PT LONG TERM GOAL #3   Title Pt will demonstrate improved cervical lateral flexion by at least 10 deg    Time 6    Period Weeks    Status New    Target Date 02/17/21      PT LONG TERM GOAL #4   Title Pt will have no pain with any shoulder elevation    Baseline Painful at end ranges    Time 6    Period Weeks    Status New    Target Date 02/17/21                   Plan - 02/05/21 1019     Clinical Impression Statement Conitnued gradual improvement with neck pain and tightness. ROM is increasing and muscular tightness is decreasing. Patient is scheduled for [facility] tomorrow. Will continue treatment to focus on postural strengthening and symptomatic treatment as indicated.    Rehab Potential Good    PT Frequency 2x / week    PT Duration 6 weeks    PT Treatment/Interventions ADLs/Self Care Home Management;Electrical Stimulation;Moist Heat;Traction;Iontophoresis 4mg /ml Dexamethasone;Neuromuscular re-education;Therapeutic exercise;Therapeutic activities;Functional mobility training;Patient/family education;Manual techniques;Dry needling;Passive range of motion;Taping;Spinal Manipulations;Joint Manipulations    PT Next Visit Plan continue postural strengthening, scapular stabilization, manual for mobilization; continue with DN and manual work through tightness in the cervical musculature    PT Home Exercise Plan [email or web address]    Consulted and Agree with Plan of Care Patient             Patient will benefit from skilled therapeutic intervention in order to improve the following deficits and impairments:     Visit Diagnosis: Cervicalgia  Abnormal posture  Muscle weakness (generalized)  Cramp and spasm     Problem List Patient Active Problem List   Diagnosis Date Noted   DDD (degenerative disc disease), cervical 01/24/2021   Upper back pain 01/01/2021   Colon  polyp 09/13/2020   Stress at home 08/27/2020   Palpitations 08/27/2020   Chest tightness  08/27/2020   S/P gastric bypass 06/11/2020   Class 1 obesity due to excess calories without serious comorbidity with body mass index (BMI) of 32.0 to 32.9 in adult 11/01/2019   Pain in right knee 09/19/2019   Injury of left toe 08/07/2019   Bilateral chronic knee pain 08/07/2019   Overweight (BMI 25.0-29.9) 08/10/2017   Nausea 04/23/2017   Drug reaction 04/23/2017   Muscle spasm 04/23/2017   Neck pain 04/23/2017   Dermatitis of left ear canal 04/08/2017   BMI 32.0-32.9,adult 05/19/2016   Primary osteoarthritis of both knees 03/25/2016   Adenomatous polyps 09/04/2015   History of adenomatous polyp of colon 09/03/2015   Class 2 obesity due to excess calories without serious comorbidity with body mass index (BMI) of 35.0 to 35.9 in adult 04/29/2015   Closed fracture of distal end of left radius 06/04/2014   Asthma, mild intermittent 01/01/2014   Depression 01/01/2014   Thyroid activity decreased 01/01/2014   Vitamin D deficiency 41/93/7902   Lichen planus 40/97/3532   History of HPV infection 09/23/2012   Left knee pain 12/08/2011   Multiple thyroid nodules 09/01/2011   Biceps tendonitis 01/02/2011   Type 2 superior labral anterior-to-posterior (SLAP) tear of shoulder 01/02/2011   Diverticulosis of intestine 12/23/2010   Iron deficiency anemia 12/23/2010   Impingement syndrome of shoulder region 12/11/2010   Contact dermatitis and other eczema due to other chemical products 11/20/2010   Acromioclavicular joint arthritis 11/19/2010   Perineal rash in female 11/19/2010   Thyromegaly 06/26/2010   Fatigue 06/26/2010   Migraines 06/26/2010   ASTHMA, WITH ACUTE EXACERBATION 02/05/2010   Acute pain of right knee 09/26/2009   ARTHRITIS, ACROMIOCLAVICULAR 05/08/2009   THYROID NODULE 02/04/2009   B12 DEFICIENCY 11/02/2007   EXTERNAL HEMORRHOIDS WITHOUT MENTION COMP 10/24/2007   BARIATRIC SURGERY STATUS 03/16/2007   Goiter, unspecified 12/01/2006   HERNIA, UNILATERAL INGUINAL, W/O  OBST/GNGR 05/06/2006   ALLERGIC RHINITIS 04/06/2006   HYPERCHOLESTEROLEMIA 11/03/2005   Major depressive disorder, recurrent episode ([place]) 11/03/2005   VARICOSE VEINS 11/03/2005   Diverticulitis of colon (without mention of hemorrhage)(562.11) 11/03/2005    Raydell Maners Nilda Simmer, PT, MPH  02/05/2021, 10:55 AM  [facility] [address] [place], [place], [address] Phone: [phone]   Fax:  [phone]  Name: KINDEL ROCHEFORT MRN: [id number] Date of Birth: Feb 09, 1960

## 2021-02-06 ENCOUNTER — Ambulatory Visit
Admission: RE | Admit: 2021-02-06 | Discharge: 2021-02-06 | Disposition: A | Discharge status: Home / Self Care | Payer: BC Managed Care – PPO | Source: Ambulatory Visit | Attending: Sports Medicine | Admitting: Sports Medicine

## 2021-02-06 DIAGNOSIS — M5030 Other cervical disc degeneration, unspecified cervical region: Secondary | ICD-10-CM

## 2021-02-06 MED ORDER — TRIAMCINOLONE ACETONIDE 40 MG/ML IJ SUSP (RADIOLOGY)
60.0000 mg | Freq: Once | INTRAMUSCULAR | Status: AC
  Administered 2021-02-06: 60 mg via EPIDURAL

## 2021-02-06 MED ORDER — IOPAMIDOL (ISOVUE-M 300) INJECTION 61%
1.0000 mL | Freq: Once | INTRAMUSCULAR | Status: AC
  Administered 2021-02-06: 1 mL via EPIDURAL

## 2021-02-06 NOTE — Discharge Instructions (Signed)

## 2021-02-10 ENCOUNTER — Other Ambulatory Visit: Payer: Self-pay

## 2021-02-10 ENCOUNTER — Encounter: Payer: Self-pay | Admitting: Rehabilitative and Restorative Service Providers"

## 2021-02-10 ENCOUNTER — Ambulatory Visit: Payer: BC Managed Care – PPO | Admitting: Rehabilitative and Restorative Service Providers"

## 2021-02-10 DIAGNOSIS — R252 Cramp and spasm: Secondary | ICD-10-CM

## 2021-02-10 DIAGNOSIS — R293 Abnormal posture: Secondary | ICD-10-CM

## 2021-02-10 DIAGNOSIS — M5030 Other cervical disc degeneration, unspecified cervical region: Secondary | ICD-10-CM | POA: Diagnosis not present

## 2021-02-10 DIAGNOSIS — M542 Cervicalgia: Secondary | ICD-10-CM

## 2021-02-10 DIAGNOSIS — M6281 Muscle weakness (generalized): Secondary | ICD-10-CM

## 2021-02-10 NOTE — Therapy (Signed)
[place], [place], [address] Phone: [phone]   Fax:  [phone]  Physical Therapy Treatment  Patient Details  Name: [name] MRN: [id number] Date of Birth: July 26, 1960 Referring Provider (PT): Donella Stade, [place]   Encounter Date: 02/10/2021   PT End of Session - 02/10/21 1012     Visit Number 9    Number of Visits 12    Date for PT Re-Evaluation 02/17/21    PT Start Time 1012    PT Stop Time 1100    PT Time Calculation (min) 48 min    Activity Tolerance Patient tolerated treatment well             Past Medical History:  Diagnosis Date   Asthma    mild intermittent   DDD (degenerative disc disease)    c spine   Diverticulosis    Hemorrhoids    Dr Zettie Pho   Hemorrhoids    History of gastric bypass    Thyroid disease     Past Surgical History:  Procedure Laterality Date   APPENDECTOMY     DILATION AND CURETTAGE OF UTERUS     HEEL SPUR SURGERY     R/L   HERNIA REPAIR  2008   R inguinal hernia   lap gaastric bypass     LTCS     X 3    NEUROMA SURGERY     left   REDUCTION MAMMAPLASTY     TOTAL ABDOMINAL HYSTERECTOMY     w/o oophorectomy/ non cancerous    There were no vitals filed for this visit.   Subjective Assessment - 02/10/21 1013     Subjective ESI last week seemed to help but she may have overdone it over the weekend. She is feeling some light headedness and dizziness. Has a headache 4/10.    Currently in Pain? Yes    Pain Score 3     Pain Location Neck    Pain Orientation Left;Right    Pain Descriptors / Indicators Aching;Tightness                               [facility] Adult PT Treatment/Exercise - 02/10/21 0001       Neck Exercises: Machines for Strengthening   UBE (Upper Arm Bike) L4, 2 minutes forward, and 2 minutes backward      Neck Exercises: Theraband   Rows 10 reps;Green    Rows Limitations bow and arrow x 10 reps  each side    Other Theraband Exercises L's 2x 15 red tband noodle along spine    Other Theraband Exercises lat pull down 10 reps blue TB      Neck Exercises: Standing   Neck Retraction 5 reps;5 secs      Neck Exercises: Seated   Other Seated Exercise thoracic extension with PT assist into extension - added trunk rotation with coregeous ball T-spine      Neck Exercises: Stretches   Upper Trapezius Stretch Right;Left;1 rep;20 seconds    Other Neck Stretches doorway pec stretch 60, 90 x30 sec each x 2 reps      Manual Therapy   Manual therapy comments skilled palpation to assess response to DN and manual work    Joint Mobilization mid and lower cervical PA grade II-III, lateral glides grade II-III mid c-spine    Soft tissue mobilization STM in bilateral multifidi, scalenes, upper trap, cervical paraspinals  Myofascial Release anterior chest    Manual Traction manual cervical traction              Trigger Point Dry Needling - 02/10/21 0001     Consent Given? Yes    Education Handout Provided Previously provided    Dry Needling Comments bilat    Upper Trapezius Response Palpable increased muscle length    Suboccipitals Response Palpable increased muscle length    Scalenes Response Palpable increased muscle length    Cervical multifidi Response Palpable increased muscle length                   PT Education - 02/10/21 1027     Education Details HEP    Person(s) Educated Patient    Methods Explanation;Demonstration;Tactile cues;Verbal cues;Handout    Comprehension Verbalized understanding;Returned demonstration;Verbal cues required;Tactile cues required                 PT Long Term Goals - 01/06/21 1653       PT LONG TERM GOAL #1   Title Pt will be independent with advanced HEP    Time 6    Period Weeks    Status New    Target Date 02/17/21      PT LONG TERM GOAL #2   Title Pt will have reduced pain by at least 50% for improved sleep    Time 6     Period Weeks    Status New    Target Date 02/17/21      PT LONG TERM GOAL #3   Title Pt will demonstrate improved cervical lateral flexion by at least 10 deg    Time 6    Period Weeks    Status New    Target Date 02/17/21      PT LONG TERM GOAL #4   Title Pt will have no pain with any shoulder elevation    Baseline Painful at end ranges    Time 6    Period Weeks    Status New    Target Date 02/17/21                   Plan - 02/10/21 1017     Clinical Impression Statement Good response to ESI but has increased pain and tightness in the Rt > Lt neck.    Rehab Potential Good    PT Frequency 2x / week    PT Duration 6 weeks    PT Treatment/Interventions ADLs/Self Care Home Management;Electrical Stimulation;Moist Heat;Traction;Iontophoresis 4mg /ml Dexamethasone;Neuromuscular re-education;Therapeutic exercise;Therapeutic activities;Functional mobility training;Patient/family education;Manual techniques;Dry needling;Passive range of motion;Taping;Spinal Manipulations;Joint Manipulations    PT Next Visit Plan continue postural strengthening, scapular stabilization, manual for mobilization; continue with DN and manual work through tightness in the cervical musculature    PT Home Exercise Plan [email or web address]    Consulted and Agree with Plan of Care Patient             Patient will benefit from skilled therapeutic intervention in order to improve the following deficits and impairments:     Visit Diagnosis: Cervicalgia  Abnormal posture  Muscle weakness (generalized)  Cramp and spasm     Problem List Patient Active Problem List   Diagnosis Date Noted   DDD (degenerative disc disease), cervical 01/24/2021   Upper back pain 01/01/2021   Colon polyp 09/13/2020   Stress at home 08/27/2020   Palpitations 08/27/2020   Chest tightness 08/27/2020   S/P gastric bypass 06/11/2020   Class 1 obesity due  to excess calories without serious comorbidity with body mass index  (BMI) of 32.0 to 32.9 in adult 11/01/2019   Pain in right knee 09/19/2019   Injury of left toe 08/07/2019   Bilateral chronic knee pain 08/07/2019   Overweight (BMI 25.0-29.9) 08/10/2017   Nausea 04/23/2017   Drug reaction 04/23/2017   Muscle spasm 04/23/2017   Neck pain 04/23/2017   Dermatitis of left ear canal 04/08/2017   BMI 32.0-32.9,adult 05/19/2016   Primary osteoarthritis of both knees 03/25/2016   Adenomatous polyps 09/04/2015   History of adenomatous polyp of colon 09/03/2015   Class 2 obesity due to excess calories without serious comorbidity with body mass index (BMI) of 35.0 to 35.9 in adult 04/29/2015   Closed fracture of distal end of left radius 06/04/2014   Asthma, mild intermittent 01/01/2014   Depression 01/01/2014   Thyroid activity decreased 01/01/2014   Vitamin D deficiency 38/17/7116   Lichen planus 57/90/3833   History of HPV infection 09/23/2012   Left knee pain 12/08/2011   Multiple thyroid nodules 09/01/2011   Biceps tendonitis 01/02/2011   Type 2 superior labral anterior-to-posterior (SLAP) tear of shoulder 01/02/2011   Diverticulosis of intestine 12/23/2010   Iron deficiency anemia 12/23/2010   Impingement syndrome of shoulder region 12/11/2010   Contact dermatitis and other eczema due to other chemical products 11/20/2010   Acromioclavicular joint arthritis 11/19/2010   Perineal rash in female 11/19/2010   Thyromegaly 06/26/2010   Fatigue 06/26/2010   Migraines 06/26/2010   ASTHMA, WITH ACUTE EXACERBATION 02/05/2010   Acute pain of right knee 09/26/2009   ARTHRITIS, ACROMIOCLAVICULAR 05/08/2009   THYROID NODULE 02/04/2009   B12 DEFICIENCY 11/02/2007   EXTERNAL HEMORRHOIDS WITHOUT MENTION COMP 10/24/2007   BARIATRIC SURGERY STATUS 03/16/2007   Goiter, unspecified 12/01/2006   HERNIA, UNILATERAL INGUINAL, W/O OBST/GNGR 05/06/2006   ALLERGIC RHINITIS 04/06/2006   HYPERCHOLESTEROLEMIA 11/03/2005   Major depressive disorder, recurrent episode  ([place]) 11/03/2005   VARICOSE VEINS 11/03/2005   Diverticulitis of colon (without mention of hemorrhage)(562.11) 11/03/2005    Salam [place] Nilda Simmer, PT, MPH  02/10/2021, 11:02 AM  [facility] [address] [place], [place], [address] Phone: [phone]   Fax:  [phone]  Name: SHAWNETTA LEIN MRN: [id number] Date of Birth: 14-Oct-1960

## 2021-02-10 NOTE — Patient Instructions (Signed)
Access Code: [email or web address] URL: [email or web address] Date: 02/10/2021 Prepared by: Gillermo Murdoch  Exercises Seated Scapular Retraction - 1 x daily - 7 x weekly - 2 sets - 10 reps Standing Cervical Retraction - 1 x daily - 7 x weekly - 2 sets - 10 reps Seated Upper Trapezius Stretch - 1 x daily - 7 x weekly - 2 sets - 30 hold Seated Levator Scapulae Stretch - 1 x daily - 7 x weekly - 2 sets - 30 sec hold Doorway Pec Stretch at 60 Degrees Abduction - 3 x daily - 7 x weekly - 1 sets - 3 reps Doorway Pec Stretch at 90 Degrees Abduction - 3 x daily - 7 x weekly - 1 sets - 3 reps - 30 seconds hold Doorway Pec Stretch at 120 Degrees Abduction - 3 x daily - 7 x weekly - 1 sets - 3 reps - 30 second hold hold Shoulder External Rotation and Scapular Retraction with Resistance - 2 x daily - 7 x weekly - 3 sets - 10 reps Shoulder External Rotation in 45 Degrees Abduction - 2 x daily - 7 x weekly - 1-2 sets - 10 reps - 3 sec hold Standing Bilateral Low Shoulder Row with Anchored Resistance - 1 x daily - 7 x weekly - 2 sets - 10 reps Drawing Bow - 1 x daily - 7 x weekly - 1 sets - 10 reps - 3 sec hold Standing Lat Pull Down with Resistance - Elbows Bent - 2 x daily - 7 x weekly - 1 sets - 10 reps - 3 sec hold

## 2021-02-13 ENCOUNTER — Encounter: Payer: Self-pay | Admitting: Rehabilitative and Restorative Service Providers"

## 2021-02-13 ENCOUNTER — Other Ambulatory Visit: Payer: Self-pay

## 2021-02-13 ENCOUNTER — Ambulatory Visit: Payer: BC Managed Care – PPO | Admitting: Rehabilitative and Restorative Service Providers"

## 2021-02-13 DIAGNOSIS — R252 Cramp and spasm: Secondary | ICD-10-CM

## 2021-02-13 DIAGNOSIS — M5030 Other cervical disc degeneration, unspecified cervical region: Secondary | ICD-10-CM | POA: Diagnosis not present

## 2021-02-13 DIAGNOSIS — M6281 Muscle weakness (generalized): Secondary | ICD-10-CM

## 2021-02-13 DIAGNOSIS — R293 Abnormal posture: Secondary | ICD-10-CM

## 2021-02-13 DIAGNOSIS — M542 Cervicalgia: Secondary | ICD-10-CM

## 2021-02-13 NOTE — Therapy (Signed)
[facility] [place], [place], [address] Phone: [phone]   Fax:  [phone]  Physical Therapy Treatment  Patient Details  Name: [name] MRN: [id number] Date of Birth: April 23, 1960 Referring Provider (PT): Donella Stade, [place]   Encounter Date: 02/13/2021   PT End of Session - 02/13/21 1017     Visit Number 10    Number of Visits 12    Date for PT Re-Evaluation 02/17/21    Authorization Type BCBS    PT Start Time 1015    PT Stop Time 1104    PT Time Calculation (min) 49 min    Activity Tolerance Patient tolerated treatment well             Past Medical History:  Diagnosis Date   Asthma    mild intermittent   DDD (degenerative disc disease)    c spine   Diverticulosis    Hemorrhoids    Dr Zettie Pho   Hemorrhoids    History of gastric bypass    Thyroid disease     Past Surgical History:  Procedure Laterality Date   APPENDECTOMY     DILATION AND CURETTAGE OF UTERUS     HEEL SPUR SURGERY     R/L   HERNIA REPAIR  2008   R inguinal hernia   lap gaastric bypass     LTCS     X 3    NEUROMA SURGERY     left   REDUCTION MAMMAPLASTY     TOTAL ABDOMINAL HYSTERECTOMY     w/o oophorectomy/ non cancerous    There were no vitals filed for this visit.   Subjective Assessment - 02/13/21 1017     Subjective Continues to have some discomfort in Lt neck more so than the Rt; but has some intermittent tightness and pain.    Currently in Pain? Yes    Pain Score 2     Pain Location Neck    Pain Orientation Left;Right    Pain Descriptors / Indicators Aching;Tightness    Pain Type Chronic pain    Pain Onset More than a month ago    Pain Frequency Intermittent                               [facility] Adult PT Treatment/Exercise - 02/13/21 0001       Neck Exercises: Machines for Strengthening   UBE (Upper Arm Bike) L5, 2 minutes forward, and 2 minutes backward      Neck  Exercises: Theraband   Rows 10 reps;Green    Rows Limitations bow and arrow x 10 reps each side    Other Theraband Exercises L's 2x 15 red tband noodle along spine    Other Theraband Exercises lat pull down 10 reps blue TB      Neck Exercises: Standing   Neck Retraction 5 reps;5 secs      Neck Exercises: Seated   Other Seated Exercise thoracic extension with PT assist into extension - added trunk rotation with coregeous ball T-spine      Neck Exercises: Stretches   Upper Trapezius Stretch Right;Left;1 rep;20 seconds    Other Neck Stretches doorway pec stretch 60, 90 x30 sec each x 2 reps      Moist Heat Therapy   Number Minutes Moist Heat 10 Minutes    Moist Heat Location Cervical;Shoulder      Manual Therapy   Manual  therapy comments skilled palpation to assess response to DN and manual work    Joint Mobilization mid and lower cervical PA grade II-III, lateral glides grade II-III mid c-spine    Soft tissue mobilization STM in bilateral multifidi, scalenes, upper trap, cervical paraspinals    Myofascial Release anterior chest    Passive ROM manual stretch into flexion and lateral flexion bilaterally    Manual Traction manual cervical traction              Trigger Point Dry Needling - 02/13/21 0001     Consent Given? Yes    Education Handout Provided Previously provided    Dry Needling Comments bilat    Scalenes Response Palpable increased muscle length                        PT Long Term Goals - 01/06/21 1653       PT LONG TERM GOAL #1   Title Pt will be independent with advanced HEP    Time 6    Period Weeks    Status New    Target Date 02/17/21      PT LONG TERM GOAL #2   Title Pt will have reduced pain by at least 50% for improved sleep    Time 6    Period Weeks    Status New    Target Date 02/17/21      PT LONG TERM GOAL #3   Title Pt will demonstrate improved cervical lateral flexion by at least 10 deg    Time 6    Period Weeks     Status New    Target Date 02/17/21      PT LONG TERM GOAL #4   Title Pt will have no pain with any shoulder elevation    Baseline Painful at end ranges    Time 6    Period Weeks    Status New    Target Date 02/17/21                   Plan - 02/13/21 1020     Clinical Impression Statement Gradual progress with neck pain. She has continued muscular tightness in cervical spine Rt > Lt.    Rehab Potential Good    PT Frequency 2x / week    PT Duration 6 weeks    PT Treatment/Interventions ADLs/Self Care Home Management;Electrical Stimulation;Moist Heat;Traction;Iontophoresis 4mg /ml Dexamethasone;Neuromuscular re-education;Therapeutic exercise;Therapeutic activities;Functional mobility training;Patient/family education;Manual techniques;Dry needling;Passive range of motion;Taping;Spinal Manipulations;Joint Manipulations    PT Next Visit Plan continue postural strengthening, scapular stabilization, manual for mobilization; continue with DN and manual work through tightness in the cervical musculature    PT Home Exercise Plan [id number]    Consulted and Agree with Plan of Care Patient             Patient will benefit from skilled therapeutic intervention in order to improve the following deficits and impairments:     Visit Diagnosis: Cervicalgia  Abnormal posture  Muscle weakness (generalized)  Cramp and spasm     Problem List Patient Active Problem List   Diagnosis Date Noted   DDD (degenerative disc disease), cervical 01/24/2021   Upper back pain 01/01/2021   Colon polyp 09/13/2020   Stress at home 08/27/2020   Palpitations 08/27/2020   Chest tightness 08/27/2020   S/P gastric bypass 06/11/2020   Class 1 obesity due to excess calories without serious comorbidity with body mass index (BMI) of 32.0 to 32.9  in adult 11/01/2019   Pain in right knee 09/19/2019   Injury of left toe 08/07/2019   Bilateral chronic knee pain 08/07/2019   Overweight (BMI 25.0-29.9)  08/10/2017   Nausea 04/23/2017   Drug reaction 04/23/2017   Muscle spasm 04/23/2017   Neck pain 04/23/2017   Dermatitis of left ear canal 04/08/2017   BMI 32.0-32.9,adult 05/19/2016   Primary osteoarthritis of both knees 03/25/2016   Adenomatous polyps 09/04/2015   History of adenomatous polyp of colon 09/03/2015   Class 2 obesity due to excess calories without serious comorbidity with body mass index (BMI) of 35.0 to 35.9 in adult 04/29/2015   Closed fracture of distal end of left radius 06/04/2014   Asthma, mild intermittent 01/01/2014   Depression 01/01/2014   Thyroid activity decreased 01/01/2014   Vitamin D deficiency 25/95/6387   Lichen planus 56/43/3295   History of HPV infection 09/23/2012   Left knee pain 12/08/2011   Multiple thyroid nodules 09/01/2011   Biceps tendonitis 01/02/2011   Type 2 superior labral anterior-to-posterior (SLAP) tear of shoulder 01/02/2011   Diverticulosis of intestine 12/23/2010   Iron deficiency anemia 12/23/2010   Impingement syndrome of shoulder region 12/11/2010   Contact dermatitis and other eczema due to other chemical products 11/20/2010   Acromioclavicular joint arthritis 11/19/2010   Perineal rash in female 11/19/2010   Thyromegaly 06/26/2010   Fatigue 06/26/2010   Migraines 06/26/2010   ASTHMA, WITH ACUTE EXACERBATION 02/05/2010   Acute pain of right knee 09/26/2009   ARTHRITIS, ACROMIOCLAVICULAR 05/08/2009   THYROID NODULE 02/04/2009   B12 DEFICIENCY 11/02/2007   EXTERNAL HEMORRHOIDS WITHOUT MENTION COMP 10/24/2007   BARIATRIC SURGERY STATUS 03/16/2007   Goiter, unspecified 12/01/2006   HERNIA, UNILATERAL INGUINAL, W/O OBST/GNGR 05/06/2006   ALLERGIC RHINITIS 04/06/2006   HYPERCHOLESTEROLEMIA 11/03/2005   Major depressive disorder, recurrent episode ([place]) 11/03/2005   VARICOSE VEINS 11/03/2005   Diverticulitis of colon (without mention of hemorrhage)(562.11) 11/03/2005    Ashlynn Gunnels Nilda Simmer, PT, MPH 02/13/2021, 11:00 AM  [facility] [address] [place], [place], [address] Phone: [phone]   Fax:  [phone]  Name: KARIS RILLING MRN: [id number] Date of Birth: [birth year]

## 2021-02-17 ENCOUNTER — Ambulatory Visit: Payer: BC Managed Care – PPO | Admitting: Rehabilitative and Restorative Service Providers"

## 2021-02-20 ENCOUNTER — Ambulatory Visit: Payer: BC Managed Care – PPO | Admitting: Rehabilitative and Restorative Service Providers"

## 2021-02-24 ENCOUNTER — Ambulatory Visit: Payer: BC Managed Care – PPO | Admitting: Sports Medicine

## 2021-02-24 ENCOUNTER — Other Ambulatory Visit: Payer: Self-pay

## 2021-02-24 ENCOUNTER — Encounter: Payer: BC Managed Care – PPO | Admitting: Rehabilitative and Restorative Service Providers"

## 2021-02-24 DIAGNOSIS — M5030 Other cervical disc degeneration, unspecified cervical region: Secondary | ICD-10-CM

## 2021-02-24 NOTE — Assessment & Plan Note (Signed)
This is a very pleasant 61 year old female, she has chronic neck pain with multilevel cervical DDD, historically she had a right C7 radiculitis, we proceeded with her right cervical epidural, this was done at the C7-T1 level by Dr. Jeralyn Ruths, she had a fantastic experience, and has had very good relief, she still has some discomfort right trapezial region, but the majority of her discomfort is gone. She is interested in trying another cervical epidural, she will need to wait until March to schedule this for her new insurance to kick in but I will go ahead and order it, if she does not get fantastic relief from this then I would also then recommend instead of a third epidural we try a right C2-C3 facet joint injection which had significant osteoarthritis on the MRI.

## 2021-02-24 NOTE — Progress Notes (Signed)
° ° °  Procedures performed today:    None.  Independent interpretation of notes and tests performed by another provider:   None.  Brief History, Exam, Impression, and Recommendations:    DDD (degenerative disc disease), cervical This is a very pleasant 61 year old female, she has chronic neck pain with multilevel cervical DDD, historically she had a right C7 radiculitis, we proceeded with Hampton right cervical epidural, this was done at the C7-T1 level by Dr. Jeralyn Ruths, she had a fantastic experience, and has had very good relief, she still has some discomfort right trapezial region, but the majority of Hampton discomfort is gone. She is interested in trying another cervical epidural, she will need to wait until March to schedule this for Hampton new insurance to kick in but I will go ahead and order it, if she does not get fantastic relief from this then I would also then recommend instead of a third epidural we try a right C2-C3 facet joint injection which had significant osteoarthritis on the MRI.    ___________________________________________ [name]. [name], M.D., ABFM., CAQSM. Primary Care and [place] Instructor of [place] [employer] [facility]

## 2021-03-19 ENCOUNTER — Encounter: Payer: Self-pay | Admitting: Physician Assistant

## 2021-03-19 ENCOUNTER — Telehealth: Payer: Self-pay

## 2021-03-19 NOTE — Telephone Encounter (Signed)
Initiated Prior authorization CCQ:[id number] 18MG /3ML pen-injectors Via: Covermymeds Case/Key:[id number] Status: Pending as of 03/19/21 Reason: Notified Pt via: Mychart

## 2021-03-31 ENCOUNTER — Ambulatory Visit: Payer: BC Managed Care – PPO | Admitting: Physician Assistant

## 2021-03-31 ENCOUNTER — Encounter: Payer: Self-pay | Admitting: Physician Assistant

## 2021-03-31 MED ORDER — ALBUTEROL SULFATE HFA 108 (90 BASE) MCG/ACT IN AERS: 2.0000 | INHALATION_SPRAY | Freq: Four times a day (QID) | RESPIRATORY_TRACT | 0 refills | Status: DC | PRN

## 2021-04-11 ENCOUNTER — Ambulatory Visit
Admission: RE | Admit: 2021-04-11 | Discharge: 2021-04-11 | Disposition: A | Discharge status: Home / Self Care | Payer: BC Managed Care – PPO | Source: Ambulatory Visit | Attending: Sports Medicine | Admitting: Sports Medicine

## 2021-04-11 ENCOUNTER — Other Ambulatory Visit: Payer: Self-pay

## 2021-04-11 DIAGNOSIS — M5030 Other cervical disc degeneration, unspecified cervical region: Secondary | ICD-10-CM

## 2021-04-11 MED ORDER — IOPAMIDOL (ISOVUE-M 300) INJECTION 61%
1.0000 mL | Freq: Once | INTRAMUSCULAR | Status: AC
  Administered 2021-04-11: 1 mL via EPIDURAL

## 2021-04-11 MED ORDER — TRIAMCINOLONE ACETONIDE 40 MG/ML IJ SUSP (RADIOLOGY)
60.0000 mg | Freq: Once | INTRAMUSCULAR | Status: AC
  Administered 2021-04-11: 60 mg via EPIDURAL

## 2021-04-11 NOTE — Discharge Instructions (Signed)

## 2021-04-21 ENCOUNTER — Encounter: Payer: Self-pay | Admitting: Physician Assistant

## 2021-04-28 ENCOUNTER — Ambulatory Visit: Payer: BC Managed Care – PPO | Admitting: Physician Assistant

## 2021-04-28 ENCOUNTER — Encounter: Payer: Self-pay | Admitting: Physician Assistant

## 2021-04-28 VITALS — BP 121/51 | HR 78 | Resp 18 | Ht 67.0 in | Wt 193.0 lb

## 2021-04-28 DIAGNOSIS — R5383 Other fatigue: Secondary | ICD-10-CM | POA: Diagnosis not present

## 2021-04-28 DIAGNOSIS — R0683 Snoring: Secondary | ICD-10-CM

## 2021-04-28 DIAGNOSIS — F330 Major depressive disorder, recurrent, mild: Secondary | ICD-10-CM

## 2021-04-28 DIAGNOSIS — E6609 Other obesity due to excess calories: Secondary | ICD-10-CM

## 2021-04-28 DIAGNOSIS — G478 Other sleep disorders: Secondary | ICD-10-CM | POA: Diagnosis not present

## 2021-04-28 DIAGNOSIS — Z6832 Body mass index (BMI) 32.0-32.9, adult: Secondary | ICD-10-CM

## 2021-04-28 MED ORDER — BUPROPION HCL ER (XL) 150 MG PO TB24: 150.0000 mg | ORAL_TABLET | ORAL | 2 refills | Status: DC

## 2021-04-28 MED ORDER — SAXENDA 18 MG/3ML ~~LOC~~ SOPN: 3.0000 mg | PEN_INJECTOR | Freq: Every day | SUBCUTANEOUS | 1 refills | Status: DC

## 2021-04-28 NOTE — Progress Notes (Signed)
? ?Subjective:  ? ? Patient ID: [name], female    DOB: 1960/05/25, 61 y.o.   MRN: [id number] ? ?HPI ?[name] a 61 yo obese female who presents to the clinic for medication adjustment.  ? ?[name] had been on wegovy and having lots of side effects with headache and nausea. [name] would like to continue to work on weight loss with saxenda which [name] tolerated before.  ? ?[name] does endorse some fatigue and depressed mood. [name] has tried anti-depressants in the past and not sure why [name] stopped them. [name] has effexor, trintellix and wellbutrin on med list. No SI/HC. [name] does endorse snoring and waking up not feeling rested.  ?.. ?Active Ambulatory Problems  ?  Diagnosis Date Noted  ? Goiter, unspecified 12/01/2006  ? THYROID NODULE 02/04/2009  ? B12 DEFICIENCY 11/02/2007  ? HYPERCHOLESTEROLEMIA 11/03/2005  ? Major depressive disorder, recurrent episode ([place]) 11/03/2005  ? VARICOSE VEINS 11/03/2005  ? EXTERNAL HEMORRHOIDS WITHOUT MENTION COMP 10/24/2007  ? ALLERGIC RHINITIS 04/06/2006  ? HERNIA, UNILATERAL INGUINAL, W/O OBST/GNGR 05/06/2006  ? Diverticulitis of colon (without mention of hemorrhage)(562.11) 11/03/2005  ? ARTHRITIS, ACROMIOCLAVICULAR 05/08/2009  ? Acute pain of right knee 09/26/2009  ? BARIATRIC SURGERY STATUS 03/16/2007  ? ASTHMA, WITH ACUTE EXACERBATION 02/05/2010  ? Thyromegaly 06/26/2010  ? No energy 06/26/2010  ? Migraines 06/26/2010  ? Asthma, mild intermittent 01/01/2014  ? Depression 01/01/2014  ? Thyroid activity decreased 01/01/2014  ? Vitamin D deficiency 01/01/2014  ? Class 2 obesity due to excess calories without serious comorbidity with body mass index (BMI) of 35.0 to 35.9 in adult 04/29/2015  ? Adenomatous polyps 09/04/2015  ? Primary osteoarthritis of both knees 03/25/2016  ? BMI 32.0-32.9,adult 05/19/2016  ? Dermatitis of left ear canal 04/08/2017  ? Nausea 04/23/2017  ? Drug reaction 04/23/2017  ? Muscle spasm 04/23/2017  ? Neck pain 04/23/2017  ? Overweight (BMI 25.0-29.9) 08/10/2017  ?  Injury of left toe 08/07/2019  ? Bilateral chronic knee pain 08/07/2019  ? Class 1 obesity due to excess calories without serious comorbidity with body mass index (BMI) of 32.0 to 32.9 in adult 11/01/2019  ? Acromioclavicular joint arthritis 11/19/2010  ? Closed fracture of distal end of left radius 06/04/2014  ? Contact dermatitis and other eczema due to other chemical products 11/20/2010  ? Biceps tendonitis 01/02/2011  ? Diverticulosis of intestine 12/23/2010  ? History of adenomatous polyp of colon 09/03/2015  ? History of HPV infection 09/23/2012  ? Impingement syndrome of shoulder region 12/11/2010  ? Iron deficiency anemia 12/23/2010  ? Lichen planus 46/27/0350  ? Multiple thyroid nodules 09/01/2011  ? Left knee pain 12/08/2011  ? Pain in right knee 09/19/2019  ? Perineal rash in female 11/19/2010  ? S/P gastric bypass 06/11/2020  ? Type 2 superior labral anterior-to-posterior (SLAP) tear of shoulder 01/02/2011  ? Stress at home 08/27/2020  ? Palpitations 08/27/2020  ? Chest tightness 08/27/2020  ? Colon polyp 09/13/2020  ? Upper back pain 01/01/2021  ? DDD (degenerative disc disease), cervical 01/24/2021  ? Non-restorative sleep 04/28/2021  ? Snoring 05/04/2021  ? ?Resolved Ambulatory Problems  ?  Diagnosis Date Noted  ? Other chronic sinusitis 12/01/2006  ? SHOULDER PAIN 02/04/2006  ? [place] DISEASE, CERVICAL 05/08/2009  ? CERVICAL LYMPHADENOPATHY 10/24/2007  ? Right upper quadrant pain 04/17/2010  ? VULVAR CANDIDIASIS 10/28/2010  ? EUSTACHIAN TUBE DYSFUNCTION, RIGHT 08/31/2010  ? TINNITUS, RIGHT 08/31/2010  ? ANAL PRURITUS 10/28/2010  ? Swollen uvula 10/08/2015  ? Primary  osteoarthritis of knee 08/07/2019  ? ?Past Medical History:  ?Diagnosis Date  ? Asthma   ? DDD (degenerative disc disease)   ? Diverticulosis   ? Hemorrhoids   ? Hemorrhoids   ? History of gastric bypass   ? Thyroid disease   ? ? ?Review of Systems ? ?  See HPI>  ?Objective:  ? Physical Exam ?Vitals reviewed.  ?Constitutional:   ?    Appearance: Normal appearance. [name] is obese.  ?HENT:  ?   Head: Normocephalic.  ?Neck:  ?   Vascular: No carotid bruit.  ?Cardiovascular:  ?   Rate and Rhythm: Normal rate and regular rhythm.  ?   Pulses: Normal pulses.  ?   Heart sounds: Normal heart sounds.  ?Pulmonary:  ?   Effort: Pulmonary effort is normal.  ?   Breath sounds: Normal breath sounds.  ?Musculoskeletal:  ?   Right lower leg: No edema.  ?   Left lower leg: No edema.  ?Lymphadenopathy:  ?   Cervical: No cervical adenopathy.  ?Neurological:  ?   General: No focal deficit present.  ?   Mental Status: [name] is alert and oriented to person, place, and time.  ?Psychiatric:     ?   Mood and Affect: Mood normal.  ? ?.. ? ?  04/28/2021  ?  4:27 PM 08/27/2020  ? 11:17 AM 08/01/2019  ?  9:21 AM 06/28/2019  ?  8:19 AM 06/22/2018  ?  9:31 AM  ?Depression screen PHQ 2/9  ?Decreased Interest 1 0 0 0 1  ?Down, Depressed, Hopeless 1 1 0 0 2  ?PHQ - 2 Score 2 1 0 0 3  ?Altered sleeping '2 1 1 1 2  '$ ?Tired, decreased energy '3 3 1 3 3  '$ ?Change in appetite 3 2 0 0 3  ?Feeling bad or failure about yourself  0 0 0 0 1  ?Trouble concentrating 2 1 0 0 2  ?Moving slowly or fidgety/restless 0 0 0 0 0  ?Suicidal thoughts 0 0 0 0 0  ?PHQ-9 Score '12 8 2 4 14  '$ ?Difficult doing work/chores Somewhat difficult Somewhat difficult Not difficult at all Somewhat difficult Very difficult  ? ?.. ? ?  04/28/2021  ?  4:27 PM 08/27/2020  ? 11:18 AM 08/01/2019  ?  9:21 AM 06/28/2019  ?  8:18 AM  ?GAD 7 : Generalized Anxiety Score  ?Nervous, Anxious, on Edge 0 0 0 0  ?Control/stop worrying 0 0 0 0  ?Worry too much - different things 0 1 0 0  ?Trouble relaxing 0 0 0 0  ?Restless 0 0 0 0  ?Easily annoyed or irritable '1 1 1 '$ 0  ?Afraid - awful might happen 0 0 0 0  ?Total GAD 7 Score '1 2 1 '$ 0  ?Anxiety Difficulty Not difficult at all Somewhat difficult Not difficult at all Not difficult at all  ? ? ? ? ? ? ? ?   ?Assessment & Plan:  ?..[name] was seen today for weight management  and depression. ? ?Diagnoses and all  orders for this visit: ? ?Class 1 obesity due to excess calories without serious comorbidity with body mass index (BMI) of 32.0 to 32.9 in adult ?-     Liraglutide -Weight Management (SAXENDA) 18 MG/3ML SOPN; Inject 3 mg into the skin daily. 0.6 mg inj subcut daily for 1 week, then incr by 0.6 mg weekly until reaching 3 mg injected subcut daily ?-     buPROPion (WELLBUTRIN XL)  150 MG 24 hr tablet; Take 1 tablet (150 mg total) by mouth every morning. ?-     Home sleep test ? ?No energy ?-     TSH ?-     COMPLETE METABOLIC PANEL WITH GFR ?-     B12 and Folate Panel ?-     VITAMIN D 25 Hydroxy (Vit-D Deficiency, Fractures) ?-     CBC w/Diff/Platelet ?-     Fe+TIBC+Fer ?-     Home sleep test ? ?Mild episode of recurrent major depressive disorder (HCC) ?-     buPROPion (WELLBUTRIN XL) 150 MG 24 hr tablet; Take 1 tablet (150 mg total) by mouth every morning. ? ?Non-restorative sleep ?-     Home sleep test ? ?Snoring ?-     Home sleep test ? ? ?Mancel Parsons stopped due to side effects.  ?Restart saxenda.  ?Discussed titration up.  ?Discussed diet and exercise.  ? ?Pt c/o fatigue. Labs ordered.  ?Sleep study ordered.  ?PHQ increased.  ? ?Added wellbutrin for mood and energy and weight control.  ? ?Follow up in 3 months.  ? ?Spent 30 minutes with patient discussing symptoms, reviewing chart, discussing medication and arranging testing.  ? ?

## 2021-04-28 NOTE — Patient Instructions (Addendum)
Will order sleep study to be done at home ?Will call with labs ?Start saxenda and wellbutrin ?

## 2021-04-29 LAB — IRON,TIBC AND FERRITIN PANEL
%SAT: 23 % (calc) (ref 16–45)
Ferritin: 67 ng/mL (ref 16–232)
Iron: 83 ug/dL (ref 45–160)
TIBC: 368 mcg/dL (calc) (ref 250–450)

## 2021-04-29 LAB — CBC WITH DIFFERENTIAL/PLATELET
Absolute Monocytes: 420 cells/uL (ref 200–950)
Basophils Absolute: 42 cells/uL (ref 0–200)
Basophils Relative: 0.7 %
Eosinophils Absolute: 132 cells/uL (ref 15–500)
Eosinophils Relative: 2.2 %
HCT: 37.8 % (ref 35.0–45.0)
Hemoglobin: 12.7 g/dL (ref 11.7–15.5)
Lymphs Abs: 1872 cells/uL (ref 850–3900)
MCH: 30.4 pg (ref 27.0–33.0)
MCHC: 33.6 g/dL (ref 32.0–36.0)
MCV: 90.4 fL (ref 80.0–100.0)
MPV: 11.9 fL (ref 7.5–12.5)
Monocytes Relative: 7 %
Neutro Abs: 3534 cells/uL (ref 1500–7800)
Neutrophils Relative %: 58.9 %
Platelets: 287 10*3/uL (ref 140–400)
RBC: 4.18 10*6/uL (ref 3.80–5.10)
RDW: 12.9 % (ref 11.0–15.0)
Total Lymphocyte: 31.2 %
WBC: 6 10*3/uL (ref 3.8–10.8)

## 2021-04-29 LAB — COMPLETE METABOLIC PANEL WITH GFR
AG Ratio: 1.7 (calc) (ref 1.0–2.5)
ALT: 21 U/L (ref 6–29)
AST: 30 U/L (ref 10–35)
Albumin: 4.2 g/dL (ref 3.6–5.1)
Alkaline phosphatase (APISO): 81 U/L (ref 37–153)
BUN: 11 mg/dL (ref 7–25)
CO2: 28 mmol/L (ref 20–32)
Calcium: 9.7 mg/dL (ref 8.6–10.4)
Chloride: 105 mmol/L (ref 98–110)
Creat: 0.72 mg/dL (ref 0.50–1.03)
Globulin: 2.5 g/dL (calc) (ref 1.9–3.7)
Glucose, Bld: 80 mg/dL (ref 65–99)
Potassium: 4.8 mmol/L (ref 3.5–5.3)
Sodium: 141 mmol/L (ref 135–146)
Total Bilirubin: 0.3 mg/dL (ref 0.2–1.2)
Total Protein: 6.7 g/dL (ref 6.1–8.1)
eGFR: 96 mL/min/{1.73_m2} (ref >=60)

## 2021-04-29 LAB — TSH: TSH: 2.06 mIU/L (ref 0.40–4.50)

## 2021-04-29 LAB — B12 AND FOLATE PANEL
Folate: 17 ng/mL
Vitamin B-12: 407 pg/mL (ref 200–1100)

## 2021-04-29 LAB — VITAMIN D 25 HYDROXY (VIT D DEFICIENCY, FRACTURES): Vit D, 25-Hydroxy: 36 ng/mL (ref 30–100)

## 2021-04-29 NOTE — Progress Notes (Signed)
Thyroid looks good.  ?Kidney, liver, glucose look great.  ?Vitamin D looks good.  ?Vitamin D normal but could be higher. How much vitamin D are you taking?  ?WBC looks good.  ?Hemoglobin normal range.  ?Normal iron and iron stores.  ? ?No metabolic reason for fatigue.

## 2021-05-01 ENCOUNTER — Other Ambulatory Visit: Payer: Self-pay | Admitting: Physician Assistant

## 2021-05-01 ENCOUNTER — Other Ambulatory Visit (HOSPITAL_BASED_OUTPATIENT_CLINIC_OR_DEPARTMENT_OTHER): Payer: Self-pay

## 2021-05-04 DIAGNOSIS — R0683 Snoring: Secondary | ICD-10-CM | POA: Insufficient documentation

## 2021-05-05 ENCOUNTER — Encounter: Payer: Self-pay | Admitting: Physician Assistant

## 2021-05-11 ENCOUNTER — Other Ambulatory Visit: Payer: Self-pay | Admitting: Sports Medicine

## 2021-05-11 DIAGNOSIS — M5030 Other cervical disc degeneration, unspecified cervical region: Secondary | ICD-10-CM

## 2021-05-13 ENCOUNTER — Encounter: Payer: Self-pay | Admitting: Physician Assistant

## 2021-05-19 NOTE — Addendum Note (Signed)
Addended byAnnamaria Helling on: 05/19/2021 04:49 PM ? ? Modules accepted: Orders ? ?

## 2021-05-20 ENCOUNTER — Other Ambulatory Visit: Payer: Self-pay | Admitting: Physician Assistant

## 2021-05-20 DIAGNOSIS — F330 Major depressive disorder, recurrent, mild: Secondary | ICD-10-CM

## 2021-05-20 DIAGNOSIS — Z6832 Body mass index (BMI) 32.0-32.9, adult: Secondary | ICD-10-CM

## 2021-05-28 ENCOUNTER — Other Ambulatory Visit: Payer: Self-pay | Admitting: Physician Assistant

## 2021-05-28 DIAGNOSIS — E6609 Other obesity due to excess calories: Secondary | ICD-10-CM

## 2021-06-21 ENCOUNTER — Other Ambulatory Visit: Payer: Self-pay | Admitting: Physician Assistant

## 2021-06-21 DIAGNOSIS — F330 Major depressive disorder, recurrent, mild: Secondary | ICD-10-CM

## 2021-06-21 DIAGNOSIS — E6609 Other obesity due to excess calories: Secondary | ICD-10-CM

## 2021-06-24 ENCOUNTER — Ambulatory Visit (HOSPITAL_BASED_OUTPATIENT_CLINIC_OR_DEPARTMENT_OTHER): Payer: BC Managed Care – PPO | Admitting: Internal Medicine

## 2021-06-26 ENCOUNTER — Ambulatory Visit (HOSPITAL_BASED_OUTPATIENT_CLINIC_OR_DEPARTMENT_OTHER): Payer: BC Managed Care – PPO | Attending: Physician Assistant | Admitting: Internal Medicine

## 2021-06-26 VITALS — Ht 67.0 in | Wt 190.0 lb

## 2021-06-26 DIAGNOSIS — R400 Somnolence: Secondary | ICD-10-CM | POA: Diagnosis not present

## 2021-06-26 DIAGNOSIS — G4733 Obstructive sleep apnea (adult) (pediatric): Secondary | ICD-10-CM | POA: Insufficient documentation

## 2021-07-01 ENCOUNTER — Ambulatory Visit (INDEPENDENT_AMBULATORY_CARE_PROVIDER_SITE_OTHER): Payer: BC Managed Care – PPO | Admitting: Physician Assistant

## 2021-07-01 ENCOUNTER — Encounter: Payer: Self-pay | Admitting: Physician Assistant

## 2021-07-01 VITALS — BP 137/85 | HR 78 | Ht 67.0 in | Wt 203.0 lb

## 2021-07-01 DIAGNOSIS — Z6831 Body mass index (BMI) 31.0-31.9, adult: Secondary | ICD-10-CM

## 2021-07-01 DIAGNOSIS — E6609 Other obesity due to excess calories: Secondary | ICD-10-CM

## 2021-07-01 DIAGNOSIS — F330 Major depressive disorder, recurrent, mild: Secondary | ICD-10-CM

## 2021-07-01 MED ORDER — AMBULATORY NON FORMULARY MEDICATION: 0 refills | Status: DC

## 2021-07-01 MED ORDER — AMBULATORY NON FORMULARY MEDICATION: 1 refills | Status: DC

## 2021-07-01 MED ORDER — BUPROPION HCL ER (XL) 300 MG PO TB24: 300.0000 mg | ORAL_TABLET | Freq: Every day | ORAL | 0 refills | Status: DC

## 2021-07-02 ENCOUNTER — Encounter: Payer: Self-pay | Admitting: Physician Assistant

## 2021-07-02 NOTE — Progress Notes (Signed)
   Established Patient Office Visit  Subjective   Patient ID: [name], female    DOB: [birth year]  Age: 61 y.o. MRN: [id number]  Chief Complaint  Patient presents with   Follow-up    HPI [name] a 61 yo obese female with MDD, asthma, s/p gastric bypass who presents to the clinic to follow up on weight.   She has been on saxenda '3mg'$  daily but no improvement in appetite. She is still very hungry. She feels like she acts on her cravings and has no will power. She is more down lately. She did switch jobs and hoping that will help. She would like something else to try for weight.   ROS See HPI.    Objective:     BP 137/85   Pulse 78   Ht '5\' 7"'$  (1.702 m)   Wt 203 lb (92.1 kg)   SpO2 99%   BMI 31.79 kg/m  BP Readings from Last 3 Encounters:  07/01/21 137/85  04/28/21 (!) 121/51  04/11/21 129/74   Wt Readings from Last 3 Encounters:  07/01/21 203 lb (92.1 kg)  06/26/21 190 lb (86.2 kg)  06/24/21 190 lb (86.2 kg)      Physical Exam Vitals reviewed.  Constitutional:      Appearance: Normal appearance. She is obese.  HENT:     Head: Normocephalic.  Cardiovascular:     Rate and Rhythm: Normal rate and regular rhythm.     Pulses: Normal pulses.     Heart sounds: Normal heart sounds.  Pulmonary:     Effort: Pulmonary effort is normal.     Breath sounds: Normal breath sounds.  Neurological:     Mental Status: She is alert and oriented to person, place, and time.  Psychiatric:        Mood and Affect: Mood normal.       Assessment & Plan:  Marland KitchenMarland KitchenMasiel was seen today for follow-up.  Diagnoses and all orders for this visit:  Class 1 obesity due to excess calories without serious comorbidity with body mass index (BMI) of 31.0 to 31.9 in adult -     AMBULATORY NON FORMULARY MEDICATION; Semaglutide 2.'5mg'$  with pyridoxine '10mg'$  per mL  Inject .'3mg'$  subcutaneously once weekly for 4 weeks then increase to next dose -     AMBULATORY NON FORMULARY MEDICATION; Semaglutide 2.'5mg'$   with pyridoxine '10mg'$  per mL Inject .'6mg'$  once weekly -     buPROPion (WELLBUTRIN XL) 300 MG 24 hr tablet; Take 1 tablet (300 mg total) by mouth daily.  Mild episode of recurrent major depressive disorder (HCC) -     buPROPion (WELLBUTRIN XL) 300 MG 24 hr tablet; Take 1 tablet (300 mg total) by mouth daily.   For mood and hopefully help with appetite wellbutrin increased to '300mg'$  daily ? If compound semaglutide with B6 could be more easily tolerated and not as much nausea Sent lowest dose Discussed slow titration up Goal 150 minutes of exercise a week Discussed nutrition  Follow up in 3 months  Return in about 3 months (around 10/01/2021).    Iran Planas, PA-C

## 2021-07-06 DIAGNOSIS — R5383 Other fatigue: Secondary | ICD-10-CM

## 2021-07-06 NOTE — Procedures (Signed)
     Patient Name: [name], [name] Date: 06/26/2021 Gender: Female D.O.B: 1960-09-06 Age (years): 61 Referring Provider: Iran Planas Height (inches): 68 Interpreting Physician: Baird Lyons MD, ABSM Weight (lbs): 190 RPSGT: Jacolyn Reedy BMI: 30 MRN: [id number] Neck Size: 13.75  CLINICAL INFORMATION Sleep Study Type: HST Indication for sleep study: excessive somnolence Epworth Sleepiness Score: 13  SLEEP STUDY TECHNIQUE A multi-channel overnight portable sleep study was performed. The channels recorded were: nasal airflow, thoracic respiratory movement, and oxygen saturation with a pulse oximetry. Snoring was also monitored.  MEDICATIONS Patient self administered medications include: none reported.  SLEEP ARCHITECTURE Patient was studied for 371.5 minutes. The sleep efficiency was 100.0 % and the patient was supine for 0%. The arousal index was 0.0 per hour.  RESPIRATORY PARAMETERS The overall AHI was 14.5 per hour, with a central apnea index of 0 per hour. The oxygen nadir was 84% during sleep.  CARDIAC DATA Mean heart rate during sleep was 70.7 bpm.  IMPRESSIONS - Mild to moderate obstructive sleep apnea occurred during this study (AHI = 14.5/h). - Moderate oxygen desaturation was noted during this study (Min O2 = 84%). Mean 95% - Patient snored.  DIAGNOSIS - Obstructive Sleep Apnea (G47.33)  RECOMMENDATIONS - Suggest CPAP titration sleep study or auttopap. Other options would be based on clinical judgment. - Be careful with alcohol, sedatives and other CNS depressants that may worsen sleep apnea and disrupt normal sleep architecture. - Sleep hygiene should be reviewed to assess factors that may improve sleep quality. - Weight management and regular exercise should be initiated or continued.  [Electronically signed] 07/06/2021 11:58 AM  Baird Lyons MD, ABSM Diplomate, American Board of Sleep Medicine NPI: [id number]                         [place] [place] [facility]  ELECTRONICALLY SIGNED ON:  07/06/2021, 11:55 AM [place] PH: [phone]   FX: [phone] [place]

## 2021-07-17 ENCOUNTER — Encounter: Payer: Self-pay | Admitting: Physician Assistant

## 2021-07-17 DIAGNOSIS — G4733 Obstructive sleep apnea (adult) (pediatric): Secondary | ICD-10-CM

## 2021-07-17 NOTE — Telephone Encounter (Signed)
Ok to place order for cpap autopap for sleep apnea.

## 2021-07-17 NOTE — Telephone Encounter (Signed)
Order placed. Message sent to [facility]. They will contact the patient to get this set up.

## 2021-07-30 ENCOUNTER — Ambulatory Visit: Payer: BC Managed Care – PPO | Admitting: Physician Assistant

## 2021-08-29 ENCOUNTER — Encounter: Payer: Self-pay | Admitting: Physician Assistant

## 2021-09-01 ENCOUNTER — Other Ambulatory Visit: Payer: Self-pay | Admitting: Physician Assistant

## 2021-09-01 MED ORDER — ESCITALOPRAM OXALATE 5 MG PO TABS: 5.0000 mg | ORAL_TABLET | Freq: Every day | ORAL | 0 refills | Status: DC

## 2021-09-15 ENCOUNTER — Other Ambulatory Visit: Payer: Self-pay

## 2021-09-15 DIAGNOSIS — M5030 Other cervical disc degeneration, unspecified cervical region: Secondary | ICD-10-CM

## 2021-09-15 MED ORDER — GABAPENTIN 300 MG PO CAPS: ORAL_CAPSULE | ORAL | 3 refills | Status: DC

## 2021-09-20 ENCOUNTER — Other Ambulatory Visit: Payer: Self-pay | Admitting: Physician Assistant

## 2021-09-20 DIAGNOSIS — E6609 Other obesity due to excess calories: Secondary | ICD-10-CM

## 2021-09-20 DIAGNOSIS — F330 Major depressive disorder, recurrent, mild: Secondary | ICD-10-CM

## 2021-09-21 ENCOUNTER — Other Ambulatory Visit: Payer: Self-pay | Admitting: Physician Assistant

## 2021-09-21 DIAGNOSIS — E6609 Other obesity due to excess calories: Secondary | ICD-10-CM

## 2021-09-21 DIAGNOSIS — F330 Major depressive disorder, recurrent, mild: Secondary | ICD-10-CM

## 2021-09-26 ENCOUNTER — Other Ambulatory Visit: Payer: Self-pay | Admitting: Physician Assistant

## 2021-09-26 DIAGNOSIS — E039 Hypothyroidism, unspecified: Secondary | ICD-10-CM

## 2021-10-01 ENCOUNTER — Ambulatory Visit: Payer: Self-pay | Admitting: Physician Assistant

## 2021-10-01 ENCOUNTER — Telehealth: Payer: Self-pay | Admitting: Physician Assistant

## 2021-10-01 ENCOUNTER — Encounter: Payer: Self-pay | Admitting: Physician Assistant

## 2021-10-01 VITALS — BP 129/72 | HR 76 | Wt 203.0 lb

## 2021-10-01 DIAGNOSIS — E6609 Other obesity due to excess calories: Secondary | ICD-10-CM

## 2021-10-01 DIAGNOSIS — F330 Major depressive disorder, recurrent, mild: Secondary | ICD-10-CM

## 2021-10-01 DIAGNOSIS — Z6379 Other stressful life events affecting family and household: Secondary | ICD-10-CM | POA: Insufficient documentation

## 2021-10-01 DIAGNOSIS — R5383 Other fatigue: Secondary | ICD-10-CM

## 2021-10-01 DIAGNOSIS — Z6831 Body mass index (BMI) 31.0-31.9, adult: Secondary | ICD-10-CM

## 2021-10-01 DIAGNOSIS — G4733 Obstructive sleep apnea (adult) (pediatric): Secondary | ICD-10-CM

## 2021-10-01 MED ORDER — ESCITALOPRAM OXALATE 10 MG PO TABS: 10.0000 mg | ORAL_TABLET | Freq: Every day | ORAL | 1 refills | Status: DC

## 2021-10-01 NOTE — Progress Notes (Signed)
Established Patient Office Visit  Subjective   Patient ID: [name], female    DOB: 10-04-1960  Age: 61 y.o. MRN: [id number]  Chief Complaint  Patient presents with   Weight Check    3 month f/u    HPI [name] a 61 yo obese female with OSA, MDD and needs follow up.   She does not have insurance right now because she quit her job. She has gained 13lbs since last visit.  She was getting the semaglutide compounded at the pharmacy and she did not find it effective.  She has stopped this.  Right now she is just so tired.  She is sleeping well at night.  She wakes up not feeling rested.  She did qualify for a CPAP but has not been able to get it due to the company dragging their feet and now she does not have insurance.  She is worried she cannot afford this.  Right now she does have a lot job stress as well as some family stress.  She feels like she is still having a hard time processing her adult son transitioning into a female.  She finds this very conflicting with her believes.  She desires to support her in every way possible but she is struggling inside.  She denies any suicidal thoughts or homicidal idealizations. .. Active Ambulatory Problems    Diagnosis Date Noted   Goiter, unspecified 12/01/2006   THYROID NODULE 02/04/2009   B12 DEFICIENCY 11/02/2007   HYPERCHOLESTEROLEMIA 11/03/2005   Major depressive disorder, recurrent episode ([place]) 11/03/2005   VARICOSE VEINS 11/03/2005   EXTERNAL HEMORRHOIDS WITHOUT MENTION COMP 10/24/2007   ALLERGIC RHINITIS 04/06/2006   HERNIA, UNILATERAL INGUINAL, W/O OBST/GNGR 05/06/2006   Diverticulitis of colon (without mention of hemorrhage)(562.11) 11/03/2005   ARTHRITIS, ACROMIOCLAVICULAR 05/08/2009   Acute pain of right knee 09/26/2009   BARIATRIC SURGERY STATUS 03/16/2007   ASTHMA, WITH ACUTE EXACERBATION 02/05/2010   Thyromegaly 06/26/2010   No energy 06/26/2010   Migraines 06/26/2010   Asthma, mild intermittent 01/01/2014    Depression 01/01/2014   Thyroid activity decreased 01/01/2014   Vitamin D deficiency 01/01/2014   Class 2 obesity due to excess calories without serious comorbidity with body mass index (BMI) of 35.0 to 35.9 in adult 04/29/2015   Adenomatous polyps 09/04/2015   Primary osteoarthritis of both knees 03/25/2016   BMI 32.0-32.9,adult 05/19/2016   Dermatitis of left ear canal 04/08/2017   Nausea 04/23/2017   Drug reaction 04/23/2017   Muscle spasm 04/23/2017   Neck pain 04/23/2017   Overweight (BMI 25.0-29.9) 08/10/2017   Injury of left toe 08/07/2019   Bilateral chronic knee pain 08/07/2019   Class 1 obesity due to excess calories without serious comorbidity with body mass index (BMI) of 31.0 to 31.9 in adult 11/01/2019   Acromioclavicular joint arthritis 11/19/2010   Closed fracture of distal end of left radius 06/04/2014   Contact dermatitis and other eczema due to other chemical products 11/20/2010   Biceps tendonitis 01/02/2011   Diverticulosis of intestine 12/23/2010   History of adenomatous polyp of colon 09/03/2015   History of HPV infection 09/23/2012   Impingement syndrome of shoulder region 12/11/2010   Iron deficiency anemia 54/62/7035   Lichen planus 00/93/8182   Multiple thyroid nodules 09/01/2011   Left knee pain 12/08/2011   Pain in right knee 09/19/2019   Perineal rash in female 11/19/2010   S/P gastric bypass 06/11/2020   Type 2 superior labral anterior-to-posterior (SLAP) tear of shoulder 01/02/2011  Stress at home 08/27/2020   Palpitations 08/27/2020   Chest tightness 08/27/2020   Colon polyp 09/13/2020   Upper back pain 01/01/2021   DDD (degenerative disc disease), cervical 01/24/2021   Non-restorative sleep 04/28/2021   Snoring 05/04/2021   OSA (obstructive sleep apnea) 07/17/2021   Stressful life event affecting family 10/01/2021   Resolved Ambulatory Problems    Diagnosis Date Noted   Other chronic sinusitis 12/01/2006   SHOULDER PAIN 02/04/2006    [place] DISEASE, CERVICAL 05/08/2009   CERVICAL LYMPHADENOPATHY 10/24/2007   Right upper quadrant pain 04/17/2010   VULVAR CANDIDIASIS 10/28/2010   EUSTACHIAN TUBE DYSFUNCTION, RIGHT 08/31/2010   TINNITUS, RIGHT 08/31/2010   ANAL PRURITUS 10/28/2010   Swollen uvula 10/08/2015   Primary osteoarthritis of knee 08/07/2019   Past Medical History:  Diagnosis Date   Asthma    DDD (degenerative disc disease)    Diverticulosis    Hemorrhoids    Hemorrhoids    History of gastric bypass    Thyroid disease      ROS See HPI.    Objective:     BP 129/72   Pulse 76   Wt 203 lb (92.1 kg)   SpO2 98%   BMI 31.79 kg/m  BP Readings from Last 3 Encounters:  10/01/21 129/72  07/01/21 137/85  04/28/21 (!) 121/51   Wt Readings from Last 3 Encounters:  10/01/21 203 lb (92.1 kg)  07/01/21 203 lb (92.1 kg)  06/26/21 190 lb (86.2 kg)      Physical Exam Constitutional:      Appearance: Normal appearance. She is obese.  Cardiovascular:     Rate and Rhythm: Normal rate and regular rhythm.     Pulses: Normal pulses.     Heart sounds: Normal heart sounds.  Pulmonary:     Effort: Pulmonary effort is normal.     Breath sounds: Normal breath sounds.  Musculoskeletal:     Right lower leg: No edema.     Left lower leg: No edema.  Neurological:     General: No focal deficit present.     Mental Status: She is alert and oriented to person, place, and time.  Psychiatric:        Mood and Affect: Mood normal.         Assessment & Plan:  Marland KitchenMarland KitchenCrystalynn was seen today for weight check.  Diagnoses and all orders for this visit:  OSA (obstructive sleep apnea)  Class 1 obesity due to excess calories without serious comorbidity with body mass index (BMI) of 31.0 to 31.9 in adult  Mild episode of recurrent major depressive disorder (HCC) -     escitalopram (LEXAPRO) 10 MG tablet; Take 1 tablet (10 mg total) by mouth daily.  No energy  Stressful life event affecting family   Attempt to see  how much cash pay of CPAP machine would be Reassured labs in April looked really good Increased lexapro to '10mg'$  daily Strongly encouraged counseling and gave some cheaper reasources   Iran Planas, PA-C

## 2021-10-27 NOTE — Telephone Encounter (Signed)
Error

## 2021-11-28 ENCOUNTER — Other Ambulatory Visit: Payer: Self-pay | Admitting: Physician Assistant

## 2021-11-28 DIAGNOSIS — F330 Major depressive disorder, recurrent, mild: Secondary | ICD-10-CM

## 2021-12-05 ENCOUNTER — Encounter: Payer: Self-pay | Admitting: Physician Assistant

## 2021-12-05 DIAGNOSIS — R0681 Apnea, not elsewhere classified: Secondary | ICD-10-CM

## 2021-12-05 DIAGNOSIS — R0902 Hypoxemia: Secondary | ICD-10-CM

## 2021-12-09 NOTE — Progress Notes (Signed)
Error

## 2021-12-22 ENCOUNTER — Encounter: Payer: Self-pay | Admitting: Physician Assistant

## 2022-02-18 DIAGNOSIS — M1712 Unilateral primary osteoarthritis, left knee: Secondary | ICD-10-CM | POA: Diagnosis not present

## 2022-03-09 ENCOUNTER — Telehealth: Payer: Self-pay | Admitting: Neurology

## 2022-03-09 NOTE — Telephone Encounter (Signed)
Received note from [facility] requesting preoperative clearance. Patient needs an appt. Please call to schedule.

## 2022-03-25 ENCOUNTER — Ambulatory Visit (INDEPENDENT_AMBULATORY_CARE_PROVIDER_SITE_OTHER): Payer: 59 | Admitting: Physician Assistant

## 2022-03-25 VITALS — BP 135/59 | HR 86 | Ht 67.0 in | Wt 201.0 lb

## 2022-03-25 DIAGNOSIS — E559 Vitamin D deficiency, unspecified: Secondary | ICD-10-CM

## 2022-03-25 DIAGNOSIS — E039 Hypothyroidism, unspecified: Secondary | ICD-10-CM | POA: Diagnosis not present

## 2022-03-25 DIAGNOSIS — E538 Deficiency of other specified B group vitamins: Secondary | ICD-10-CM | POA: Diagnosis not present

## 2022-03-25 DIAGNOSIS — Z131 Encounter for screening for diabetes mellitus: Secondary | ICD-10-CM | POA: Diagnosis not present

## 2022-03-25 DIAGNOSIS — Z01818 Encounter for other preprocedural examination: Secondary | ICD-10-CM

## 2022-03-25 DIAGNOSIS — Z13220 Encounter for screening for lipoid disorders: Secondary | ICD-10-CM

## 2022-03-25 MED ORDER — LEVOTHYROXINE SODIUM 88 MCG PO TABS: 88.0000 ug | ORAL_TABLET | Freq: Every day | ORAL | 0 refills | Status: DC

## 2022-03-25 NOTE — Progress Notes (Signed)
Established Patient Office Visit  Subjective   Patient ID: [name], female    DOB: [birth year]  Age: 62 y.o. MRN: [id number]  Chief Complaint  Patient presents with   Pre-op Exam    HPI [name] a 62 yo female who presents to the clinic for pre-surgical clearance for left total knee replacement on 05/20/2022.   She is doing well. She has no concerns.   .. Active Ambulatory Problems    Diagnosis Date Noted   Goiter, unspecified 12/01/2006   THYROID NODULE 02/04/2009   B12 deficiency 11/02/2007   HYPERCHOLESTEROLEMIA 11/03/2005   Major depressive disorder, recurrent episode ([place]) 11/03/2005   VARICOSE VEINS 11/03/2005   EXTERNAL HEMORRHOIDS WITHOUT MENTION COMP 10/24/2007   ALLERGIC RHINITIS 04/06/2006   HERNIA, UNILATERAL INGUINAL, W/O OBST/GNGR 05/06/2006   Diverticulitis of colon (without mention of hemorrhage)(562.11) 11/03/2005   ARTHRITIS, ACROMIOCLAVICULAR 05/08/2009   Acute pain of right knee 09/26/2009   BARIATRIC SURGERY STATUS 03/16/2007   ASTHMA, WITH ACUTE EXACERBATION 02/05/2010   Thyromegaly 06/26/2010   No energy 06/26/2010   Migraines 06/26/2010   Asthma, mild intermittent 01/01/2014   Depression 01/01/2014   Thyroid activity decreased 01/01/2014   Vitamin D deficiency 01/01/2014   Class 2 obesity due to excess calories without serious comorbidity with body mass index (BMI) of 35.0 to 35.9 in adult 04/29/2015   Adenomatous polyps 09/04/2015   Primary osteoarthritis of both knees 03/25/2016   BMI 32.0-32.9,adult 05/19/2016   Dermatitis of left ear canal 04/08/2017   Nausea 04/23/2017   Drug reaction 04/23/2017   Muscle spasm 04/23/2017   Neck pain 04/23/2017   Overweight (BMI 25.0-29.9) 08/10/2017   Injury of left toe 08/07/2019   Bilateral chronic knee pain 08/07/2019   Class 1 obesity due to excess calories without serious comorbidity with body mass index (BMI) of 31.0 to 31.9 in adult 11/01/2019   Acromioclavicular joint arthritis 11/19/2010    Closed fracture of distal end of left radius 06/04/2014   Contact dermatitis and other eczema due to other chemical products 11/20/2010   Biceps tendonitis 01/02/2011   Diverticulosis of intestine 12/23/2010   History of adenomatous polyp of colon 09/03/2015   History of HPV infection 09/23/2012   Impingement syndrome of shoulder region 12/11/2010   Iron deficiency anemia [id number]   Lichen planus [id number]   Multiple thyroid nodules 09/01/2011   Left knee pain 12/08/2011   Pain in right knee 09/19/2019   Perineal rash in female 11/19/2010   S/P gastric bypass 06/11/2020   Type 2 superior labral anterior-to-posterior (SLAP) tear of shoulder 01/02/2011   Stress at home 08/27/2020   Palpitations 08/27/2020   Chest tightness 08/27/2020   Colon polyp 09/13/2020   Upper back pain 01/01/2021   DDD (degenerative disc disease), cervical 01/24/2021   Non-restorative sleep 04/28/2021   Snoring 05/04/2021   OSA (obstructive sleep apnea) 07/17/2021   Stressful life event affecting family 10/01/2021   Resolved Ambulatory Problems    Diagnosis Date Noted   Other chronic sinusitis 12/01/2006   SHOULDER PAIN 02/04/2006   [place] DISEASE, CERVICAL 05/08/2009   CERVICAL LYMPHADENOPATHY 10/24/2007   Right upper quadrant pain 04/17/2010   VULVAR CANDIDIASIS 10/28/2010   EUSTACHIAN TUBE DYSFUNCTION, RIGHT 08/31/2010   TINNITUS, RIGHT 08/31/2010   ANAL PRURITUS 10/28/2010   Swollen uvula 10/08/2015   Primary osteoarthritis of knee 08/07/2019   Past Medical History:  Diagnosis Date   Asthma    DDD (degenerative disc disease)    Diverticulosis  Hemorrhoids    Hemorrhoids    History of gastric bypass    Thyroid disease     Review of Systems  Constitutional: Negative.  Negative for fever.  Eyes: Negative.   Respiratory: Negative.    Cardiovascular: Negative.   Gastrointestinal: Negative.   Genitourinary: Negative.   Musculoskeletal:  Positive for joint pain.  Skin: Negative.    Neurological: Negative.   Psychiatric/Behavioral: Negative.        Objective:     BP (!) 135/59 (BP Location: Left Arm, Patient Position: Sitting, Cuff Size: Normal)   Pulse 86   Ht '5\' 7"'$  (1.702 m)   Wt 201 lb (91.2 kg)   SpO2 99%   BMI 31.48 kg/m  BP Readings from Last 3 Encounters:  03/25/22 (!) 135/59  10/01/21 129/72  07/01/21 137/85   Wt Readings from Last 3 Encounters:  03/25/22 201 lb (91.2 kg)  10/01/21 203 lb (92.1 kg)  07/01/21 203 lb (92.1 kg)    ..    03/25/2022    7:41 AM 10/01/2021    7:29 AM 07/01/2021   11:09 AM 04/28/2021    4:27 PM 08/27/2020   11:17 AM  Depression screen PHQ 2/9  Decreased Interest 0 '1 1 1 '$ 0  Down, Depressed, Hopeless 0 '1 1 1 1  '$ PHQ - 2 Score 0 '2 2 2 1  '$ Altered sleeping '1 1  2 1  '$ Tired, decreased energy '1 3  3 3  '$ Change in appetite 0 '1  3 2  '$ Feeling bad or failure about yourself  0 0  0 0  Trouble concentrating 0 '1  2 1  '$ Moving slowly or fidgety/restless 0 0  0 0  Suicidal thoughts 0 0  0 0  PHQ-9 Score '2 8  12 8  '$ Difficult doing work/chores Not difficult at all Somewhat difficult  Somewhat difficult Somewhat difficult   ..    03/25/2022    7:41 AM 10/01/2021    7:29 AM 04/28/2021    4:27 PM 08/27/2020   11:18 AM  GAD 7 : Generalized Anxiety Score  Nervous, Anxious, on Edge 0 0 0 0  Control/stop worrying 0 1 0 0  Worry too much - different things 0 1 0 1  Trouble relaxing 0 0 0 0  Restless 0 0 0 0  Easily annoyed or irritable 0 '1 1 1  '$ Afraid - awful might happen 0 0 0 0  Total GAD 7 Score 0 '3 1 2  '$ Anxiety Difficulty Not difficult at all Somewhat difficult Not difficult at all Somewhat difficult      Physical Exam Constitutional:      Appearance: Normal appearance. She is obese.  HENT:     Head: Normocephalic.  Cardiovascular:     Rate and Rhythm: Normal rate and regular rhythm.  Pulmonary:     Effort: Pulmonary effort is normal.     Breath sounds: Normal breath sounds.  Musculoskeletal:     Right lower leg: No edema.      Left lower leg: No edema.  Neurological:     General: No focal deficit present.     Mental Status: She is alert and oriented to person, place, and time.  Psychiatric:        Mood and Affect: Mood normal.     EKG-NSR with no ST elevation or depression.    The 10-year ASCVD risk score (Arnett DK, et al., 2019) is: 3.3%    Assessment & Plan:  Marland KitchenMarland KitchenMinta was seen today  for pre-op exam.  Diagnoses and all orders for this visit:  Preoperative clearance -     EKG 12-Lead  Acquired hypothyroidism -     TSH  Screening for lipid disorders -     Lipid panel  Screening for diabetes mellitus -     Comprehensive metabolic panel  Vitamin D deficiency -     VITAMIN D 25 Hydroxy (Vit-D Deficiency, Fractures)  B12 deficiency -     CBC w/Diff/Platelet -     B12 and Folate Panel   Need for screening labs after 4/3 EKG reviewed with no concerns Labs will be drawn by orthopedic office Forms filled out and cleared for surgery [name] low risk  Iran Planas, PA-C

## 2022-04-03 DIAGNOSIS — Z13220 Encounter for screening for lipoid disorders: Secondary | ICD-10-CM | POA: Diagnosis not present

## 2022-04-03 DIAGNOSIS — Z131 Encounter for screening for diabetes mellitus: Secondary | ICD-10-CM | POA: Diagnosis not present

## 2022-04-03 DIAGNOSIS — E559 Vitamin D deficiency, unspecified: Secondary | ICD-10-CM | POA: Diagnosis not present

## 2022-04-03 DIAGNOSIS — E538 Deficiency of other specified B group vitamins: Secondary | ICD-10-CM | POA: Diagnosis not present

## 2022-04-03 DIAGNOSIS — E039 Hypothyroidism, unspecified: Secondary | ICD-10-CM | POA: Diagnosis not present

## 2022-04-04 LAB — CBC WITH DIFFERENTIAL/PLATELET
Basophils Absolute: 0 10*3/uL (ref 0.0–0.2)
Basos: 1 %
EOS (ABSOLUTE): 0.1 10*3/uL (ref 0.0–0.4)
Eos: 2 %
Hematocrit: 39.1 % (ref 34.0–46.6)
Hemoglobin: 13.1 g/dL (ref 11.1–15.9)
Immature Grans (Abs): 0 10*3/uL (ref 0.0–0.1)
Immature Granulocytes: 0 %
Lymphocytes Absolute: 1.4 10*3/uL (ref 0.7–3.1)
Lymphs: 27 %
MCH: 30 pg (ref 26.6–33.0)
MCHC: 33.5 g/dL (ref 31.5–35.7)
MCV: 90 fL (ref 79–97)
Monocytes Absolute: 0.3 10*3/uL (ref 0.1–0.9)
Monocytes: 7 %
Neutrophils Absolute: 3.2 10*3/uL (ref 1.4–7.0)
Neutrophils: 63 %
Platelets: 280 10*3/uL (ref 150–450)
RBC: 4.37 x10E6/uL (ref 3.77–5.28)
RDW: 12.8 % (ref 11.7–15.4)
WBC: 5 10*3/uL (ref 3.4–10.8)

## 2022-04-04 LAB — COMPREHENSIVE METABOLIC PANEL
ALT: 9 IU/L (ref 0–32)
AST: 20 IU/L (ref 0–40)
Albumin/Globulin Ratio: 1.9 (ref 1.2–2.2)
Albumin: 4.4 g/dL (ref 3.8–4.9)
Alkaline Phosphatase: 107 IU/L (ref 44–121)
BUN/Creatinine Ratio: 10 — ABNORMAL LOW (ref 12–28)
BUN: 7 mg/dL — ABNORMAL LOW (ref 8–27)
Bilirubin Total: 0.4 mg/dL (ref 0.0–1.2)
CO2: 25 mmol/L (ref 20–29)
Calcium: 9.4 mg/dL (ref 8.7–10.3)
Chloride: 100 mmol/L (ref 96–106)
Creatinine, Ser: 0.71 mg/dL (ref 0.57–1.00)
Globulin, Total: 2.3 g/dL (ref 1.5–4.5)
Glucose: 86 mg/dL (ref 70–99)
Potassium: 4.9 mmol/L (ref 3.5–5.2)
Sodium: 138 mmol/L (ref 134–144)
Total Protein: 6.7 g/dL (ref 6.0–8.5)
eGFR: 97 mL/min/{1.73_m2} (ref >59)

## 2022-04-04 LAB — B12 AND FOLATE PANEL
Folate: 4.4 ng/mL (ref >3.0)
Vitamin B-12: >2000 pg/mL — ABNORMAL HIGH (ref 232–1245)

## 2022-04-04 LAB — LIPID PANEL
Chol/HDL Ratio: 2.9 ratio (ref 0.0–4.4)
Cholesterol, Total: 246 mg/dL — ABNORMAL HIGH (ref 100–199)
HDL: 86 mg/dL (ref >39)
LDL Chol Calc (NIH): 143 mg/dL — ABNORMAL HIGH (ref 0–99)
Triglycerides: 102 mg/dL (ref 0–149)
VLDL Cholesterol Cal: 17 mg/dL (ref 5–40)

## 2022-04-04 LAB — TSH: TSH: 2.71 u[IU]/mL (ref 0.450–4.500)

## 2022-04-04 LAB — VITAMIN D 25 HYDROXY (VIT D DEFICIENCY, FRACTURES): Vit D, 25-Hydroxy: 39.2 ng/mL (ref 30.0–100.0)

## 2022-04-06 ENCOUNTER — Encounter: Payer: Self-pay | Admitting: Physician Assistant

## 2022-04-06 NOTE — Progress Notes (Signed)
Sandi,   Vitamin D looks good.  Hemoglobin looks good.  Thyroid looks good.  B12 is too elevated. How much are you taking?   LDL not to goal but overall risk is low over next 10 years. You are taking lipitor daily, correct?   Marland KitchenMarland KitchenThe 10-year ASCVD risk score (Arnett DK, et al., 2019) is: 3.2%   Values used to calculate the score:     Age: 62 years     Sex: Female     Is Non-Hispanic African American: No     Diabetic: No     Tobacco smoker: No     Systolic Blood Pressure: [id number] mmHg     Is BP treated: No     HDL Cholesterol: 86 mg/dL     Total Cholesterol: 246 mg/dL   Ok for surgery. Did we send form?

## 2022-04-16 ENCOUNTER — Telehealth: Payer: Self-pay | Admitting: Neurology

## 2022-04-16 DIAGNOSIS — Z0184 Encounter for antibody response examination: Secondary | ICD-10-CM

## 2022-04-16 NOTE — Telephone Encounter (Signed)
Patient called needing TB lab test for new job. Lab ordered.

## 2022-04-17 ENCOUNTER — Ambulatory Visit: Payer: Self-pay | Admitting: Physician Assistant

## 2022-04-29 ENCOUNTER — Other Ambulatory Visit: Payer: Self-pay | Admitting: Physician Assistant

## 2022-04-29 MED ORDER — VALACYCLOVIR HCL 1 G PO TABS: 1000.0000 mg | ORAL_TABLET | Freq: Two times a day (BID) | ORAL | 0 refills | Status: AC

## 2022-06-29 ENCOUNTER — Other Ambulatory Visit: Payer: Self-pay | Admitting: Physician Assistant

## 2022-06-29 DIAGNOSIS — E039 Hypothyroidism, unspecified: Secondary | ICD-10-CM

## 2022-07-06 ENCOUNTER — Ambulatory Visit: Admit: 2022-07-06 | Payer: 59 | Admitting: Orthopedic Surgery

## 2022-07-06 SURGERY — ARTHROPLASTY, KNEE, TOTAL
Anesthesia: Choice | Site: Knee | Laterality: Left

## 2022-09-04 ENCOUNTER — Telehealth: Payer: Self-pay | Admitting: Physician Assistant

## 2022-09-04 DIAGNOSIS — Z111 Encounter for screening for respiratory tuberculosis: Secondary | ICD-10-CM

## 2022-09-04 NOTE — Telephone Encounter (Signed)
Patient called in wanting to do [employer] lab for work. Would like this done next Tuesday, if possible.

## 2022-09-08 ENCOUNTER — Ambulatory Visit: Payer: 59

## 2022-09-08 NOTE — Addendum Note (Signed)
Addended by: Chalmers Cater on: 09/08/2022 09:11 AM   Modules accepted: Orders

## 2022-09-11 NOTE — Progress Notes (Signed)
Negative TB screen.

## 2022-09-26 ENCOUNTER — Other Ambulatory Visit: Payer: Self-pay | Admitting: Physician Assistant

## 2022-09-26 DIAGNOSIS — E039 Hypothyroidism, unspecified: Secondary | ICD-10-CM

## 2022-10-02 ENCOUNTER — Other Ambulatory Visit (HOSPITAL_COMMUNITY): Payer: Self-pay

## 2022-10-02 ENCOUNTER — Other Ambulatory Visit: Payer: Self-pay | Admitting: Physician Assistant

## 2022-10-02 DIAGNOSIS — E039 Hypothyroidism, unspecified: Secondary | ICD-10-CM

## 2022-10-02 MED ORDER — LEVOTHYROXINE SODIUM 88 MCG PO TABS
88.0000 ug | ORAL_TABLET | Freq: Every day | ORAL | 1 refills | Status: DC
  Filled 2022-10-02: qty 90, 90d supply, fill #0

## 2022-10-02 NOTE — Telephone Encounter (Signed)
Sent to [facility]. They can mail it to her as well as she pick up.

## 2023-03-07 ENCOUNTER — Other Ambulatory Visit: Payer: Self-pay | Admitting: Physician Assistant

## 2023-03-08 MED ORDER — ALBUTEROL SULFATE HFA 108 (90 BASE) MCG/ACT IN AERS: 2.0000 | INHALATION_SPRAY | Freq: Four times a day (QID) | RESPIRATORY_TRACT | 0 refills | Status: DC | PRN

## 2023-04-04 ENCOUNTER — Other Ambulatory Visit: Payer: Self-pay | Admitting: Physician Assistant

## 2023-05-12 ENCOUNTER — Other Ambulatory Visit: Payer: Self-pay | Admitting: Physician Assistant

## 2023-05-12 DIAGNOSIS — E039 Hypothyroidism, unspecified: Secondary | ICD-10-CM

## 2023-06-06 IMAGING — DX DG CERVICAL SPINE COMPLETE 4+V
5 series · 5 of 5 positions shown · non-contrast
Comparison: Remote cervical spine radiograph 05/22/2010

CLINICAL DATA: Follow-up cervical DDD. Degeneration of cervical
intervertebral disc. Upper back pain. Neck pain.

EXAM:
CERVICAL SPINE - COMPLETE 4+ VIEW

[c-spine lat]
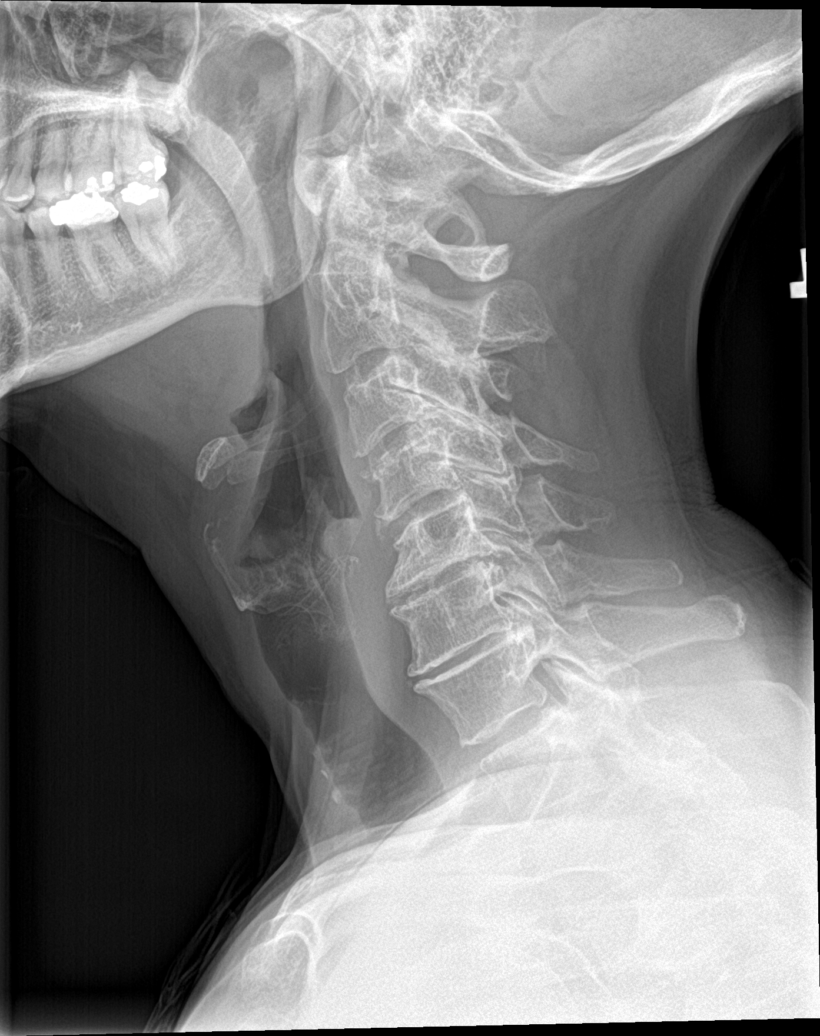

[c-spine obl (1 of 2)]
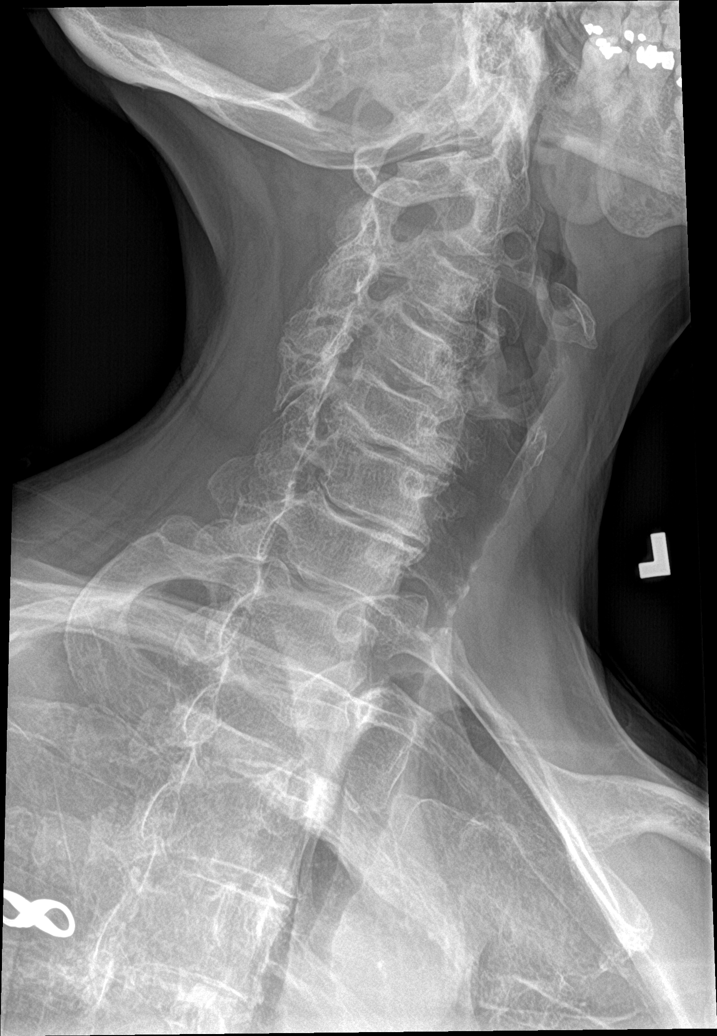

[c-spine ap]
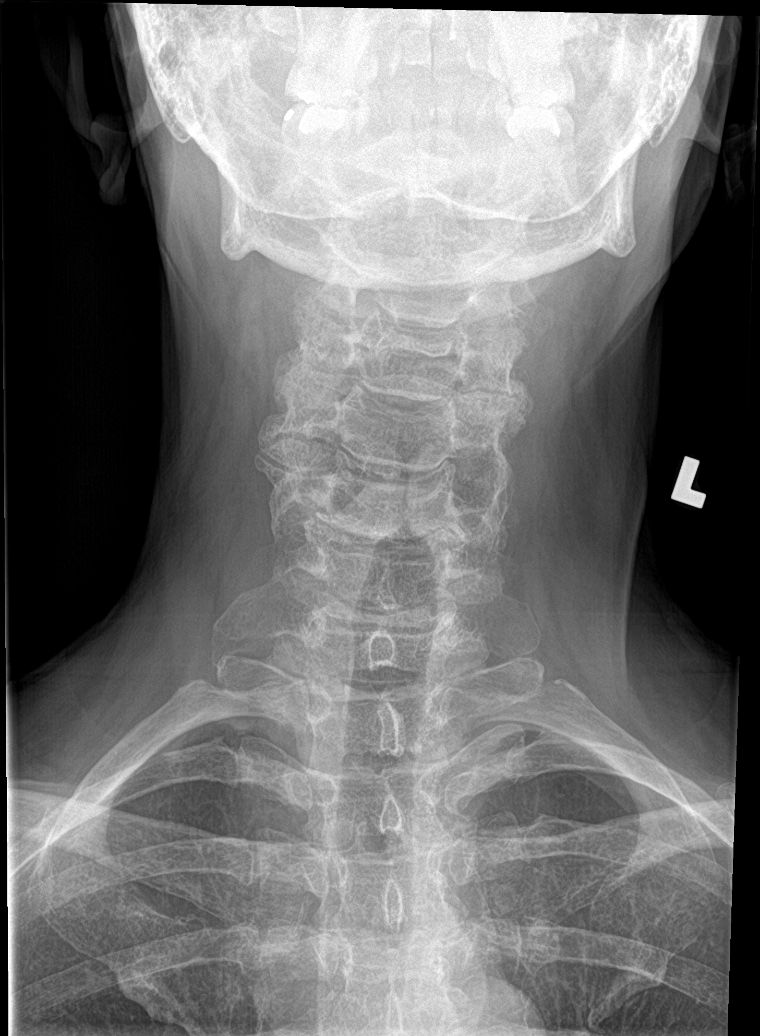

[c-spine open mouth]
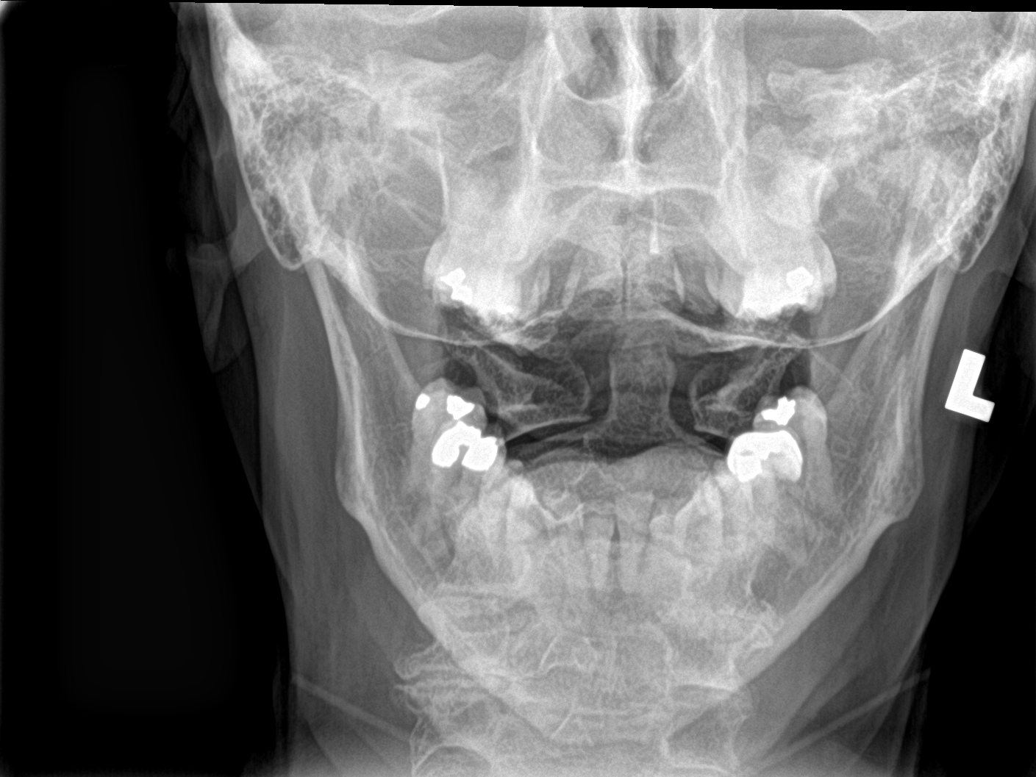

[c-spine obl (2 of 2)]
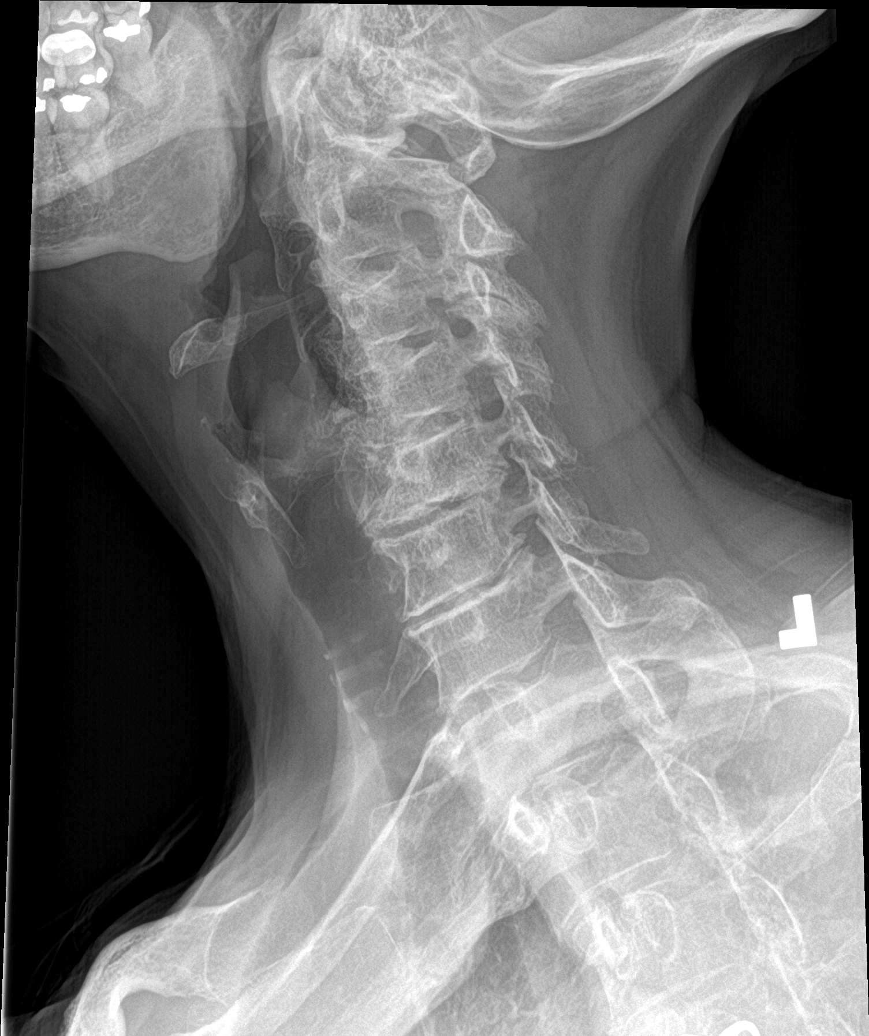

[5 of 5 positions shown; findings below may reference images not displayed]

FINDINGS: Straightening of normal lordosis. Grade 1 anterolisthesis of C3 on
C4, grade 1 retrolisthesis of C5 on C6, and grade 1 anterolisthesis
of C7 on T1, likely degenerative. Progressive disc space narrowing
and endplate spurring at C3-C4, C4-C5, C5-C6, and C6-C7 from remote
prior exam. There is multilevel facet hypertrophy. There is bony
neural foraminal narrowing on the left at C3-C4 and C6-C7, bony
neural foraminal narrowing on the right at C4-C5 and C5-C6. No
evidence of fracture, focal bone lesion or bone destruction. Tiny
right cervical rib. Lung apices are clear. There is no prevertebral
soft tissue thickening.
IMPRESSION: 1. Progressive moderate degenerative disc disease throughout the
cervical spine from remote [id number] exam.
2. Multilevel facet hypertrophy with bony neural foraminal narrowing
bilaterally.
3. Grade 1 anterolisthesis of C3 on C4 and C7 on T1, and grade 1
retrolisthesis of C5 on C6.

## 2023-08-18 ENCOUNTER — Other Ambulatory Visit: Payer: Self-pay | Admitting: Physician Assistant

## 2023-08-18 DIAGNOSIS — E039 Hypothyroidism, unspecified: Secondary | ICD-10-CM

## 2023-08-20 NOTE — Telephone Encounter (Signed)
 Please call patient and let her know that we need to get her TSH checked urgently as then in the next couple of weeks if at all possible.  It has been over a year since she has had a TSH so only send in 30-day supply on her medication until we know for sure if it is adequate or if we might need to make an adjustment.

## 2023-08-27 NOTE — Procedures (Signed)
 SABRA

## 2023-09-28 ENCOUNTER — Encounter: Payer: Self-pay | Admitting: Sports Medicine

## 2023-10-08 ENCOUNTER — Encounter: Payer: Self-pay | Admitting: Physician Assistant

## 2023-10-08 DIAGNOSIS — E039 Hypothyroidism, unspecified: Secondary | ICD-10-CM

## 2023-10-08 MED ORDER — LEVOTHYROXINE SODIUM 88 MCG PO TABS: 88.0000 ug | ORAL_TABLET | Freq: Every day | ORAL | 0 refills | Status: DC

## 2023-10-20 ENCOUNTER — Encounter: Payer: Self-pay | Admitting: Physician Assistant

## 2023-10-20 DIAGNOSIS — D508 Other iron deficiency anemias: Secondary | ICD-10-CM

## 2023-10-20 DIAGNOSIS — Z0000 Encounter for general adult medical examination without abnormal findings: Secondary | ICD-10-CM

## 2023-10-20 DIAGNOSIS — E538 Deficiency of other specified B group vitamins: Secondary | ICD-10-CM

## 2023-10-20 DIAGNOSIS — E042 Nontoxic multinodular goiter: Secondary | ICD-10-CM

## 2023-10-20 DIAGNOSIS — E7800 Pure hypercholesterolemia, unspecified: Secondary | ICD-10-CM

## 2023-10-20 DIAGNOSIS — Z131 Encounter for screening for diabetes mellitus: Secondary | ICD-10-CM

## 2023-10-20 DIAGNOSIS — E559 Vitamin D deficiency, unspecified: Secondary | ICD-10-CM

## 2023-10-20 NOTE — Telephone Encounter (Signed)
 Signed.

## 2023-10-21 NOTE — Telephone Encounter (Signed)
 Patient also requesting an iron panel be added to her lab work order ?

## 2023-10-22 NOTE — Addendum Note (Signed)
 Addended byBETHA DUWAINE RIGGS on: 10/22/2023 10:42 AM   Modules accepted: Orders

## 2023-10-22 NOTE — Telephone Encounter (Signed)
 Iron added today

## 2023-10-28 DIAGNOSIS — E7800 Pure hypercholesterolemia, unspecified: Secondary | ICD-10-CM | POA: Diagnosis not present

## 2023-10-28 DIAGNOSIS — E042 Nontoxic multinodular goiter: Secondary | ICD-10-CM | POA: Diagnosis not present

## 2023-10-28 DIAGNOSIS — E538 Deficiency of other specified B group vitamins: Secondary | ICD-10-CM | POA: Diagnosis not present

## 2023-10-28 DIAGNOSIS — Z131 Encounter for screening for diabetes mellitus: Secondary | ICD-10-CM | POA: Diagnosis not present

## 2023-10-28 DIAGNOSIS — E559 Vitamin D deficiency, unspecified: Secondary | ICD-10-CM | POA: Diagnosis not present

## 2023-10-28 DIAGNOSIS — D508 Other iron deficiency anemias: Secondary | ICD-10-CM | POA: Diagnosis not present

## 2023-10-29 LAB — CMP14+EGFR
ALT: 8 IU/L (ref 0–32)
AST: 20 IU/L (ref 0–40)
Albumin: 4.4 g/dL (ref 3.9–4.9)
Alkaline Phosphatase: 112 IU/L (ref 49–135)
BUN/Creatinine Ratio: 13 (ref 12–28)
BUN: 8 mg/dL (ref 8–27)
Bilirubin Total: 0.5 mg/dL (ref 0.0–1.2)
CO2: 23 mmol/L (ref 20–29)
Calcium: 9.3 mg/dL (ref 8.7–10.3)
Chloride: 104 mmol/L (ref 96–106)
Creatinine, Ser: 0.61 mg/dL (ref 0.57–1.00)
Globulin, Total: 2.2 g/dL (ref 1.5–4.5)
Glucose: 83 mg/dL (ref 70–99)
Potassium: 4.6 mmol/L (ref 3.5–5.2)
Sodium: 141 mmol/L (ref 134–144)
Total Protein: 6.6 g/dL (ref 6.0–8.5)
eGFR: 101 mL/min/1.73 (ref >59)

## 2023-10-29 LAB — CBC
Hematocrit: 40.7 % (ref 34.0–46.6)
Hemoglobin: 12.9 g/dL (ref 11.1–15.9)
MCH: 29.9 pg (ref 26.6–33.0)
MCHC: 31.7 g/dL (ref 31.5–35.7)
MCV: 94 fL (ref 79–97)
Platelets: 250 x10E3/uL (ref 150–450)
RBC: 4.31 x10E6/uL (ref 3.77–5.28)
RDW: 12.4 % (ref 11.7–15.4)
WBC: 4.8 x10E3/uL (ref 3.4–10.8)

## 2023-10-29 LAB — TSH+FREE T4
Free T4: 1.3 ng/dL (ref 0.82–1.77)
TSH: 1.44 u[IU]/mL (ref 0.450–4.500)

## 2023-10-29 LAB — LIPID PANEL
Chol/HDL Ratio: 2.8 ratio (ref 0.0–4.4)
Cholesterol, Total: 213 mg/dL — ABNORMAL HIGH (ref 100–199)
HDL: 76 mg/dL (ref >39)
LDL Chol Calc (NIH): 126 mg/dL — ABNORMAL HIGH (ref 0–99)
Triglycerides: 63 mg/dL (ref 0–149)
VLDL Cholesterol Cal: 11 mg/dL (ref 5–40)

## 2023-10-29 LAB — B12 AND FOLATE PANEL
Folate: 18.5 ng/mL (ref >3.0)
Vitamin B-12: 766 pg/mL (ref 232–1245)

## 2023-10-29 LAB — FERRITIN: Ferritin: 69 ng/mL (ref 15–150)

## 2023-10-29 LAB — VITAMIN D 25 HYDROXY (VIT D DEFICIENCY, FRACTURES): Vit D, 25-Hydroxy: 35.4 ng/mL (ref 30.0–100.0)

## 2023-11-01 ENCOUNTER — Encounter: Payer: Self-pay | Admitting: Physician Assistant

## 2023-11-01 ENCOUNTER — Ambulatory Visit (INDEPENDENT_AMBULATORY_CARE_PROVIDER_SITE_OTHER): Payer: Self-pay | Admitting: Physician Assistant

## 2023-11-01 VITALS — BP 131/91 | HR 74 | Ht 67.0 in | Wt 233.0 lb

## 2023-11-01 DIAGNOSIS — G4733 Obstructive sleep apnea (adult) (pediatric): Secondary | ICD-10-CM

## 2023-11-01 DIAGNOSIS — G4710 Hypersomnia, unspecified: Secondary | ICD-10-CM | POA: Diagnosis not present

## 2023-11-01 DIAGNOSIS — Z6836 Body mass index (BMI) 36.0-36.9, adult: Secondary | ICD-10-CM | POA: Diagnosis not present

## 2023-11-01 DIAGNOSIS — E039 Hypothyroidism, unspecified: Secondary | ICD-10-CM | POA: Diagnosis not present

## 2023-11-01 DIAGNOSIS — E042 Nontoxic multinodular goiter: Secondary | ICD-10-CM | POA: Diagnosis not present

## 2023-11-01 DIAGNOSIS — Z1231 Encounter for screening mammogram for malignant neoplasm of breast: Secondary | ICD-10-CM | POA: Diagnosis not present

## 2023-11-01 DIAGNOSIS — E66812 Obesity, class 2: Secondary | ICD-10-CM | POA: Diagnosis not present

## 2023-11-01 DIAGNOSIS — Z0000 Encounter for general adult medical examination without abnormal findings: Secondary | ICD-10-CM

## 2023-11-01 DIAGNOSIS — E559 Vitamin D deficiency, unspecified: Secondary | ICD-10-CM | POA: Diagnosis not present

## 2023-11-01 DIAGNOSIS — E538 Deficiency of other specified B group vitamins: Secondary | ICD-10-CM

## 2023-11-01 NOTE — Progress Notes (Unsigned)
 Complete physical exam  Patient: [name]   DOB: 11-22-1960   63 y.o. Female  MRN: [id number]  Subjective:    Chief Complaint  Patient presents with   Annual Exam   Discussed the use of AI scribe software for clinical note transcription with the patient, who gave verbal consent to proceed.  History of Present Illness [name] is a 63 year old female who presents with fatigue and sleep disturbances. She is accompanied by her daughter, Powell.  Fatigue - Significant fatigue persists despite normal laboratory results - Requires daytime rest due to overwhelming tiredness - Denies depression - Acknowledges stress related to current life circumstances  Sleep disturbance and obstructive sleep apnea - Sleep disturbances persist despite two years of CPAP therapy - No noticeable improvement in symptoms with CPAP use - CPAP issues include tangling at night and discomfort as a side sleeper - Occasionally removes CPAP during the night despite achieving a good seal  Blood pressure abnormalities - Recent blood pressure readings include 106/45 mmHg (perceived as low) and 136/unknown (perceived as acceptable) - Not currently taking antihypertensive medications  Dietary habits and nutritional status - Diet is mostly gluten-free and dairy-free, primarily due to daughter's dietary needs - Difficulty maintaining a consistent diet due to a busy schedule and frequent eating on the run - Desires improvement in diet and exercise routine but finds it difficult to find time for exercise - Recent laboratory results show normal B12, folate, iron stores, hemoglobin, white blood count, platelets, HDL, and thyroid  function - Vitamin D  level is slightly low - Takes fish oil supplements, believed to contribute to improved cholesterol levels  Abdominal discomfort - Experiences usual abdominal discomfort related to prior surgery - No new or concerning abdominal symptoms    Most  recent fall risk assessment:    11/01/2023    4:59 PM  Fall Risk   Falls in the past year? 0  Number falls in past yr: 0  Injury with Fall? 0     Most recent depression screenings:    11/01/2023    4:59 PM 03/25/2022    7:41 AM  PHQ 2/9 Scores  PHQ - 2 Score 0 0  PHQ- 9 Score 1 2    Vision:Within last year and Dental: No current dental problems and Receives regular dental care  Patient Active Problem List   Diagnosis Date Noted   Stressful life event affecting family 10/01/2021   OSA on CPAP 07/17/2021   Snoring 05/04/2021   Non-restorative sleep 04/28/2021   DDD (degenerative disc disease), cervical 01/24/2021   Upper back pain 01/01/2021   Colon polyp 09/13/2020   Stress at home 08/27/2020   Palpitations 08/27/2020   Chest tightness 08/27/2020   S/P gastric bypass 06/11/2020   Class 1 obesity due to excess calories without serious comorbidity with body mass index (BMI) of 31.0 to 31.9 in adult 11/01/2019   Pain in right knee 09/19/2019   Injury of left toe 08/07/2019   Bilateral chronic knee pain 08/07/2019   Overweight (BMI 25.0-29.9) 08/10/2017   Nausea 04/23/2017   Drug reaction 04/23/2017   Muscle spasm 04/23/2017   Neck pain 04/23/2017   Dermatitis of left ear canal 04/08/2017   BMI 32.0-32.9,adult 05/19/2016   Primary osteoarthritis of both knees 03/25/2016   Adenomatous polyps 09/04/2015   History of adenomatous polyp of colon 09/03/2015   Class 2 obesity due to excess calories without serious comorbidity with body mass index (BMI) of 35.0 to 35.9  in adult 04/29/2015   Closed fracture of distal end of left radius 06/04/2014   Asthma, mild intermittent 01/01/2014   Depression 01/01/2014   Thyroid  activity decreased 01/01/2014   Vitamin D  deficiency 01/01/2014   Lichen planus 04/10/2013   History of HPV infection 09/23/2012   Left knee pain 12/08/2011   Multiple thyroid  nodules 09/01/2011   Biceps tendonitis 01/02/2011   Type 2 superior labral  anterior-to-posterior (SLAP) tear of shoulder 01/02/2011   Diverticulosis of intestine 12/23/2010   Iron deficiency anemia 12/23/2010   Impingement syndrome of shoulder region 12/11/2010   Contact dermatitis and other eczema due to other chemical products 11/20/2010   Acromioclavicular joint arthritis 11/19/2010   Perineal rash in female 11/19/2010   Thyromegaly 06/26/2010   No energy 06/26/2010   Migraines 06/26/2010   Asthma with exacerbation 02/05/2010   Acute pain of right knee 09/26/2009   ARTHRITIS, ACROMIOCLAVICULAR 05/08/2009   THYROID  NODULE 02/04/2009   B12 deficiency 11/02/2007   External hemorrhoids 10/24/2007   BARIATRIC SURGERY STATUS 03/16/2007   Goiter 12/01/2006   Inguinal hernia 05/06/2006   Allergic rhinitis 04/06/2006   HYPERCHOLESTEROLEMIA 11/03/2005   Major depressive disorder, recurrent episode 11/03/2005   VARICOSE VEINS 11/03/2005   Diverticulitis of colon (without mention of hemorrhage)(562.11) 11/03/2005   Past Medical History:  Diagnosis Date   Asthma    mild intermittent   DDD (degenerative disc disease)    c spine   Diverticulosis    Hemorrhoids    Dr Benedetta   Hemorrhoids    History of gastric bypass    Thyroid  disease    Past Surgical History:  Procedure Laterality Date   APPENDECTOMY     DILATION AND CURETTAGE OF UTERUS     HEEL SPUR SURGERY     R/L   HERNIA REPAIR  2008   R inguinal hernia   lap gaastric bypass     LTCS     X 3    NEUROMA SURGERY     left   REDUCTION MAMMAPLASTY     TOTAL ABDOMINAL HYSTERECTOMY     w/o oophorectomy/ non cancerous   Family History  Problem Relation Age of Onset   Hypertension Father    Diabetes Father    Cancer Father        melanoma   Allergies  Allergen Reactions   Contrave  [Naltrexone -Bupropion  Hcl Er]     Dizziness, nausea, severe headache.    Phentermine      Increased blood pressure   Semaglutide  Other (See Comments)    Headache and nausea   Sulfa Antibiotics    Sulfonamide  Derivatives     REACTION: all sulfa meds causes hives      Patient Care Team: Marieme Mcmackin L, PA-C as PCP - General (Family Medicine)   Outpatient Medications Prior to Visit  Medication Sig   albuterol  (VENTOLIN  HFA) 108 (90 Base) MCG/ACT inhaler TAKE 2 PUFFS BY MOUTH EVERY 6 HOURS AS NEEDED FOR WHEEZE OR SHORTNESS OF BREATH   AMBULATORY NON FORMULARY MEDICATION Bilateral thigh-high medium compression graduated compression stockings. Diagnosis: Bilateral great saphenous varicose veins, venous insufficiency.   Ascorbic Acid (VITAMIN C) 1000 MG tablet Take 1,000 mg by mouth daily.   BD PEN NEEDLE NANO 2ND GEN 32G X 4 MM MISC USE AS DIRECTED   calcium citrate-vitamin D  (CITRACAL+D) 315-200 MG-UNIT tablet Take by mouth.   cetirizine (ZYRTEC) 10 MG tablet Take by mouth.   Cholecalciferol (VITAMIN D  PO) Take 5,000 Int'l Units by mouth daily.  diclofenac  Sodium (VOLTAREN ) 1 % GEL Apply 4 g topically 4 (four) times daily. To affected joint.   Ferrous Sulfate (IRON) 28 MG TABS Take 365 mg by mouth daily.   levothyroxine  (SYNTHROID ) 88 MCG tablet Take 1 tablet (88 mcg total) by mouth daily before breakfast.   Multiple Vitamin (MULTIVITAMIN) capsule Take by mouth.   ondansetron  (ZOFRAN -ODT) 8 MG disintegrating tablet Take 1 tablet (8 mg total) by mouth every 8 (eight) hours as needed for nausea.   valACYclovir  (VALTREX ) 1000 MG tablet Take 1 tablet (1,000 mg total) by mouth 2 (two) times daily.   No facility-administered medications prior to visit.    ROS  See HPI.       Objective:     BP (!) 131/91   Pulse 74   Ht 5' 7 (1.702 m)   Wt 233 lb (105.7 kg)   SpO2 99%   BMI 36.49 kg/m  BP Readings from Last 3 Encounters:  11/01/23 (!) 131/91  03/25/22 (!) 135/59  10/01/21 129/72   Wt Readings from Last 3 Encounters:  11/01/23 233 lb (105.7 kg)  03/25/22 201 lb (91.2 kg)  10/01/21 203 lb (92.1 kg)      Physical Exam  BP (!) 131/91   Pulse 74   Ht 5' 7 (1.702 m)   Wt  233 lb (105.7 kg)   SpO2 99%   BMI 36.49 kg/m   General Appearance:    Alert, cooperative, obese no distress, appears stated age  Head:    Normocephalic, without obvious abnormality, atraumatic  Eyes:    PERRL, conjunctiva/corneas clear, EOM's intact, fundi    benign, both eyes  Ears:    Normal TM's and external ear canals, both ears  Nose:   Nares normal, septum midline, mucosa normal, no drainage    or sinus tenderness  Throat:   Lips, mucosa, and tongue normal; teeth and gums normal  Neck:   Supple, symmetrical, trachea midline, no adenopathy;    thyroid :  no enlargement/tenderness/nodules; no carotid   bruit or JVD  Back:     Symmetric, no curvature, ROM normal, no CVA tenderness  Lungs:     Clear to auscultation bilaterally, respirations unlabored  Chest Wall:    No tenderness or deformity   Heart:    Regular rate and rhythm, S1 and S2 normal, no murmur, rub   or gallop     Abdomen:     Soft, non-tender, bowel sounds active all four quadrants,    no masses, no organomegaly        Extremities:   Extremities normal, atraumatic, no cyanosis or edema  Pulses:   2+ and symmetric all extremities  Skin:   Skin color, texture, turgor normal, no rashes or lesions  Lymph nodes:   Cervical, supraclavicular, and axillary nodes normal  Neurologic:   CNII-XII intact, normal strength, sensation and reflexes    throughout      Assessment & Plan:    Routine Health Maintenance and Physical Exam  Immunization History  Administered Date(s) Administered   Influenza Whole 10/19/2007   Influenza, Seasonal, Injecte, Preservative Fre 10/26/2013, 10/25/2020   Influenza,inj,Quad PF,6+ Mos 10/02/2016, 10/21/2019, 10/25/2020   Influenza-Unspecified 10/26/2013, 10/27/2014, 10/25/2020, 10/25/2023   PFIZER(Purple Top)SARS-COV-2 Vaccination 01/13/2019, 02/02/2019, 09/23/2019   PPD Test 09/12/2014, 02/06/2020, 02/20/2020   Tdap 08/27/2010, 08/20/2020   Zoster Recombinant(Shingrix) 11/19/2018,  12/31/2018    Health Maintenance  Topic Date Due   Pneumococcal Vaccine: 50+ Years (1 of 2 - PCV) Never done  Mammogram  09/12/2022   COVID-19 Vaccine (4 - 2025-26 season) 11/17/2023 (Originally 09/27/2023)   Colonoscopy  09/03/2025   DTaP/Tdap/Td (3 - Td or Tdap) 08/21/2030   Influenza Vaccine  Completed   Hepatitis C Screening  Completed   HIV Screening  Completed   Zoster Vaccines- Shingrix  Completed   Hepatitis B Vaccines 19-59 Average Risk  Aged Out   HPV VACCINES  Aged Out   Meningococcal B Vaccine  Aged Out    Discussed health benefits of physical activity, and encouraged her to engage in regular exercise appropriate for her age and condition. Lemond Amato was seen today for annual exam.  Diagnoses and all orders for this visit:  Routine physical examination  Visit for screening mammogram -     MM 3D SCREENING MAMMOGRAM BILATERAL BREAST  OSA on CPAP -     Ambulatory referral to Sleep Studies  Excessive sleepiness -     Ambulatory referral to Sleep Studies  Class 2 severe obesity due to excess calories with serious comorbidity and body mass index (BMI) of 36.0 to 36.9 in adult    Assessment & Plan Adult Wellness Visit Routine adult wellness visit with normal lab results. - Order mammogram. - pap not indicated - agreed to pneumonia vaccine but will come back and get it another day - flu shot given at work - covid vaccine declined - reviewed labs already done with patient  Fatigue Chronic fatigue with no clear etiology. Labs normal. No depression. Stress and difficulty managing responsibilities noted.  Obstructive sleep apnea Obstructive sleep apnea with inadequate relief from CPAP therapy. Consideration of alternative treatments. Candidate for Inspire therapy. - Refer to specialist for evaluation of Inspire therapy.  Vitamin D  deficiency Vitamin D  levels below desired range. Current supplementation insufficient. - Double current Vitamin D   supplementation.  TSH to goal -refilled levothyroxine     Return in about 1 year (around 10/31/2024).     Monserath Neff, PA-C

## 2023-11-01 NOTE — Patient Instructions (Signed)

## 2023-11-02 ENCOUNTER — Encounter: Payer: Self-pay | Admitting: Physician Assistant

## 2023-11-02 DIAGNOSIS — G4710 Hypersomnia, unspecified: Secondary | ICD-10-CM | POA: Insufficient documentation

## 2023-11-02 MED ORDER — LEVOTHYROXINE SODIUM 88 MCG PO TABS: 88.0000 ug | ORAL_TABLET | Freq: Every day | ORAL | 4 refills | Status: AC

## 2023-12-10 ENCOUNTER — Other Ambulatory Visit (HOSPITAL_BASED_OUTPATIENT_CLINIC_OR_DEPARTMENT_OTHER): Payer: Self-pay

## 2023-12-10 ENCOUNTER — Encounter: Payer: Self-pay | Admitting: Physician Assistant

## 2023-12-10 MED ORDER — VORTIOXETINE HBR 5 MG PO TABS
5.0000 mg | ORAL_TABLET | Freq: Every day | ORAL | 1 refills | Status: DC
  Filled 2023-12-10: qty 30, 30d supply, fill #0
  Filled 2024-01-03: qty 30, 30d supply, fill #1

## 2023-12-18 ENCOUNTER — Other Ambulatory Visit: Payer: Self-pay | Admitting: Physician Assistant

## 2023-12-18 DIAGNOSIS — E039 Hypothyroidism, unspecified: Secondary | ICD-10-CM

## 2023-12-31 NOTE — Telephone Encounter (Signed)
 Med list shows that 90 day rx with 4 refills was sent on 10/17???

## 2024-01-03 ENCOUNTER — Other Ambulatory Visit (HOSPITAL_BASED_OUTPATIENT_CLINIC_OR_DEPARTMENT_OTHER): Payer: Self-pay

## 2024-01-12 ENCOUNTER — Encounter: Payer: Self-pay | Admitting: Physician Assistant

## 2024-01-13 ENCOUNTER — Ambulatory Visit

## 2024-01-31 ENCOUNTER — Ambulatory Visit (HOSPITAL_BASED_OUTPATIENT_CLINIC_OR_DEPARTMENT_OTHER)

## 2024-02-08 ENCOUNTER — Other Ambulatory Visit (HOSPITAL_BASED_OUTPATIENT_CLINIC_OR_DEPARTMENT_OTHER): Payer: Self-pay

## 2024-02-08 ENCOUNTER — Other Ambulatory Visit: Payer: Self-pay | Admitting: Physician Assistant

## 2024-02-08 MED ORDER — VORTIOXETINE HBR 5 MG PO TABS
5.0000 mg | ORAL_TABLET | Freq: Every day | ORAL | 1 refills | Status: AC
  Filled 2024-02-08: qty 30, 30d supply, fill #0

## 2024-02-29 ENCOUNTER — Ambulatory Visit (HOSPITAL_BASED_OUTPATIENT_CLINIC_OR_DEPARTMENT_OTHER)

## 2024-03-06 ENCOUNTER — Ambulatory Visit (HOSPITAL_BASED_OUTPATIENT_CLINIC_OR_DEPARTMENT_OTHER)

## 2024-03-14 ENCOUNTER — Ambulatory Visit (HOSPITAL_BASED_OUTPATIENT_CLINIC_OR_DEPARTMENT_OTHER): Admitting: Radiology

## 2024-03-14 ENCOUNTER — Ambulatory Visit (HOSPITAL_BASED_OUTPATIENT_CLINIC_OR_DEPARTMENT_OTHER)
